# Patient Record
Sex: Female | Born: 1935 | Race: White | Hispanic: No | Smoking: Never smoker
Health system: Southern US, Community
[De-identification: ages and names within clinical notes are randomized; demographics above are authoritative.]

## PROBLEM LIST (undated history)

## (undated) DIAGNOSIS — I10 Essential (primary) hypertension: Secondary | ICD-10-CM

## (undated) DIAGNOSIS — F329 Major depressive disorder, single episode, unspecified: Secondary | ICD-10-CM

## (undated) DIAGNOSIS — K529 Noninfective gastroenteritis and colitis, unspecified: Secondary | ICD-10-CM

## (undated) DIAGNOSIS — E785 Hyperlipidemia, unspecified: Secondary | ICD-10-CM

## (undated) DIAGNOSIS — F0390 Unspecified dementia without behavioral disturbance: Secondary | ICD-10-CM

## (undated) DIAGNOSIS — M1990 Unspecified osteoarthritis, unspecified site: Secondary | ICD-10-CM

## (undated) DIAGNOSIS — Z7901 Long term (current) use of anticoagulants: Secondary | ICD-10-CM

## (undated) DIAGNOSIS — K219 Gastro-esophageal reflux disease without esophagitis: Secondary | ICD-10-CM

## (undated) DIAGNOSIS — K449 Diaphragmatic hernia without obstruction or gangrene: Secondary | ICD-10-CM

## (undated) DIAGNOSIS — I313 Pericardial effusion (noninflammatory): Secondary | ICD-10-CM

## (undated) DIAGNOSIS — I3139 Other pericardial effusion (noninflammatory): Secondary | ICD-10-CM

## (undated) DIAGNOSIS — M549 Dorsalgia, unspecified: Secondary | ICD-10-CM

## (undated) DIAGNOSIS — Z8781 Personal history of (healed) traumatic fracture: Secondary | ICD-10-CM

## (undated) DIAGNOSIS — T07XXXA Unspecified multiple injuries, initial encounter: Secondary | ICD-10-CM

## (undated) DIAGNOSIS — I4891 Unspecified atrial fibrillation: Secondary | ICD-10-CM

## (undated) DIAGNOSIS — E079 Disorder of thyroid, unspecified: Secondary | ICD-10-CM

## (undated) DIAGNOSIS — J189 Pneumonia, unspecified organism: Secondary | ICD-10-CM

## (undated) DIAGNOSIS — R55 Syncope and collapse: Secondary | ICD-10-CM

## (undated) DIAGNOSIS — F32A Depression, unspecified: Secondary | ICD-10-CM

## (undated) HISTORY — DX: Unspecified multiple injuries, initial encounter: T07.XXXA

## (undated) HISTORY — DX: Pneumonia, unspecified organism: J18.9

## (undated) HISTORY — DX: Diaphragmatic hernia without obstruction or gangrene: K44.9

## (undated) HISTORY — DX: Noninfective gastroenteritis and colitis, unspecified: K52.9

## (undated) HISTORY — DX: Hyperlipidemia, unspecified: E78.5

## (undated) HISTORY — PX: ABDOMINAL HYSTERECTOMY: SHX81

## (undated) HISTORY — PX: APPENDECTOMY: SHX54

## (undated) HISTORY — DX: Long term (current) use of anticoagulants: Z79.01

## (undated) HISTORY — DX: Gastro-esophageal reflux disease without esophagitis: K21.9

---

## 2003-02-22 HISTORY — PX: COLONOSCOPY: SHX174

## 2009-01-06 ENCOUNTER — Ambulatory Visit (HOSPITAL_COMMUNITY): Admission: RE | Admit: 2009-01-06 | Discharge: 2009-01-06 | Payer: Self-pay | Admitting: Internal Medicine

## 2011-09-22 DIAGNOSIS — K529 Noninfective gastroenteritis and colitis, unspecified: Secondary | ICD-10-CM

## 2011-09-22 DIAGNOSIS — J189 Pneumonia, unspecified organism: Secondary | ICD-10-CM

## 2011-09-22 HISTORY — DX: Pneumonia, unspecified organism: J18.9

## 2011-09-22 HISTORY — DX: Noninfective gastroenteritis and colitis, unspecified: K52.9

## 2011-09-27 ENCOUNTER — Inpatient Hospital Stay (HOSPITAL_COMMUNITY)
Admission: EM | Admit: 2011-09-27 | Discharge: 2011-10-04 | DRG: 871 | Disposition: A | Discharge status: Home / Self Care | Payer: Medicare Other | Attending: Internal Medicine | Admitting: Internal Medicine

## 2011-09-27 ENCOUNTER — Emergency Department (HOSPITAL_COMMUNITY): Payer: Medicare Other

## 2011-09-27 ENCOUNTER — Encounter (HOSPITAL_COMMUNITY): Payer: Self-pay | Admitting: *Deleted

## 2011-09-27 ENCOUNTER — Inpatient Hospital Stay (HOSPITAL_COMMUNITY)
Admission: EM | Admit: 2011-09-27 | Payer: Medicare Other | Source: Other Acute Inpatient Hospital | Admitting: Internal Medicine

## 2011-09-27 DIAGNOSIS — E872 Acidosis, unspecified: Secondary | ICD-10-CM

## 2011-09-27 DIAGNOSIS — I959 Hypotension, unspecified: Secondary | ICD-10-CM

## 2011-09-27 DIAGNOSIS — D689 Coagulation defect, unspecified: Secondary | ICD-10-CM

## 2011-09-27 DIAGNOSIS — T45515A Adverse effect of anticoagulants, initial encounter: Secondary | ICD-10-CM | POA: Diagnosis present

## 2011-09-27 DIAGNOSIS — I4891 Unspecified atrial fibrillation: Secondary | ICD-10-CM

## 2011-09-27 DIAGNOSIS — R197 Diarrhea, unspecified: Secondary | ICD-10-CM | POA: Diagnosis present

## 2011-09-27 DIAGNOSIS — I10 Essential (primary) hypertension: Secondary | ICD-10-CM | POA: Diagnosis present

## 2011-09-27 DIAGNOSIS — E876 Hypokalemia: Secondary | ICD-10-CM | POA: Diagnosis present

## 2011-09-27 DIAGNOSIS — G9340 Encephalopathy, unspecified: Secondary | ICD-10-CM

## 2011-09-27 DIAGNOSIS — Z7901 Long term (current) use of anticoagulants: Secondary | ICD-10-CM

## 2011-09-27 DIAGNOSIS — Z23 Encounter for immunization: Secondary | ICD-10-CM

## 2011-09-27 DIAGNOSIS — K5900 Constipation, unspecified: Secondary | ICD-10-CM | POA: Diagnosis present

## 2011-09-27 DIAGNOSIS — K5289 Other specified noninfective gastroenteritis and colitis: Secondary | ICD-10-CM | POA: Diagnosis present

## 2011-09-27 DIAGNOSIS — R6510 Systemic inflammatory response syndrome (SIRS) of non-infectious origin without acute organ dysfunction: Secondary | ICD-10-CM

## 2011-09-27 DIAGNOSIS — R578 Other shock: Secondary | ICD-10-CM | POA: Diagnosis present

## 2011-09-27 DIAGNOSIS — K529 Noninfective gastroenteritis and colitis, unspecified: Secondary | ICD-10-CM | POA: Diagnosis present

## 2011-09-27 DIAGNOSIS — E039 Hypothyroidism, unspecified: Secondary | ICD-10-CM | POA: Diagnosis present

## 2011-09-27 DIAGNOSIS — R109 Unspecified abdominal pain: Secondary | ICD-10-CM

## 2011-09-27 DIAGNOSIS — R6521 Severe sepsis with septic shock: Secondary | ICD-10-CM

## 2011-09-27 DIAGNOSIS — E860 Dehydration: Secondary | ICD-10-CM

## 2011-09-27 DIAGNOSIS — A419 Sepsis, unspecified organism: Principal | ICD-10-CM | POA: Diagnosis present

## 2011-09-27 HISTORY — DX: Essential (primary) hypertension: I10

## 2011-09-27 HISTORY — DX: Unspecified osteoarthritis, unspecified site: M19.90

## 2011-09-27 HISTORY — DX: Unspecified atrial fibrillation: I48.91

## 2011-09-27 LAB — CBC WITH DIFFERENTIAL/PLATELET
Basophils Relative: 0 % (ref 0–1)
Eosinophils Relative: 1 % (ref 0–5)
HCT: 37.7 % (ref 36.0–46.0)
Hemoglobin: 12.9 g/dL (ref 12.0–15.0)
Lymphocytes Relative: 13 % (ref 12–46)
MCHC: 34.2 g/dL (ref 30.0–36.0)
Monocytes Relative: 2 % — ABNORMAL LOW (ref 3–12)
Neutro Abs: 12.6 10*3/uL — ABNORMAL HIGH (ref 1.7–7.7)
RBC: 4.1 MIL/uL (ref 3.87–5.11)
WBC: 14.9 10*3/uL — ABNORMAL HIGH (ref 4.0–10.5)

## 2011-09-27 LAB — BLOOD GAS, ARTERIAL
TCO2: 14.7 mmol/L (ref 0–100)
pCO2 arterial: 32.3 mmHg — ABNORMAL LOW (ref 35.0–45.0)
pH, Arterial: 7.319 — ABNORMAL LOW (ref 7.350–7.450)
pO2, Arterial: 231 mmHg — ABNORMAL HIGH (ref 80.0–100.0)

## 2011-09-27 LAB — COMPREHENSIVE METABOLIC PANEL
ALT: 15 U/L (ref 0–35)
AST: 23 U/L (ref 0–37)
Albumin: 3.3 g/dL — ABNORMAL LOW (ref 3.5–5.2)
CO2: 17 mEq/L — ABNORMAL LOW (ref 19–32)
Chloride: 104 mEq/L (ref 96–112)
GFR calc non Af Amer: 56 mL/min — ABNORMAL LOW (ref >90)
Sodium: 138 mEq/L (ref 135–145)
Total Bilirubin: 0.4 mg/dL (ref 0.3–1.2)

## 2011-09-27 LAB — URINALYSIS, ROUTINE W REFLEX MICROSCOPIC
Bilirubin Urine: NEGATIVE
Hgb urine dipstick: NEGATIVE
Nitrite: NEGATIVE
Specific Gravity, Urine: 1.02 (ref 1.005–1.030)
Urobilinogen, UA: 0.2 mg/dL (ref 0.0–1.0)
pH: 7 (ref 5.0–8.0)

## 2011-09-27 LAB — PROCALCITONIN: Procalcitonin: <0.1 ng/mL

## 2011-09-27 LAB — URINE MICROSCOPIC-ADD ON

## 2011-09-27 LAB — PROTIME-INR
INR: 2.11 — ABNORMAL HIGH (ref 0.00–1.49)
Prothrombin Time: 24 seconds — ABNORMAL HIGH (ref 11.6–15.2)

## 2011-09-27 LAB — TROPONIN I: Troponin I: <0.3 ng/mL (ref <0.30)

## 2011-09-27 LAB — TYPE AND SCREEN

## 2011-09-27 MED ORDER — POTASSIUM CHLORIDE 10 MEQ/100ML IV SOLN
10.0000 meq | Freq: Once | INTRAVENOUS | Status: AC
  Administered 2011-09-27: 10 meq via INTRAVENOUS
  Filled 2011-09-27: qty 100

## 2011-09-27 MED ORDER — SODIUM CHLORIDE 0.9 % IV BOLUS (SEPSIS)
1000.0000 mL | Freq: Once | INTRAVENOUS | Status: AC
  Administered 2011-09-27: 1000 mL via INTRAVENOUS

## 2011-09-27 MED ORDER — PIPERACILLIN-TAZOBACTAM 3.375 G IVPB
3.3750 g | Freq: Once | INTRAVENOUS | Status: AC
  Administered 2011-09-27: 3.375 g via INTRAVENOUS
  Filled 2011-09-27: qty 50

## 2011-09-27 MED ORDER — ONDANSETRON HCL 4 MG/2ML IJ SOLN
4.0000 mg | Freq: Once | INTRAMUSCULAR | Status: AC
  Administered 2011-09-27: 4 mg via INTRAVENOUS
  Filled 2011-09-27: qty 2

## 2011-09-27 MED ORDER — POTASSIUM CHLORIDE CRYS ER 20 MEQ PO TBCR
40.0000 meq | EXTENDED_RELEASE_TABLET | Freq: Once | ORAL | Status: DC
  Filled 2011-09-27: qty 2

## 2011-09-27 MED ORDER — FENTANYL CITRATE 0.05 MG/ML IJ SOLN
50.0000 ug | Freq: Once | INTRAMUSCULAR | Status: AC
  Administered 2011-09-27: 50 ug via INTRAVENOUS

## 2011-09-27 MED ORDER — ONDANSETRON HCL 4 MG/2ML IJ SOLN
INTRAMUSCULAR | Status: AC
  Filled 2011-09-27: qty 2

## 2011-09-27 MED ORDER — FENTANYL CITRATE 0.05 MG/ML IJ SOLN
INTRAMUSCULAR | Status: AC
  Administered 2011-09-27: 50 ug via INTRAVENOUS
  Filled 2011-09-27: qty 2

## 2011-09-27 MED ORDER — VANCOMYCIN HCL IN DEXTROSE 1-5 GM/200ML-% IV SOLN
1000.0000 mg | Freq: Once | INTRAVENOUS | Status: AC
  Administered 2011-09-27: 1000 mg via INTRAVENOUS
  Filled 2011-09-27: qty 200

## 2011-09-27 NOTE — ED Notes (Signed)
Patient expresses desire to not be placed on life support in the event of a code. Family members state she has a living will, still no urine output.

## 2011-09-27 NOTE — ED Notes (Signed)
Family members at bedside now, patient is calmer at the moment

## 2011-09-27 NOTE — ED Provider Notes (Signed)
History  This chart was scribed for Glynn Octave, MD by Erskine Emery. This patient was seen in room APA02/APA02 and the patient's care was started at 21:42.    CSN: [id number]  Arrival date & time 09/27/11  2131   None    Level 5 Caveat--Mental Status  Chief Complaint  Patient presents with  . Abdominal Pain    (Consider location/radiation/quality/duration/timing/severity/associated sxs/prior treatment) HPI Level 5 Caveat--Mental Status Tamara [name] is a 78 y.o. female brought in by ambulance, who presents to the Emergency Department complaining of sudden onset emesis, diarrhea, lower abdominal pain and low blood pressure. Pt denies any associated back pain. EMS reports her blood pressure was low upon arrival, about 70/50 but raised to 80/60 after 500 cc bolus fluids. Pt claims she has never had pain like this before. Pt was given no pain medication PTA. Pt has a h/o hysterectomy but no other surgeries or medical issues that we know of. Pt reports she does take Coumadin but does not take Aspirin. Dr. Margo Aye is the pt's PCP.    Past Medical History  Diagnosis Date  . Hypertension   . Atrial fibrillation     Past Surgical History  Procedure Date  . Abdominal hysterectomy     No family history on file.  History  Substance Use Topics  . Smoking status: Unknown If Ever Smoked  . Smokeless tobacco: Not on file  . Alcohol Use: No    OB History    Grav Para Term Preterm Abortions TAB SAB Ect Mult Living                  Review of Systems A complete 10 system review of systems was obtained and all systems are negative except as noted in the HPI and PMH.    Allergies  Review of patient's allergies indicates no known allergies.  Home Medications  No current outpatient prescriptions on file.  BP 110/51  Pulse 50  Temp 97.9 F (36.6 C) (Oral)  Resp 16  SpO2 100%  Physical Exam  Nursing note and vitals reviewed. Constitutional: She is oriented to person,  place, and time. She appears well-developed and well-nourished. She appears distressed.       Pt is moaning and uncomfortable, unable to provide history.  HENT:  Head: Normocephalic and atraumatic.  Eyes: EOM are normal.  Neck: Neck supple. No tracheal deviation present.  Cardiovascular: Normal rate.   Pulmonary/Chest: Effort normal and breath sounds normal. No respiratory distress. She has no wheezes. She has no rales.  Abdominal: Soft. She exhibits no distension. There is tenderness. There is guarding.       Distensed abdomen. Diffusely tender with guarding, worse in the lower quadrant. No obvious abdominal aortic aneurysm on ultrasound.   Musculoskeletal: She exhibits no edema.       Equal femoral pulses bilaterally. +2 dorsalis pedis pulses bilaterally.  Neurological: She is alert and oriented to person, place, and time.  Skin: Skin is warm and dry.  Psychiatric: She has a normal mood and affect.    ED Course  CENTRAL LINE Date/Time: 09/27/2011 11:56 PM Performed by: Glynn Octave Authorized by: Glynn Octave Consent: Verbal consent obtained. The procedure was performed in an emergent situation. Consent given by: patient and power of attorney Patient understanding: patient states understanding of the procedure being performed Patient consent: the patient's understanding of the procedure matches consent given Patient identity confirmed: verbally with patient Time out: Immediately prior to procedure a "time out" was called  to verify the correct patient, procedure, equipment, support staff and site/side marked as required. Indications: vascular access and central pressure monitoring Anesthesia: local infiltration Local anesthetic: lidocaine 1% without epinephrine Anesthetic total: 4 ml Patient sedated: no Preparation: skin prepped with 2% chlorhexidine Skin prep agent dried: skin prep agent completely dried prior to procedure Sterile barriers: all five maximum sterile barriers  used - cap, mask, sterile gown, sterile gloves, and large sterile sheet Hand hygiene: hand hygiene performed prior to central venous catheter insertion Location details: right internal jugular Patient position: Trendelenburg Catheter type: triple lumen Catheter size: 7.5 Fr Pre-procedure: landmarks identified Ultrasound guidance: yes Number of attempts: 1 Successful placement: yes Post-procedure: line sutured and dressing applied Assessment: blood return through all parts, free fluid flow and placement verified by x-ray Patient tolerance: Patient tolerated the procedure well with no immediate complications.   (including critical care time) DIAGNOSTIC STUDIES: Oxygen Saturation is 93% on room air, adequate by my interpretation.    COORDINATION OF CARE: 21:42--I evaluated the patient.  Labs Reviewed  CBC WITH DIFFERENTIAL - Abnormal; Notable for the following:    WBC 14.9 (*)     Neutrophils Relative 84 (*)     Monocytes Relative 2 (*)     Neutro Abs 12.6 (*)     All other components within normal limits  COMPREHENSIVE METABOLIC PANEL - Abnormal; Notable for the following:    Potassium 2.6 (*)     CO2 17 (*)     Glucose, Bld 232 (*)     Total Protein 5.6 (*)     Albumin 3.3 (*)     GFR calc non Af Amer 56 (*)     GFR calc Af Amer 64 (*)     All other components within normal limits  LACTIC ACID, PLASMA - Abnormal; Notable for the following:    Lactic Acid, Venous 5.7 (*)     All other components within normal limits  URINALYSIS, ROUTINE W REFLEX MICROSCOPIC - Abnormal; Notable for the following:    Glucose, UA 100 (*)     Protein, ur 100 (*)     All other components within normal limits  PROTIME-INR - Abnormal; Notable for the following:    Prothrombin Time 24.0 (*)     INR 2.11 (*)     All other components within normal limits  BLOOD GAS, ARTERIAL - Abnormal; Notable for the following:    pH, Arterial 7.319 (*)     pCO2 arterial 32.3 (*)     pO2, Arterial 231.0 (*)      Bicarbonate 16.1 (*)     Acid-base deficit 8.8 (*)     All other components within normal limits  URINE MICROSCOPIC-ADD ON - Abnormal; Notable for the following:    Squamous Epithelial / LPF FEW (*)     Bacteria, UA FEW (*)     Casts HYALINE CASTS (*)     All other components within normal limits  LIPASE, BLOOD  TROPONIN I  PROCALCITONIN  TYPE AND SCREEN  KETONES, QUALITATIVE  CULTURE, BLOOD (ROUTINE X 2)  CULTURE, BLOOD (ROUTINE X 2)  MRSA PCR SCREENING  LACTIC ACID, PLASMA  LACTIC ACID, PLASMA  LACTIC ACID, PLASMA   Ct Abdomen Pelvis Wo Contrast  09/27/2011  *RADIOLOGY REPORT*  Clinical Data: Severe abdominal pain.  Low blood pressure.  CT ABDOMEN AND PELVIS WITHOUT CONTRAST  Technique:  Multidetector CT imaging of the abdomen and pelvis was performed following the standard protocol without intravenous contrast.  Comparison:  None.  Findings: Minimal bilateral pleural effusions with atelectasis or infiltration in the lung bases.  Cardiac enlargement.  Coronary artery calcification.  Small esophageal hiatal hernia.  The unenhanced appearance of the liver, spleen, gallbladder, left adrenal gland, kidneys, and retroperitoneal lymph nodes is unremarkable.  There is a hypodense nodule in the right adrenal gland measuring 1.8 cm diameter consistent with an adenoma. Calcification of the abdominal aorta without aneurysm.  No retroperitoneal hematomas.  Diffusely stool filled colon without significant distension or wall thickening. 6 mm fat density lesion in the second portion of the duodenum consistent with lipoma.  The stomach and small bowel are not abnormally distended.  No free air or free fluid in the abdomen.  Prominent visceral adipose tissues.  Pelvis:  A the uterus appears to be surgically absent.  There is a right-sided pelvic mass measuring 3.2 x 4.2 cm, likely representing the right ovary.  This is a moderately enlarged for age and ultrasound should be considered for followup.  The  bladder is decompressed with Foley catheter.  No free air or free fluid in the abdomen.  No significant pelvic lymphadenopathy.  The appendix is not visualized.  Degenerative changes in the lumbar spine.  IMPRESSION:  Small esophageal hiatal hernia.  Minimal bilateral pleural effusions with basilar atelectasis or infiltration.  Right adrenal gland nodule.  Prominence of the right ovary.  Consider ultrasound for follow-up.  Diffusely stool filled colon without bowel obstruction.  Duodenal lipoma.  Original Report Authenticated By: Marlon Pel, M.D.   Dg Chest Portable 1 View  09/28/2011  *RADIOLOGY REPORT*  Clinical Data: Right central line placement.  PORTABLE CHEST - 1 VIEW  Comparison: None.  Findings: The right central venous catheter placed with tip over the low right atrium.  If positioning as above the SVC / RA junction is desired, the catheter could be withdrawn about 7.7 cm. No pneumothorax.  Shallow inspiration.  Mild cardiac enlargement with normal pulmonary vascularity.  Interstitial changes suggesting fibrosis. Calcified and tortuous aorta.  No blunting of costophrenic angles. Mediastinal contours appear intact.  IMPRESSION: Right central venous catheter placed with tip over the low right atrium, about 7.7 cm below the SVC / RA junction.  No pneumothorax.  Original Report Authenticated By: Marlon Pel, M.D.   Dg Abd Acute W/chest  09/27/2011  *RADIOLOGY REPORT*  Clinical Data: Abdominal pain, shortness of breath  ACUTE ABDOMEN SERIES (ABDOMEN 2 VIEW & CHEST 1 VIEW)  Comparison: None.  Findings: Mild cardiomegaly with vascular congestion and chronic interstitial changes.  Negative for CHF or pneumonia.  No effusion or pneumothorax.  no definite free air.  Nonobstructive bowel gas pattern.  There is stool distally in the sigmoid region.  Mild nonspecific gaseous distention in the right abdomen.  Limited exam because of positioning.  Degenerative changes of the spine and hips.  Pelvic  calcifications consistent with venous phleboliths. Calcification over the right iliac crest noted, nonspecific this could be an injection granuloma versus an appendicolith.  IMPRESSION: Cardiomegaly without CHF or pneumonia  Negative for obstruction or free air.  Original Report Authenticated By: Judie Petit. Ruel Favors, M.D.     1. Septic shock       MDM  Arrives via EMS from home with acute onset of abdominal pain, vomiting. TTP LLQ with diffuse guarding. FOBT neg. Aggressive IVF, labs, plain films to eval for free air.  No bowel perforation or free air seen on plain film. No AAA seen on bedside ultrasound. Distal pulses intact bilaterally.  Abdominal pain, hypotension with lactic acidosis. Concern for mesenteric ischemia, septic shock, microperforation, diverticulitis. Broad spectrum antibiotics begun and cultures obtained.  Family arrives and confirms patient has living will and is DNR.  No intubation or CPR.  Patient agrees with family. Chronic findings on CT scan (no contrast because obtained emergently) D/w critical care who accepts patient in transfer. Request CVL placed before transfer and continue fluid resuscitation up to six liters.   Date: 09/27/2011  Rate: 59  Rhythm: normal sinus rhythm and premature ventricular contractions (PVC)  QRS Axis: normal  Intervals: normal  ST/T Wave abnormalities: nonspecific ST/T changes  Conduction Disutrbances:none  Narrative Interpretation: P waves evident with dropped QRS complexes in pattern of 2nd degree AV block.  Old EKG Reviewed: none available   Date: 09/28/2011  Rate: 54  Rhythm: atrial fibrillation  QRS Axis: normal  Intervals: normal  ST/T Wave abnormalities: nonspecific ST/T changes  Conduction Disutrbances:none  Narrative Interpretation: now atrial fibrillation  Old EKG Reviewed: changes noted  EMERGENCY DEPARTMENT [place] ABD/AORTA EXAM Study: Limited Ultrasound of the Abdominal Aorta.  INDICATIONS:Hypotension, Unstable Vital  Signs, Abdominal pain and Age>55 Indication: Multiple views of the abdominal aorta are obtained from the diaphragmatic hiatus to the aortic bifurcation in transverse and sagittal planes with a multi- Frequency probe.  PERFORMED BY: Myself  IMAGES ARCHIVED?: No  FINDINGS: Free fluid absent  LIMITATIONS:  Bowel gas and Abdominal pain  INTERPRETATION:  No abdominal aortic aneurysm  COMMENT:  Limited by pain CRITICAL CARE Performed by: Glynn Octave   Total critical care time: 60  Critical care time was exclusive of separately billable procedures and treating other patients.  Critical care was necessary to treat or prevent imminent or life-threatening deterioration.  Critical care was time spent personally by me on the following activities: development of treatment plan with patient and/or surrogate as well as nursing, discussions with consultants, evaluation of patient's response to treatment, examination of patient, obtaining history from patient or surrogate, ordering and performing treatments and interventions, ordering and review of laboratory studies, ordering and review of radiographic studies, pulse oximetry and re-evaluation of patient's condition.  I personally performed the services described in this documentation, which was scribed in my presence.  The recorded information has been reviewed and considered.    Glynn Octave, MD 09/28/11 1018

## 2011-09-27 NOTE — ED Notes (Signed)
2nd set of blood cultures drawn 

## 2011-09-27 NOTE — Progress Notes (Signed)
Notified md of pt abg p02 of 231and  Sat of 99.3 on nrb mask

## 2011-09-28 ENCOUNTER — Encounter (HOSPITAL_COMMUNITY): Payer: Self-pay

## 2011-09-28 ENCOUNTER — Emergency Department (HOSPITAL_COMMUNITY): Payer: Medicare Other

## 2011-09-28 ENCOUNTER — Inpatient Hospital Stay (HOSPITAL_COMMUNITY): Payer: Medicare Other

## 2011-09-28 DIAGNOSIS — I959 Hypotension, unspecified: Secondary | ICD-10-CM

## 2011-09-28 DIAGNOSIS — R6510 Systemic inflammatory response syndrome (SIRS) of non-infectious origin without acute organ dysfunction: Secondary | ICD-10-CM

## 2011-09-28 DIAGNOSIS — R109 Unspecified abdominal pain: Secondary | ICD-10-CM

## 2011-09-28 DIAGNOSIS — I4891 Unspecified atrial fibrillation: Secondary | ICD-10-CM

## 2011-09-28 DIAGNOSIS — R6521 Severe sepsis with septic shock: Secondary | ICD-10-CM

## 2011-09-28 DIAGNOSIS — A419 Sepsis, unspecified organism: Principal | ICD-10-CM

## 2011-09-28 DIAGNOSIS — G9340 Encephalopathy, unspecified: Secondary | ICD-10-CM

## 2011-09-28 DIAGNOSIS — E872 Acidosis: Secondary | ICD-10-CM

## 2011-09-28 DIAGNOSIS — K529 Noninfective gastroenteritis and colitis, unspecified: Secondary | ICD-10-CM | POA: Diagnosis present

## 2011-09-28 DIAGNOSIS — E860 Dehydration: Secondary | ICD-10-CM

## 2011-09-28 LAB — CBC WITH DIFFERENTIAL/PLATELET
Basophils Relative: 0 % (ref 0–1)
Eosinophils Relative: 0 % (ref 0–5)
HCT: 38.9 % (ref 36.0–46.0)
Hemoglobin: 13.4 g/dL (ref 12.0–15.0)
Lymphocytes Relative: 2 % — ABNORMAL LOW (ref 12–46)
MCH: 31.5 pg (ref 26.0–34.0)
Neutro Abs: 14.1 10*3/uL — ABNORMAL HIGH (ref 1.7–7.7)
Neutrophils Relative %: 93 % — ABNORMAL HIGH (ref 43–77)
RBC: 4.26 MIL/uL (ref 3.87–5.11)

## 2011-09-28 LAB — BASIC METABOLIC PANEL
BUN: 19 mg/dL (ref 6–23)
CO2: 20 mEq/L (ref 19–32)
Calcium: 9.1 mg/dL (ref 8.4–10.5)
Chloride: 104 mEq/L (ref 96–112)
Creatinine, Ser: 0.83 mg/dL (ref 0.50–1.10)
GFR calc non Af Amer: 63 mL/min — ABNORMAL LOW (ref >90)
GFR calc non Af Amer: 75 mL/min — ABNORMAL LOW (ref >90)
Glucose, Bld: 214 mg/dL — ABNORMAL HIGH (ref 70–99)
Glucose, Bld: 99 mg/dL (ref 70–99)
Potassium: 3.5 mEq/L (ref 3.5–5.1)
Sodium: 141 mEq/L (ref 135–145)
Sodium: 138 mEq/L (ref 135–145)

## 2011-09-28 LAB — OCCULT BLOOD, POC DEVICE: Fecal Occult Bld: NEGATIVE

## 2011-09-28 LAB — GLUCOSE, CAPILLARY

## 2011-09-28 LAB — KETONES, QUALITATIVE: Acetone, Bld: NEGATIVE

## 2011-09-28 MED ORDER — FENTANYL CITRATE 0.05 MG/ML IJ SOLN
25.0000 ug | INTRAMUSCULAR | Status: DC | PRN
  Administered 2011-09-28 (×3): 50 ug via INTRAVENOUS
  Filled 2011-09-28: qty 2

## 2011-09-28 MED ORDER — PANTOPRAZOLE SODIUM 40 MG IV SOLR
40.0000 mg | Freq: Every day | INTRAVENOUS | Status: DC
  Administered 2011-09-28 – 2011-09-29 (×3): 40 mg via INTRAVENOUS
  Filled 2011-09-28 (×4): qty 40

## 2011-09-28 MED ORDER — SODIUM CHLORIDE 0.9 % IJ SOLN
10.0000 mL | Freq: Two times a day (BID) | INTRAMUSCULAR | Status: DC
  Administered 2011-09-29 (×2): 10 mL
  Administered 2011-10-03: 3 mL

## 2011-09-28 MED ORDER — POTASSIUM CHLORIDE 10 MEQ/50ML IV SOLN
10.0000 meq | INTRAVENOUS | Status: AC
  Administered 2011-09-28 (×5): 10 meq via INTRAVENOUS
  Filled 2011-09-28 (×5): qty 50

## 2011-09-28 MED ORDER — SODIUM CHLORIDE 0.9 % IV SOLN
INTRAVENOUS | Status: DC
  Administered 2011-09-28: 1000 mL via INTRAVENOUS
  Administered 2011-09-29: 01:00:00 via INTRAVENOUS
  Administered 2011-09-29: 20 mL/h via INTRAVENOUS
  Administered 2011-09-30: 14:00:00 via INTRAVENOUS

## 2011-09-28 MED ORDER — ONDANSETRON HCL 4 MG/2ML IJ SOLN
4.0000 mg | Freq: Four times a day (QID) | INTRAMUSCULAR | Status: DC | PRN
  Administered 2011-09-28: 4 mg via INTRAVENOUS
  Filled 2011-09-28: qty 2

## 2011-09-28 MED ORDER — POTASSIUM CHLORIDE 10 MEQ/50ML IV SOLN
10.0000 meq | Freq: Once | INTRAVENOUS | Status: AC
  Administered 2011-09-29: 10 meq via INTRAVENOUS
  Filled 2011-09-28: qty 50

## 2011-09-28 MED ORDER — PIPERACILLIN-TAZOBACTAM 3.375 G IVPB
3.3750 g | Freq: Three times a day (TID) | INTRAVENOUS | Status: DC
  Administered 2011-09-28 – 2011-10-02 (×12): 3.375 g via INTRAVENOUS
  Filled 2011-09-28 (×17): qty 50

## 2011-09-28 MED ORDER — SODIUM CHLORIDE 0.9 % IJ SOLN
10.0000 mL | INTRAMUSCULAR | Status: DC | PRN
  Administered 2011-09-28: 10 mL

## 2011-09-28 MED ORDER — POTASSIUM CHLORIDE 10 MEQ/50ML IV SOLN
10.0000 meq | INTRAVENOUS | Status: AC
  Administered 2011-09-28 (×2): 10 meq via INTRAVENOUS
  Filled 2011-09-28 (×4): qty 50

## 2011-09-28 MED ORDER — IOHEXOL 300 MG/ML  SOLN
20.0000 mL | INTRAMUSCULAR | Status: AC
  Administered 2011-09-28 (×2): 20 mL via ORAL

## 2011-09-28 MED ORDER — SODIUM CHLORIDE 0.9 % IV SOLN
400.0000 mg | Freq: Once | INTRAVENOUS | Status: AC
  Administered 2011-09-28: 400 mg via INTRAVENOUS
  Filled 2011-09-28 (×2): qty 4

## 2011-09-28 NOTE — H&P (Signed)
Name: Tamara [name] MRN: [id number] DOB: [birth year]    LOS: 1  Referring Provider:  [facility] Reason for Referral:  Suspected sepsis  PULMONARY / CRITICAL CARE MEDICINE  The patient is encephalopathic and unable to provide history, which was obtained for available medical records.  HPI:  78 yo with sudden onset emesis, diarrhea, lower abdominal pain and hypotension who initially presented to [facility] ED and was transferred to [facility] for further management.  Past Medical History  Diagnosis Date  . Hypertension   . Atrial fibrillation    Past Surgical History  Procedure Date  . Abdominal hysterectomy    Prior to Admission medications   Medication Sig Start Date End Date Taking? Authorizing Provider  acetaminophen (ARTHRITIS PAIN RELIEF) 650 MG CR tablet Take 650 mg by mouth daily as needed. For arthritis   Yes Historical Provider, MD  alendronate (FOSAMAX) 70 MG tablet Take 70 mg by mouth every 7 (seven) days. Take with a full glass of water on an empty stomach for the bones   Yes Historical Provider, MD  Calcium Carbonate-Vitamin D (CALCIUM 600 + D PO) Take 1 tablet by mouth daily. For bone health   Yes Historical Provider, MD  cetirizine (ZYRTEC) 10 MG tablet Take 10 mg by mouth daily. For allergies   Yes Historical Provider, MD  hydrochlorothiazide (HYDRODIURIL) 25 MG tablet Take 25 mg by mouth daily. For blood pressure   Yes Historical Provider, MD  levothyroxine (SYNTHROID, LEVOTHROID) 88 MCG tablet Take 88 mcg by mouth daily. For thyroid   Yes Historical Provider, MD  lisinopril (PRINIVIL,ZESTRIL) 10 MG tablet Take 10 mg by mouth daily. For blood pressure   Yes Historical Provider, MD  memantine (NAMENDA) 10 MG tablet Take 20 mg by mouth daily. For memory   Yes Historical Provider, MD  omeprazole (PRILOSEC) 40 MG capsule Take 40 mg by mouth daily. For heartburn   Yes Historical Provider, MD  polyethylene glycol powder (GLYCOLAX/MIRALAX) powder Take 17 g by mouth daily. For constipation    Yes Historical Provider, MD  potassium chloride SA (K-DUR,KLOR-CON) 20 MEQ tablet Take 40 mEq by mouth daily.   Yes Historical Provider, MD  simvastatin (ZOCOR) 40 MG tablet Take 40 mg by mouth at bedtime. For cholesterol   Yes Historical Provider, MD  verapamil (CALAN-SR) 240 MG CR tablet Take 240 mg by mouth 2 (two) times daily. For blood pressure   Yes Historical Provider, MD  warfarin (COUMADIN) 5 MG tablet Take 5 mg by mouth as directed. For A.FIB   Yes Historical Provider, MD   Allergies No Known Allergies  Family History No family history on file.  Social History  has an unknown smoking status. She does not have any smokeless tobacco history on file. She reports that she does not drink alcohol. Her drug history not on file.  Review Of Systems:  Unable to provide  Brief patient description:  78 yo with sudden onset emesis, diarrhea, lower abdominal pain and hypotension who initially presented to [facility] ED and was transferred to [facility] for further management.  Events Since Admission: 8/6 Presented to [facility] with sudden onset emesis, diarrhea, lower abdominal pain, transferred to [facility]  Current Status:  Vital Signs: Temp:  [97.9 F (36.6 C)] 97.9 F (36.6 C) (08/07 0138) Pulse Rate:  [33-83] 50  (08/07 0006) Resp:  [16-25] 16  (08/07 0006) BP: (91-116)/(37-66) 110/51 mmHg (08/07 0006) SpO2:  [93 %-100 %] 100 % (08/07 0006)  Physical Examination: General:  Somewhat uncomfortable Neuro:  Confused but cooperative, non focal, follows commands HEENT:  PERRL, dry membranes Neck:  Supple, no JVD Cardiovascular:  Irregular, controlled rate Lungs:  CTAB Abdomen:  Voluntary guarding, generalized tenderness, no rebound Musculoskeletal:  No edema, moves all extremities Skin:  Intact  Active Problems:  Hypotension  Dehydration  Abdominal pain  Atrial fibrillation  Coagulopathy  SIRS (systemic inflammatory response syndrome)  Encephalopathy acute  ASSESSMENT AND PLAN  PULMONARY  Lab  09/27/11 2228  PHART 7.319*  PCO2ART 32.3*  PO2ART 231.0*  HCO3 16.1*  O2SAT 99.3   Ventilator Settings:   CXR:  8/7 >>> Right central venous catheter placed with tip over the low right atrium, about 7.7 cm below the SVC / RA junction. No pneumothorax.  ETT:  NA  A:  No active issues. P:   Supplemental oxygen PRN Goal SpO2>92  CARDIOVASCULAR  Lab 09/27/11 2146  TROPONINI <0.30  LATICACIDVEN 5.7*  PROBNP --   ECG:  8/6 >>> Atrial fibrillation, controlled rate, nonspecific ST-T changes Lines: R IJ CVL 8/6 >>>  A: Hypotension on the background of volume depletion (emesis, diarrhea, diuretics, antihypertensives).  SIRS.  No evidence af acute ischemia.  Atrial fibrillation / controlled rate. P:  Hold Lisinopril / Verapamil IVF boluses Goal CVP 8-10 Trend Lactate  RENAL  Lab 09/27/11 2146  NA 138  K 2.6*  CL 104  CO2 17*  BUN 18  CREATININE 0.92  CALCIUM 10.0  MG --  PHOS --   Intake/Output    None    Foley:  8/6 >>>  A:  Hypokalemia.  Metabolic acidosis, mixed - lactic + bicarbonate loss.  Hypovolemia / dehydration. P:   Hold HCTZ Trend BMP IVF NS 100 mL/h K 40 x 3 prior to arrival  GASTROINTESTINAL  Lab 09/27/11 2146  AST 23  ALT 15  ALKPHOS 99  BILITOT 0.4  PROT 5.6*  ALBUMIN 3.3*   Abd CT:  8/6 >>> Small esophageal hiatal hernia. Minimal bilateral pleural effusions with basilar atelectasis or infiltration. Right adrenal gland nodule. Prominence of the right ovary. Consider ultrasound for follow-up. Diffusely stool filled colon without bowel  obstruction. Duodenal lipoma.  A:  Abdominal pain, nausea, vomiting. Gastroenteritis? P:   NPO Serial exams Amilase, Lypase Zofran PRN  HEMATOLOGIC  Lab 09/27/11 2146  HGB 12.9  HCT 37.7  PLT 179  INR 2.11*  APTT --   A:  Coumadin induced coagulopathy (atrial fibrillation). P:  Hold Coumadin for now for possible interventions  INFECTIOUS  Lab 09/27/11 2147 09/27/11 2146  WBC -- 14.9*    PROCALCITON <0.10 --   Cultures: 8/6  Blood >>>  Antibiotics: 8/6  Zosyn >>>  A:  No clear source of infection.  Low PCT reassuring. P:   ABx/Cx as above  ENDOCRINE No results found for this basename: GLUCAP:5 in the last 168 hours A:  Hypothyroidism. P:   Hold Synthroid  NEUROLOGIC  A:  Acute encephalopathy (septic, pain Rx).  Unknown baseline.  Dementia? Abdominal pain. P:   Monitor Hold Nemenda  Fentanyl PRN  BEST PRACTICE / DISPOSITION Level of Care:  ICU Primary Service:  PCCM Consultants:  None Code Status:  Full Diet:  NPO DVT Px:  Not indicated (therapeutic INR) + SCD GI Px:  Protonix Skin Integrity:  Intact Social / Family:  Not available  The patient is critically ill with multiple organ systems failure and requires high complexity decision making for assessment and support, frequent evaluation and titration of therapies, application of advanced monitoring technologies  and extensive interpretation of multiple databases. Critical Care Time devoted to patient care services described in this note is 30 minutes.  Lonia Farber, M.D. Pulmonary and Critical Care Medicine [facility] Pager: [phone]  09/28/2011, 1:42 AM

## 2011-09-28 NOTE — Progress Notes (Signed)
CBGs on 09/28/11  184-214 mg/dl.  CBGs greater than 180 mg/dl.  Check HgbA1C for average home blood glucose. Check CBGs every 4 hours while NPO and AC & HS when eating.   Smith Mince RN BSN CDE

## 2011-09-28 NOTE — Progress Notes (Signed)
Pt only received 4 kcl from 2100 when enquired from [place], [place].  Pt received only one unit on the floor after arrival.  Dr. Delford Field made aware instructed that pt need a total of 8 doses.  [place] in Pharmacy notified and order for rest of kcl placed.  Amanda Pea, Charity fundraiser.

## 2011-09-28 NOTE — ED Notes (Signed)
[facility] here to transport pt.   

## 2011-09-28 NOTE — H&P (Signed)
Name: TAWAN CORKERN MRN: [id number] DOB: 1925/02/20    LOS: 1  Referring Provider:  [facility] Reason for Referral:  Suspected sepsis  PULMONARY / CRITICAL CARE MEDICINE  Brief patient description:  78 yo with sudden onset emesis, diarrhea, lower abdominal pain and hypotension who initially presented to [facility] ED and was transferred to [facility] for further management.  Events Since Admission: 8/6 Presented to [facility] with sudden onset emesis, diarrhea, lower abdominal pain, transferred to [facility]  Current Status: BP better.  Still has abd pain, but localized to LLQ.  Vital Signs: Temp:  [97.7 F (36.5 C)-100 F (37.8 C)] 100 F (37.8 C) (08/07 0840) Pulse Rate:  [33-88] 88  (08/07 0800) Resp:  [16-28] 21  (08/07 0800) BP: (91-129)/(37-89) 101/82 mmHg (08/07 0800) SpO2:  [93 %-100 %] 99 % (08/07 0800) Weight:  [160 lb 4.4 oz (72.7 kg)] 160 lb 4.4 oz (72.7 kg) (08/07 0500)  Physical Examination: General:  No distress Neuro:  A&O x 3, normal strength HEENT:  PERRL, dry membranes Neck:  Supple, no JVD Cardiovascular:  Irregular, controlled rate Lungs:  CTAB Abdomen:  Tender LLQ Musculoskeletal:  No edema, moves all extremities Skin:  Intact  Ct Abdomen Pelvis Wo Contrast  09/27/2011  *RADIOLOGY REPORT*  Clinical Data: Severe abdominal pain.  Low blood pressure.  CT ABDOMEN AND PELVIS WITHOUT CONTRAST  Technique:  Multidetector CT imaging of the abdomen and pelvis was performed following the standard protocol without intravenous contrast.  Comparison: None.  Findings: Minimal bilateral pleural effusions with atelectasis or infiltration in the lung bases.  Cardiac enlargement.  Coronary artery calcification.  Small esophageal hiatal hernia.  The unenhanced appearance of the liver, spleen, gallbladder, left adrenal gland, kidneys, and retroperitoneal lymph nodes is unremarkable.  There is a hypodense nodule in the right adrenal gland measuring 1.8 cm diameter consistent with an adenoma. Calcification of  the abdominal aorta without aneurysm.  No retroperitoneal hematomas.  Diffusely stool filled colon without significant distension or wall thickening. 6 mm fat density lesion in the second portion of the duodenum consistent with lipoma.  The stomach and small bowel are not abnormally distended.  No free air or free fluid in the abdomen.  Prominent visceral adipose tissues.  Pelvis:  A the uterus appears to be surgically absent.  There is a right-sided pelvic mass measuring 3.2 x 4.2 cm, likely representing the right ovary.  This is a moderately enlarged for age and ultrasound should be considered for followup.  The bladder is decompressed with Foley catheter.  No free air or free fluid in the abdomen.  No significant pelvic lymphadenopathy.  The appendix is not visualized.  Degenerative changes in the lumbar spine.  IMPRESSION:  Small esophageal hiatal hernia.  Minimal bilateral pleural effusions with basilar atelectasis or infiltration.  Right adrenal gland nodule.  Prominence of the right ovary.  Consider ultrasound for follow-up.  Diffusely stool filled colon without bowel obstruction.  Duodenal lipoma.  Original Report Authenticated By: Marlon Pel, M.D.   Dg Chest Portable 1 View  09/28/2011  *RADIOLOGY REPORT*  Clinical Data: Right central line placement.  PORTABLE CHEST - 1 VIEW  Comparison: None.  Findings: The right central venous catheter placed with tip over the low right atrium.  If positioning as above the SVC / RA junction is desired, the catheter could be withdrawn about 7.7 cm. No pneumothorax.  Shallow inspiration.  Mild cardiac enlargement with normal pulmonary vascularity.  Interstitial changes suggesting fibrosis. Calcified and tortuous aorta.  No blunting of costophrenic angles. Mediastinal contours appear intact.  IMPRESSION: Right central venous catheter placed with tip over the low right atrium, about 7.7 cm below the SVC / RA junction.  No pneumothorax.  Original Report Authenticated  By: Marlon Pel, M.D.   Dg Abd Acute W/chest  09/27/2011  *RADIOLOGY REPORT*  Clinical Data: Abdominal pain, shortness of breath  ACUTE ABDOMEN SERIES (ABDOMEN 2 VIEW & CHEST 1 VIEW)  Comparison: None.  Findings: Mild cardiomegaly with vascular congestion and chronic interstitial changes.  Negative for CHF or pneumonia.  No effusion or pneumothorax.  no definite free air.  Nonobstructive bowel gas pattern.  There is stool distally in the sigmoid region.  Mild nonspecific gaseous distention in the right abdomen.  Limited exam because of positioning.  Degenerative changes of the spine and hips.  Pelvic calcifications consistent with venous phleboliths. Calcification over the right iliac crest noted, nonspecific this could be an injection granuloma versus an appendicolith.  IMPRESSION: Cardiomegaly without CHF or pneumonia  Negative for obstruction or free air.  Original Report Authenticated By: Judie Petit. Ruel Favors, M.D.    ASSESSMENT AND PLAN  PULMONARY  Lab 09/27/11 2228  PHART 7.319*  PCO2ART 32.3*  PO2ART 231.0*  HCO3 16.1*  O2SAT 99.3    A:  No active issues. P:   Supplemental oxygen PRN Goal SpO2>92  CARDIOVASCULAR  Lab 09/28/11 0540 09/27/11 2146  TROPONINI -- <0.30  LATICACIDVEN 4.8* 5.7*  PROBNP -- --   ECG:  8/6 >>> Atrial fibrillation, controlled rate, nonspecific ST-T changes Lines: R IJ CVL 8/6 >>>  A: Hypotension on the background of volume depletion (emesis, diarrhea, diuretics, antihypertensives)>>resolved P:  Hold Lisinopril / Verapamil Continue IV fluids   RENAL  Lab 09/28/11 0415 09/27/11 2146  NA 141 138  K 2.8* 2.6*  CL 106 104  CO2 20 17*  BUN 19 18  CREATININE 0.83 0.92  CALCIUM 9.1 10.0  MG -- --  PHOS -- --   Intake/Output      08/06 0701 - 08/07 0700 08/07 0701 - 08/08 0700   I.V. (mL/kg) 600 (8.3) 100 (1.4)   IV Piggyback 62 50   Total Intake(mL/kg) 662 (9.1) 150 (2.1)   Urine (mL/kg/hr) 500 (0.3) 150   Total Output 500 150   Net  +162 0         Foley:  8/6 >>>  A:  Hypokalemia.  Metabolic acidosis>>resolved. P:   Hold HCTZ Trend BMP IVF NS 100 mL/h Replace  GASTROINTESTINAL  Lab 09/27/11 2146  AST 23  ALT 15  ALKPHOS 99  BILITOT 0.4  PROT 5.6*  ALBUMIN 3.3*   Abd CT:  8/6 >>> Small esophageal hiatal hernia. Minimal bilateral pleural effusions with basilar atelectasis or infiltration. Right adrenal gland nodule. Prominence of the right ovary. Consider ultrasound for follow-up. Diffusely stool filled colon without bowel obstruction. Duodenal lipoma.  A:  Abdominal pain, nausea, vomiting.  Appreciate help from Surgery. P:   F/u Repeat CT abd/pelvis  HEMATOLOGIC  Lab 09/27/11 2146  HGB 12.9  HCT 37.7  PLT 179  INR 2.11*  APTT --   A:  Coumadin induced coagulopathy (atrial fibrillation). P:  Hold Coumadin for now for possible interventions  INFECTIOUS  Lab 09/27/11 2147 09/27/11 2146  WBC -- 14.9*  PROCALCITON <0.10 --   Cultures: 8/6  Blood >>>  Antibiotics: 8/6  Zosyn >>>  A:  Possible abd source of infection. P:   ABx/Cx as above  ENDOCRINE  Lab 09/28/11  0137  GLUCAP 184*   A:  Hypothyroidism. P:   Hold Synthroid  NEUROLOGIC  A:  Acute encephalopathy (septic, pain Rx)>>resolved. P:   Monitor Hold Nemenda  Fentanyl PRN  BEST PRACTICE / DISPOSITION Level of Care:  Transfer to telemetry Primary Service: Will ask [facility] to assume care from 8/08 and PCCM sign off Consultants:  CCS Code Status:  Full Diet:  NPO DVT Px:  Not indicated (therapeutic INR) + SCD GI Px:  Protonix Skin Integrity:  Intact Social / Family:  Updated family at bedside  Coralyn Helling, MD [facility] Pulmonary/Critical Care 09/28/2011, 10:14 AM Pager:  [phone] After 3pm call: [phone]

## 2011-09-28 NOTE — Progress Notes (Signed)
78yo female transferred from [facility] for suspected sepsis/SIRS with no clear source of infection given sudden-onset emesis, diarrhea, lower abdominal pain, and hypotension, to begin IV ABX.  Will begin Zosyn 3.375g IV Q8H and monitor CBC, Cx, further management plans.  Vernard Gambles, PharmD, BCPS 09/28/2011 2:52 AM

## 2011-09-28 NOTE — Progress Notes (Signed)
Patient reexamined.  Reports less abdominal pain and no nausea. Of concern is continuing abdominal tenderness on exam and some abdominal rigidity that was not present upon initial examination.  Discussed with Dr. Ezzard Standing (Surgery) who will come and examine patient shortly.  Orlean Bradford, M.D., F.C.C.P. Pulmonary and Critical Care Medicine [facility] Cell: [phone] Pager: [phone]

## 2011-09-28 NOTE — Progress Notes (Signed)
Unable to start FFP as pt is currently off the floor for CT scan as planned prior to transfer to [id number].  Jettie Pagan NP made aware that FFP not started yet as a result and to start when pt is back on the floor.  Amanda Pea, Charity fundraiser.

## 2011-09-28 NOTE — Progress Notes (Signed)
[facility] ADULT ICU REPLACEMENT PROTOCOL FOR AM LAB REPLACEMENT ONLY  The patient does apply for the [facility] Adult ICU Electrolyte Replacment Protocol based on the criteria listed below:   1. Is GFR >/= 50 ml/min? yes  Patient's GFR today is 73 2. Is urine output >/= 0.5 ml/kg/hr for the last 8 hours? yes Patient's UOP is 1.18 ml/kg/hr 3. Is BUN < 30 mg/dL? yes  Patient's BUN today is 19 4. Abnormal electrolyte(s): K(2.8) 5. Ordered repletion with:67meq  6. If a panic level lab has been reported, has the CCM MD in charge been notified? yes.   Physician:  Rosaura Carpenter 09/28/2011 6:27 AM

## 2011-09-28 NOTE — Progress Notes (Signed)
eLink Physician-Brief Progress Note Patient Name: Tamara [name] DOB: 1924-10-21 MRN: [id number]  Date of Service  09/28/2011   HPI/Events of Note  RN calling elink   - HR 150 again and bp fine and RN thinks this might be due to FFP  - EKG - 130 and sinus tach   eICU Interventions  Transfer to ICU on account of tachycardia, multiple calls needing closer observation     Intervention Category Major Interventions: Other:  Hong Timm 09/28/2011, 11:47 PM

## 2011-09-28 NOTE — Progress Notes (Signed)
Pt HR 150 prior to start of FFP and 145 after initial tx.  Dr Delford Field be aware and instructed to call ordering MD.  Dr Lindie Spruce informed of pulse and also temp running 100.1 at 1800 and 100.7 at 1845.  Dr. Lindie Spruce instructed it was ok to continue to give FFP.  Dr Delford Field made aware of Dr. Lindie Spruce instruction to continue with FFP.  Nyajah Hyson,RN.

## 2011-09-28 NOTE — Progress Notes (Signed)
Verify with Jettie Pagan that FFP orders to be given on floor as was questioned by [facility] by Aggie Cosier.  Informed Lafonda Mosses in  [facility] that FFP required.  Amanda Pea, Charity fundraiser.

## 2011-09-28 NOTE — ED Notes (Signed)
Fentanyl 50mcg given

## 2011-09-28 NOTE — Consult Note (Signed)
JEANNEMARIE SAWAYA [birth year]  [id number].   PCP: Dr. Dwana Melena Requesting MD: Dr. Lonia Farber Chief Complaint/Reason for Consult: abdominal pain HPI: This is an 78 yo WF who presented to [facility] last night with generalized malaise, chills, diaphoresis, and some abdominal pain.  She states around 19:30 last night that this all started with excessive N/V.  She had 3 BMs during this time as well, however, the patient denies diarrhea.  Of note, the patient does admit to chronic laxative use on a daily basis.  (Colonoscopy about 10 yrs ago.)  She had a workup done at [facility], including a CT scan that revealed a colon full of stool and a right pelvic mass that is around 3x4 cm that appears to be her right ovary.  She was mildly hypotensive at [facility] with an elevated lactic acid.    She was then transferred to [facility] for further management.  Apparently within, the last 4 hrs her pain in her abdominal has localized and become more severe in her LLQ.  She continues to have a lactic acidosis with a lactate level of 4.8.  She remains nauseated.  She has not had a repeat CBC or films this morning.  Due to worsening pain, we were asked to see the patient for further evaluation.  According to daughter in law, the patient fell at home about one week ago, but I do not see how that is related to this acute event.  Review of Systems:   Please see HPI, otherwise all other systems have been reviewed and are negative.  History reviewed. No pertinent family history.  Past Medical History  Diagnosis Date  . Hypertension   . Atrial fibrillation   . Osteoporosis   . Arthritis     Past Surgical History  Procedure Date  . Abdominal hysterectomy   . Appendectomy     Social History:  reports that she has never smoked. She does not have any smokeless tobacco history on file. She reports that she does not drink alcohol. Her drug history not on file.  Allergies: No Known Allergies  Medications Prior to Admission    Medication Sig Dispense Refill  . acetaminophen (ARTHRITIS PAIN RELIEF) 650 MG CR tablet Take 650 mg by mouth daily as needed. For arthritis      . alendronate (FOSAMAX) 70 MG tablet Take 70 mg by mouth every 7 (seven) days. Take with a full glass of water on an empty stomach for the bones      . Calcium Carbonate-Vitamin D (CALCIUM 600 + D PO) Take 1 tablet by mouth daily. For bone health      . cetirizine (ZYRTEC) 10 MG tablet Take 10 mg by mouth daily. For allergies      . hydrochlorothiazide (HYDRODIURIL) 25 MG tablet Take 25 mg by mouth daily. For blood pressure      . levothyroxine (SYNTHROID, LEVOTHROID) 88 MCG tablet Take 88 mcg by mouth daily. For thyroid      . lisinopril (PRINIVIL,ZESTRIL) 10 MG tablet Take 10 mg by mouth daily. For blood pressure      . memantine (NAMENDA) 10 MG tablet Take 20 mg by mouth daily. For memory      . omeprazole (PRILOSEC) 40 MG capsule Take 40 mg by mouth daily. For heartburn      . polyethylene glycol powder (GLYCOLAX/MIRALAX) powder Take 17 g by mouth daily. For constipation      . potassium chloride SA (K-DUR,KLOR-CON) 20 MEQ tablet Take 40 mEq by  mouth daily.      . simvastatin (ZOCOR) 40 MG tablet Take 40 mg by mouth at bedtime. For cholesterol      . verapamil (CALAN-SR) 240 MG CR tablet Take 240 mg by mouth 2 (two) times daily. For blood pressure      . warfarin (COUMADIN) 5 MG tablet Take 5 mg by mouth as directed. For A.FIB        Blood pressure 129/66, pulse 88, temperature 97.7 F (36.5 C), temperature source Oral, resp. rate 25, height 5\' 10"  (1.778 m), weight 160 lb 4.4 oz (72.7 kg), SpO2 100.00%. Physical Exam: General: pleasant, WD, WN white female who is laying in bed in minimal distress HEENT: head is normocephalic, atraumatic.  Sclera are noninjected.  PERRL.  Ears and nose without any masses or lesions.  Mouth is pink and moist Heart: regular, rate, and rhythm.  Normal s1,s2. No obvious murmurs, gallops, or rubs noted.  Palpable  radial and pedal pulses bilaterally, currently in NSR. Lungs: CTAB, no wheezes, rhonchi, or rales noted.  Respiratory effort nonlabored Abd: soft, moderately tender in the LLQ and suprapubic region.  It does feels more full in this region.  Hypoactive BS with mild distention, no masses, hernias, or organomegaly noted. MS: all 4 extremities are symmetrical with no cyanosis, clubbing, or edema. Skin: warm and dry with no masses, lesions, or rashes Psych: A&Ox3 with an appropriate affect.    Results for orders placed during the hospital encounter of 09/27/11 (from the past 48 hour(s))  CBC WITH DIFFERENTIAL     Status: Abnormal   Collection Time   09/27/11  9:46 PM      Component Value Range Comment   WBC 14.9 (*) 4.0 - 10.5 K/uL    RBC 4.10  3.87 - 5.11 MIL/uL    Hemoglobin 12.9  12.0 - 15.0 g/dL    HCT 16.1  09.6 - 04.5 %    MCV 92.0  78.0 - 100.0 fL    MCH 31.5  26.0 - 34.0 pg    MCHC 34.2  30.0 - 36.0 g/dL    RDW 40.9  81.1 - 91.4 %    Platelets 179  150 - 400 K/uL CORRECTED ON 08/06 AT 2225: PREVIOUSLY REPORTED AS HIDE   Neutrophils Relative 84 (*) 43 - 77 %    Lymphocytes Relative 13  12 - 46 %    Monocytes Relative 2 (*) 3 - 12 %    Eosinophils Relative 1  0 - 5 %    Basophils Relative 0  0 - 1 %    Neutro Abs 12.6 (*) 1.7 - 7.7 K/uL    Lymphs Abs 1.9  0.7 - 4.0 K/uL    Monocytes Absolute 0.3  0.1 - 1.0 K/uL    Eosinophils Absolute 0.1  0.0 - 0.7 K/uL    Basophils Absolute 0.0  0.0 - 0.1 K/uL    RBC Morphology SPHEROCYTES      WBC Morphology TOXIC GRANULATION      Smear Review LARGE PLATELETS PRESENT   PLATELET COUNT CONFIRMED BY SMEAR  COMPREHENSIVE METABOLIC PANEL     Status: Abnormal   Collection Time   09/27/11  9:46 PM      Component Value Range Comment   Sodium 138  135 - 145 mEq/L    Potassium 2.6 (*) 3.5 - 5.1 mEq/L    Chloride 104  96 - 112 mEq/L    CO2 17 (*) 19 - 32 mEq/L    Glucose,  Bld 232 (*) 70 - 99 mg/dL    BUN 18  6 - 23 mg/dL    Creatinine, Ser 1.19   0.50 - 1.10 mg/dL    Calcium 14.7  8.4 - 10.5 mg/dL    Total Protein 5.6 (*) 6.0 - 8.3 g/dL    Albumin 3.3 (*) 3.5 - 5.2 g/dL    AST 23  0 - 37 U/L    ALT 15  0 - 35 U/L    Alkaline Phosphatase 99  39 - 117 U/L    Total Bilirubin 0.4  0.3 - 1.2 mg/dL    GFR calc non Af Amer 56 (*) >90 mL/min    GFR calc Af Amer 64 (*) >90 mL/min   LIPASE, BLOOD     Status: Normal   Collection Time   09/27/11  9:46 PM      Component Value Range Comment   Lipase 45  11 - 59 U/L   LACTIC ACID, PLASMA     Status: Abnormal   Collection Time   09/27/11  9:46 PM      Component Value Range Comment   Lactic Acid, Venous 5.7 (*) 0.5 - 2.2 mmol/L   TROPONIN I     Status: Normal   Collection Time   09/27/11  9:46 PM      Component Value Range Comment   Troponin I <0.30  <0.30 ng/mL   PROTIME-INR     Status: Abnormal   Collection Time   09/27/11  9:46 PM      Component Value Range Comment   Prothrombin Time 24.0 (*) 11.6 - 15.2 seconds    INR 2.11 (*) 0.00 - 1.49   PROCALCITONIN     Status: Normal   Collection Time   09/27/11  9:47 PM      Component Value Range Comment   Procalcitonin <0.10     TYPE AND SCREEN     Status: Normal   Collection Time   09/27/11  9:49 PM      Component Value Range Comment   ABO/RH(D) B POS      Antibody Screen NEG      Sample Expiration 09/30/2011     BLOOD GAS, ARTERIAL     Status: Abnormal   Collection Time   09/27/11 10:28 PM      Component Value Range Comment   O2 Content 100.0      Delivery systems OXYGEN MASK      pH, Arterial 7.319 (*) 7.350 - 7.450    pCO2 arterial 32.3 (*) 35.0 - 45.0 mmHg    pO2, Arterial 231.0 (*) 80.0 - 100.0 mmHg    Bicarbonate 16.1 (*) 20.0 - 24.0 mEq/L    TCO2 14.7  0 - 100 mmol/L    Acid-base deficit 8.8 (*) 0.0 - 2.0 mmol/L    O2 Saturation 99.3      Patient temperature 37.0      Collection site RIGHT RADIAL      Drawn by [id number]      Sample type ARTERIAL      Allens test (pass/fail) PASS  PASS   KETONES, QUALITATIVE     Status: Normal    Collection Time   09/27/11 10:36 PM      Component Value Range Comment   Acetone, Bld NEGATIVE  NEGATIVE   URINALYSIS, ROUTINE W REFLEX MICROSCOPIC     Status: Abnormal   Collection Time   09/27/11 10:44 PM      Component Value Range Comment  Color, Urine YELLOW  YELLOW    APPearance CLEAR  CLEAR    Specific Gravity, Urine 1.020  1.005 - 1.030    pH 7.0  5.0 - 8.0    Glucose, UA 100 (*) NEGATIVE mg/dL    Hgb urine dipstick NEGATIVE  NEGATIVE    Bilirubin Urine NEGATIVE  NEGATIVE    Ketones, ur NEGATIVE  NEGATIVE mg/dL    Protein, ur 782 (*) NEGATIVE mg/dL    Urobilinogen, UA 0.2  0.0 - 1.0 mg/dL    Nitrite NEGATIVE  NEGATIVE    Leukocytes, UA NEGATIVE  NEGATIVE   URINE MICROSCOPIC-ADD ON     Status: Abnormal   Collection Time   09/27/11 10:44 PM      Component Value Range Comment   Squamous Epithelial / LPF FEW (*) RARE    WBC, UA 3-6  <3 WBC/hpf    Bacteria, UA FEW (*) RARE    Casts HYALINE CASTS (*) NEGATIVE   MRSA PCR SCREENING     Status: Normal   Collection Time   09/28/11  1:31 AM      Component Value Range Comment   MRSA by PCR NEGATIVE  NEGATIVE   GLUCOSE, CAPILLARY     Status: Abnormal   Collection Time   09/28/11  1:37 AM      Component Value Range Comment   Glucose-Capillary 184 (*) 70 - 99 mg/dL    Comment 1 Documented in Chart      Comment 2 Notify RN     BASIC METABOLIC PANEL     Status: Abnormal   Collection Time   09/28/11  4:15 AM      Component Value Range Comment   Sodium 141  135 - 145 mEq/L    Potassium 2.8 (*) 3.5 - 5.1 mEq/L    Chloride 106  96 - 112 mEq/L    CO2 20  19 - 32 mEq/L    Glucose, Bld 214 (*) 70 - 99 mg/dL    BUN 19  6 - 23 mg/dL    Creatinine, Ser 9.56  0.50 - 1.10 mg/dL    Calcium 9.1  8.4 - 21.3 mg/dL    GFR calc non Af Amer 63 (*) >90 mL/min    GFR calc Af Amer 73 (*) >90 mL/min   AMYLASE     Status: Normal   Collection Time   09/28/11  4:15 AM      Component Value Range Comment   Amylase 73  0 - 105 U/L   LACTIC ACID, PLASMA      Status: Abnormal   Collection Time   09/28/11  5:40 AM      Component Value Range Comment   Lactic Acid, Venous 4.8 (*) 0.5 - 2.2 mmol/L    Ct Abdomen Pelvis Wo Contrast  09/27/2011  *RADIOLOGY REPORT*  Clinical Data: Severe abdominal pain.  Low blood pressure.  CT ABDOMEN AND PELVIS WITHOUT CONTRAST  Technique:  Multidetector CT imaging of the abdomen and pelvis was performed following the standard protocol without intravenous contrast.  Comparison: None.  Findings: Minimal bilateral pleural effusions with atelectasis or infiltration in the lung bases.  Cardiac enlargement.  Coronary artery calcification.  Small esophageal hiatal hernia.  The unenhanced appearance of the liver, spleen, gallbladder, left adrenal gland, kidneys, and retroperitoneal lymph nodes is unremarkable.  There is a hypodense nodule in the right adrenal gland measuring 1.8 cm diameter consistent with an adenoma. Calcification of the abdominal aorta without aneurysm.  No retroperitoneal  hematomas.  Diffusely stool filled colon without significant distension or wall thickening. 6 mm fat density lesion in the second portion of the duodenum consistent with lipoma.  The stomach and small bowel are not abnormally distended.  No free air or free fluid in the abdomen.  Prominent visceral adipose tissues.  Pelvis:  A the uterus appears to be surgically absent.  There is a right-sided pelvic mass measuring 3.2 x 4.2 cm, likely representing the right ovary.  This is a moderately enlarged for age and ultrasound should be considered for followup.  The bladder is decompressed with Foley catheter.  No free air or free fluid in the abdomen.  No significant pelvic lymphadenopathy.  The appendix is not visualized.  Degenerative changes in the lumbar spine.  IMPRESSION:  Small esophageal hiatal hernia.  Minimal bilateral pleural effusions with basilar atelectasis or infiltration.  Right adrenal gland nodule.  Prominence of the right ovary.  Consider  ultrasound for follow-up.  Diffusely stool filled colon without bowel obstruction.  Duodenal lipoma.  Original Report Authenticated By: Marlon Pel, M.D.   Dg Chest Portable 1 View  09/28/2011  *RADIOLOGY REPORT*  Clinical Data: Right central line placement.  PORTABLE CHEST - 1 VIEW  Comparison: None.  Findings: The right central venous catheter placed with tip over the low right atrium.  If positioning as above the SVC / RA junction is desired, the catheter could be withdrawn about 7.7 cm. No pneumothorax.  Shallow inspiration.  Mild cardiac enlargement with normal pulmonary vascularity.  Interstitial changes suggesting fibrosis. Calcified and tortuous aorta.  No blunting of costophrenic angles. Mediastinal contours appear intact.  IMPRESSION: Right central venous catheter placed with tip over the low right atrium, about 7.7 cm below the SVC / RA junction.  No pneumothorax.  Original Report Authenticated By: Marlon Pel, M.D.   Dg Abd Acute W/chest  09/27/2011  *RADIOLOGY REPORT*  Clinical Data: Abdominal pain, shortness of breath  ACUTE ABDOMEN SERIES (ABDOMEN 2 VIEW & CHEST 1 VIEW)  Comparison: None.  Findings: Mild cardiomegaly with vascular congestion and chronic interstitial changes.  Negative for CHF or pneumonia.  No effusion or pneumothorax.  no definite free air.  Nonobstructive bowel gas pattern.  There is stool distally in the sigmoid region.  Mild nonspecific gaseous distention in the right abdomen.  Limited exam because of positioning.  Degenerative changes of the spine and hips.  Pelvic calcifications consistent with venous phleboliths. Calcification over the right iliac crest noted, nonspecific this could be an injection granuloma versus an appendicolith.  IMPRESSION: Cardiomegaly without CHF or pneumonia  Negative for obstruction or free air.  Original Report Authenticated By: Judie Petit. Ruel Favors, M.D.   Assessment/Plan 1. Abdominal pain, question diverticulitis vs ischemic  bowel 2. SIRS  Lactic Acid - 4.8 - 09/28/2011 3. A. Fib 4. Constipation  The patient takes chronic laxatives and CT scan findings a colon full of stool are consistent with this. 5.  Anticoagulated  PT/INR - 24/2.11 - 09/27/2011  Will reverse in anticipation of possible surgery. 6.  Hypokalemia - being corrected  Plan: 1. Dr. Ezzard Standing has evaluated the patient with me.  She has very localized pain in her LLQ concerning for diverticulitis versus an ischemic segment of colon.  We will repeat her CT scan at this point with oral contrast to see if this gives [place] any further information.  We will go ahead and ask that her INR be reversed for possible OR.    Dr. Ezzard Standing has spoken  with the patient's daughter in law, Dacota Devall, regarding this situation.  She and the patient understand what is going on.    We will follow closely with you.  Thank you for this consultation.  OSBORNE,KELLY E 09/28/2011, 7:41 AM  I spoke to daughter in law, Kitana Gage, about Ms. Males. Patient clearly tender in LLQ with guarding.  I thing there would be a benefit to repeating the abdominal CT scan with oral contrast.  (They did not give oral contrast at [facility] last PM)  We will even give oral contrast and delay the CT scan in hopes of contrast reaching the colon. Will follow.  Ovidio Kin, MD, [facility] Surgery Pager: [phone] Office phone:  [phone]

## 2011-09-29 LAB — BASIC METABOLIC PANEL
CO2: 27 mEq/L (ref 19–32)
Chloride: 105 mEq/L (ref 96–112)
Glucose, Bld: 86 mg/dL (ref 70–99)
Potassium: 3.1 mEq/L — ABNORMAL LOW (ref 3.5–5.1)
Sodium: 140 mEq/L (ref 135–145)

## 2011-09-29 LAB — GLUCOSE, CAPILLARY

## 2011-09-29 LAB — CBC
Hemoglobin: 11.9 g/dL — ABNORMAL LOW (ref 12.0–15.0)
MCH: 31.2 pg (ref 26.0–34.0)
MCV: 91.9 fL (ref 78.0–100.0)
RBC: 3.82 MIL/uL — ABNORMAL LOW (ref 3.87–5.11)

## 2011-09-29 MED ORDER — DEXTROSE 5 % IV SOLN
5.0000 mg/h | INTRAVENOUS | Status: DC
  Administered 2011-09-29: 5 mg/h via INTRAVENOUS
  Filled 2011-09-29: qty 100

## 2011-09-29 MED ORDER — METOPROLOL TARTRATE 1 MG/ML IV SOLN
2.5000 mg | Freq: Four times a day (QID) | INTRAVENOUS | Status: DC
  Administered 2011-09-29 – 2011-09-30 (×4): 2.5 mg via INTRAVENOUS
  Filled 2011-09-29 (×8): qty 5

## 2011-09-29 MED ORDER — LEVOTHYROXINE SODIUM 100 MCG IV SOLR
44.0000 ug | Freq: Every day | INTRAVENOUS | Status: DC
  Administered 2011-09-29 – 2011-09-30 (×2): 44 ug via INTRAVENOUS
  Filled 2011-09-29 (×2): qty 2.2

## 2011-09-29 MED ORDER — POTASSIUM CHLORIDE 10 MEQ/50ML IV SOLN
10.0000 meq | INTRAVENOUS | Status: AC
  Administered 2011-09-29 (×4): 10 meq via INTRAVENOUS
  Filled 2011-09-29 (×4): qty 50

## 2011-09-29 MED ORDER — ENOXAPARIN SODIUM 80 MG/0.8ML ~~LOC~~ SOLN
1.0000 mg/kg | Freq: Two times a day (BID) | SUBCUTANEOUS | Status: DC
  Administered 2011-09-29 – 2011-10-02 (×6): 70 mg via SUBCUTANEOUS
  Filled 2011-09-29 (×8): qty 0.8

## 2011-09-29 NOTE — Progress Notes (Signed)
Pt has been off of cardizem gtt since 0800 due to hr within parameters.  Lopressor was started at 1200 (2.5mg  q6h) but doesn't hold her for 6 hours.  Her HR was 120's consistently several hours after the first lopressor dose .  Now, 1 hour after 2nd dose, she is intermittently >115.  Elink Rn was notified. After speaking with MD, he just wanted to monitor patient for now.

## 2011-09-29 NOTE — Progress Notes (Signed)
eLink Physician-Brief Progress Note Patient Name: Tamara [name] DOB: [birth year] MRN: [id number]  Date of Service  09/29/2011   HPI/Events of Note   12 lead ekg and monitor suggests A Fib; upon tx to ICU  eICU Interventions  Start cardizem gtt      Berma Harts 09/29/2011, 3:01 AM

## 2011-09-29 NOTE — Progress Notes (Signed)
General Surgery Note  LOS: 2 days  Room - 2106  Assessment/Plan: 1. LLQ abdominal pain, thickened left colon and sigmoid colon on CT scan c/w colitis.  Medical treatment for now - hydration, antibiotics - and no role for surgery.   Patient is clinically better today.  WBC 12,300 - 09/29/2011. On Zosyn.  Okay to start clear liquids, but I would be slow to advance diet.  2. SIRS   Lactic Acid - 4.8 - 09/28/2011   3. A. Fib   Rapid ventricular rate.  She went briefly to 4700 last PM, but was readmitted to 2100 because of a rapid ventricular rate.  On diltiazem. 4. Constipation   The patient takes chronic laxatives and CT scan findings a colon full of stool are consistent with this.  5. Anticoagulated   PT/INR - 24/2.11 - 09/27/2011   Was given FFP yesterday. 6. Hypokalemia -   3.1 - 09/29/2011 7.  Left lower lobe atelectasis vs pneumonia by CT scan - 09/28/2011 8.  Physical activity - should get out of bed.  Subjective:  Feels better today.  No flatus or BM. Objective:   Filed Vitals:   09/29/11 0700  BP: 105/52  Pulse: 58  Temp:   Resp: 17     Intake/Output from previous day:  08/07 0701 - 08/08 0700 In: 1894.3 [I.V.:903.3; Blood:316; IV Piggyback:675] Out: 1820 [Urine:1820]  Intake/Output this shift:      Physical Exam:   General: WN older WF who is alert and oriented.   She looks better than yesterday   HEENT: Normal. Pupils equal. .   Lungs: Clear with reasonable inspiration.   Abdomen: Tender LLQ, but not as severe as yesterday.   Psychiatric: Has normal mood and affect.    Lab Results:    Basename 09/29/11 0500 09/28/11 0800  WBC 12.3* 15.2*  HGB 11.9* 13.4  HCT 35.1* 38.9  PLT 148* 176    BMET   Basename 09/29/11 0500 09/28/11 2153  NA 140 138  K 3.1* 3.5  CL 105 104  CO2 27 24  GLUCOSE 86 99  BUN 19 19  CREATININE 0.70 0.76  CALCIUM 8.2* 8.5    PT/INR   Basename 09/27/11 2146  LABPROT 24.0*  INR 2.11*    ABG   Basename 09/27/11 2228  PHART  7.319*  HCO3 16.1*     Studies/Results:  Ct Abdomen Pelvis Wo Contrast  09/28/2011  *RADIOLOGY REPORT*  Clinical Data: Increasing abdominal pain in the left lower quadrant.  Nausea and vomiting.  Previous hysterectomy and appendectomy.  CT ABDOMEN AND PELVIS WITHOUT CONTRAST  Technique:  Multidetector CT imaging of the abdomen and pelvis was performed following the standard protocol without intravenous contrast.  Comparison: CT scan and chest and abdomen radiographs dated 09/27/2011  Findings: The patient has a new consolidative pneumonia in the left lower lobe with a tiny effusion.  There is a new tiny right effusion with focal atelectasis in the right lower lobe. Moderate hiatal hernia.  The patient has developed acute colitis of the descending colon extending from the splenic flexure into the proximal sigmoid region.  This is new since the prior study.  There is diffuse pericolonic inflammation without an abscess.  There is a small amount of new ascites in the pelvis and in the left pericolic gutter.  The liver, spleen, pancreas, left adrenal gland, and kidneys are normal.  There is a 14 mm low density lesion in the right adrenal gland consistent with benign adenoma.  Lipoma in the duodenum is again noted.  Cyst is noted on the right ovary, slightly smaller than on the prior study.  Left ovary is normal.  Uterus has been removed. Foley catheter in place.  No visible diverticula in the colon.  IMPRESSION: New acute colitis involving the descending colon with pericolonic inflammation and a small amount of ascites.  New left lower lobe pneumonia.  New tiny bilateral pleural effusions and right base atelectasis.  Original Report Authenticated By: Gwynn Burly, M.D.   Ct Abdomen Pelvis Wo Contrast  09/27/2011  *RADIOLOGY REPORT*  Clinical Data: Severe abdominal pain.  Low blood pressure.  CT ABDOMEN AND PELVIS WITHOUT CONTRAST  Technique:  Multidetector CT imaging of the abdomen and pelvis was performed  following the standard protocol without intravenous contrast.  Comparison: None.  Findings: Minimal bilateral pleural effusions with atelectasis or infiltration in the lung bases.  Cardiac enlargement.  Coronary artery calcification.  Small esophageal hiatal hernia.  The unenhanced appearance of the liver, spleen, gallbladder, left adrenal gland, kidneys, and retroperitoneal lymph nodes is unremarkable.  There is a hypodense nodule in the right adrenal gland measuring 1.8 cm diameter consistent with an adenoma. Calcification of the abdominal aorta without aneurysm.  No retroperitoneal hematomas.  Diffusely stool filled colon without significant distension or wall thickening. 6 mm fat density lesion in the second portion of the duodenum consistent with lipoma.  The stomach and small bowel are not abnormally distended.  No free air or free fluid in the abdomen.  Prominent visceral adipose tissues.  Pelvis:  A the uterus appears to be surgically absent.  There is a right-sided pelvic mass measuring 3.2 x 4.2 cm, likely representing the right ovary.  This is a moderately enlarged for age and ultrasound should be considered for followup.  The bladder is decompressed with Foley catheter.  No free air or free fluid in the abdomen.  No significant pelvic lymphadenopathy.  The appendix is not visualized.  Degenerative changes in the lumbar spine.  IMPRESSION:  Small esophageal hiatal hernia.  Minimal bilateral pleural effusions with basilar atelectasis or infiltration.  Right adrenal gland nodule.  Prominence of the right ovary.  Consider ultrasound for follow-up.  Diffusely stool filled colon without bowel obstruction.  Duodenal lipoma.  Original Report Authenticated By: Marlon Pel, M.D.   Dg Chest Portable 1 View  09/28/2011  *RADIOLOGY REPORT*  Clinical Data: Right central line placement.  PORTABLE CHEST - 1 VIEW  Comparison: None.  Findings: The right central venous catheter placed with tip over the low right  atrium.  If positioning as above the SVC / RA junction is desired, the catheter could be withdrawn about 7.7 cm. No pneumothorax.  Shallow inspiration.  Mild cardiac enlargement with normal pulmonary vascularity.  Interstitial changes suggesting fibrosis. Calcified and tortuous aorta.  No blunting of costophrenic angles. Mediastinal contours appear intact.  IMPRESSION: Right central venous catheter placed with tip over the low right atrium, about 7.7 cm below the SVC / RA junction.  No pneumothorax.  Original Report Authenticated By: Marlon Pel, M.D.   Dg Abd Acute W/chest  09/27/2011  *RADIOLOGY REPORT*  Clinical Data: Abdominal pain, shortness of breath  ACUTE ABDOMEN SERIES (ABDOMEN 2 VIEW & CHEST 1 VIEW)  Comparison: None.  Findings: Mild cardiomegaly with vascular congestion and chronic interstitial changes.  Negative for CHF or pneumonia.  No effusion or pneumothorax.  no definite free air.  Nonobstructive bowel gas pattern.  There is stool distally in the  sigmoid region.  Mild nonspecific gaseous distention in the right abdomen.  Limited exam because of positioning.  Degenerative changes of the spine and hips.  Pelvic calcifications consistent with venous phleboliths. Calcification over the right iliac crest noted, nonspecific this could be an injection granuloma versus an appendicolith.  IMPRESSION: Cardiomegaly without CHF or pneumonia  Negative for obstruction or free air.  Original Report Authenticated By: Judie Petit. Ruel Favors, M.D.     Anti-infectives:   Anti-infectives     Start     Dose/Rate Route Frequency Ordered Stop   09/28/11 0600  piperacillin-tazobactam (ZOSYN) IVPB 3.375 g       3.375 g 12.5 mL/hr over 240 Minutes Intravenous Every 8 hours 09/28/11 0249     09/27/11 2230  piperacillin-tazobactam (ZOSYN) IVPB 3.375 g       3.375 g 12.5 mL/hr over 240 Minutes Intravenous  Once 09/27/11 2228 09/27/11 2315   09/27/11 2230   vancomycin (VANCOCIN) IVPB 1000 mg/200 mL premix          1,000 mg 200 mL/hr over 60 Minutes Intravenous  Once 09/27/11 2228 09/28/11 0045          Ovidio Kin, MD, FACS Pager: [phone],   [facility] Office: [phone] 09/29/2011

## 2011-09-29 NOTE — Progress Notes (Signed)
[facility] ADULT ICU REPLACEMENT PROTOCOL FOR AM LAB REPLACEMENT ONLY  The patient does apply for the [facility] Adult ICU Electrolyte Replacment Protocol based on the criteria listed below:   1. Is GFR >/= 50 ml/min? yes  Patient's GFR today is 19 2. Is urine output >/= 0.5 ml/kg/hr for the last 8 hours? yes Patient's UOP is 1.5 ml/kg/hr 3. Is BUN < 30 mg/dL? yes  Patient's BUN today is 19 4. Abnormal electrolyte(s):k 3.1 5. Ordered repletion with: per protocol 6. If a panic level lab has been reported, has the CCM MD in charge been notified? yes.   Physician:  Emi Holes, Brentyn Seehafer A 09/29/2011 6:25 AM

## 2011-09-29 NOTE — Progress Notes (Addendum)
Pt had temp of 101.3, while giving FFP, RN held FFP, called MD, gave order to give IV Ibuprophen and continue with FFP; BP was 141/73 HR 103, will continue to monitor, Lavonda Jumbo RN

## 2011-09-29 NOTE — Progress Notes (Signed)
Pt HR 140-150's, did ECG showed sinus tach with 2 degree type 1 HR 139, BP 140/75, called MD about HR and change of rhythm, before pt was Afib, temp was improved though at 99.7,  MD ordered to transfer to ICU, will continue to monitor, Thanks, Lavonda Jumbo RN

## 2011-09-29 NOTE — Care Management Note (Signed)
    Page 1 of 2   10/04/2011     2:18:03 PM   CARE MANAGEMENT NOTE 10/04/2011  Patient:  Tamara [name], Tamara [name]   Account Number:  [id number]  Date Initiated:  09/28/2011  Documentation initiated by:  [facility]  Subjective/Objective Assessment:   Abd pain - hypotension.     Action/Plan:   CIR consult- pt too high level for CIR  pt eval-recs hhpt   Anticipated DC Date:  10/04/2011   Anticipated DC Plan:  HOME W HOME HEALTH SERVICES      DC Planning Services  CM consult      [facility] Choice  HOME HEALTH   Choice offered to / List presented to:  C-4 Adult Children   DME arranged  3-N-1  Levan Hurst      DME agency  [facility]     HH arranged  HH-2 PT  HH-1 RN      [facility] agency  [facility]   Status of service:  Completed, signed off Medicare Important Message given?   (If response is "NO", the following Medicare IM given date fields will be blank) Date Medicare IM given:   Date Additional Medicare IM given:    Discharge Disposition:  HOME W HOME HEALTH SERVICES  Per UR Regulation:  Reviewed for med. necessity/level of care/duration of stay  If discussed at Long Length of Stay Meetings, dates discussed:    Comments:  ContactKylei, [place] Son [phone]  10/04/11 10:49 Letha Cape RN, BSN [phone] patient if for dc today or tomorrow per MD,  patient chose [facility] from agency list, referral made to [facility]  for hhpt/ hhrn Hilda Lias notified, and Jill Alexanders for rolling walker and bsc.  Soc will begin 24-48 hrs post discharge.  NCM will continue to follow for dc needs.  09-29-11 11:30am Avie Arenas, RNBSN [phone] Met in room with patient, daughter-in-law, Dewayne Hatch, and son, Tamara [name].  Patient moved back down to ICU last pm as patient went into Afib with RVR.   independent at home - Dewayne Hatch gets groceries if needed, transports if needed and is able to be home with her - or can get somone else if needed on discharge.  At home she has walking cane, bars on  toilet to push self up and walk in tub with seat.  Prepares own meals.  Have RW in basement if needed on discharge.   Will need PT consult - may need HH PT depending on deconditioning.  Has been inbed for two days. Discussed with PCCM physician who states he will place order.

## 2011-09-29 NOTE — Progress Notes (Signed)
Name: Tamara [name] MRN: [id number] DOB: 03-05-1924    LOS: 2  Referring Provider:  [facility] ED[facility] Reason for Referral:  Suspected sepsis  PULMONARY / CRITICAL CARE MEDICINE  Brief patient description:  78 yo with sudden onset emesis, diarrhea, lower abdominal pain and hypotension who initially presented to [facility] ED and was transferred to [facility] for further management.  Events Since Admission: 8/6 Presented to [facility] with sudden onset emesis, diarrhea, lower abdominal pain, transferred to [facility] 8/8 Fever, A fib RVR, multiple calls to elink>>transfer back to ICU  Current Status: Transferred back to ICU overnight due to a fib RVR and transiently on diltiazem gtt.  Abd pain improved, but still present.  Denies chest pain, cough, or sputum.  Vital Signs: Temp:  [98.2 F (36.8 C)-101.4 F (38.6 C)] 98.3 F (36.8 C) (08/08 0758) Pulse Rate:  [58-151] 84  (08/08 1000) Resp:  [13-24] 17  (08/08 1000) BP: (83-152)/(48-94) 106/69 mmHg (08/08 1000) SpO2:  [92 %-100 %] 97 % (08/08 1000) Weight:  [147 lb 12.8 oz (67.042 kg)-158 lb 8.2 oz (71.9 kg)] 158 lb 8.2 oz (71.9 kg) (08/08 0500)  Physical Examination: General:  No distress Neuro:  A&O x 3, normal strength HEENT:  PERRL, dry membranes Neck:  Supple, no JVD Cardiovascular:  Irregular Lungs:  CTAB Abdomen:  Tender LLQ Musculoskeletal:  No edema, moves all extremities Skin:  Intact  Ct Abdomen Pelvis Wo Contrast  09/28/2011  *RADIOLOGY REPORT*  Clinical Data: Increasing abdominal pain in the left lower quadrant.  Nausea and vomiting.  Previous hysterectomy and appendectomy.  CT ABDOMEN AND PELVIS WITHOUT CONTRAST  Technique:  Multidetector CT imaging of the abdomen and pelvis was performed following the standard protocol without intravenous contrast.  Comparison: CT scan and chest and abdomen radiographs dated 09/27/2011  Findings: The patient has a new consolidative pneumonia in the left lower lobe with a tiny effusion.  There is a new tiny right  effusion with focal atelectasis in the right lower lobe. Moderate hiatal hernia.  The patient has developed acute colitis of the descending colon extending from the splenic flexure into the proximal sigmoid region.  This is new since the prior study.  There is diffuse pericolonic inflammation without an abscess.  There is a small amount of new ascites in the pelvis and in the left pericolic gutter.  The liver, spleen, pancreas, left adrenal gland, and kidneys are normal.  There is a 14 mm low density lesion in the right adrenal gland consistent with benign adenoma.  Lipoma in the duodenum is again noted.  Cyst is noted on the right ovary, slightly smaller than on the prior study.  Left ovary is normal.  Uterus has been removed. Foley catheter in place.  No visible diverticula in the colon.  IMPRESSION: New acute colitis involving the descending colon with pericolonic inflammation and a small amount of ascites.  New left lower lobe pneumonia.  New tiny bilateral pleural effusions and right base atelectasis.  Original Report Authenticated By: Gwynn Burly, M.D.   Ct Abdomen Pelvis Wo Contrast  09/27/2011  *RADIOLOGY REPORT*  Clinical Data: Severe abdominal pain.  Low blood pressure.  CT ABDOMEN AND PELVIS WITHOUT CONTRAST  Technique:  Multidetector CT imaging of the abdomen and pelvis was performed following the standard protocol without intravenous contrast.  Comparison: None.  Findings: Minimal bilateral pleural effusions with atelectasis or infiltration in the lung bases.  Cardiac enlargement.  Coronary artery calcification.  Small esophageal hiatal hernia.  The unenhanced appearance  of the liver, spleen, gallbladder, left adrenal gland, kidneys, and retroperitoneal lymph nodes is unremarkable.  There is a hypodense nodule in the right adrenal gland measuring 1.8 cm diameter consistent with an adenoma. Calcification of the abdominal aorta without aneurysm.  No retroperitoneal hematomas.  Diffusely stool  filled colon without significant distension or wall thickening. 6 mm fat density lesion in the second portion of the duodenum consistent with lipoma.  The stomach and small bowel are not abnormally distended.  No free air or free fluid in the abdomen.  Prominent visceral adipose tissues.  Pelvis:  A the uterus appears to be surgically absent.  There is a right-sided pelvic mass measuring 3.2 x 4.2 cm, likely representing the right ovary.  This is a moderately enlarged for age and ultrasound should be considered for followup.  The bladder is decompressed with Foley catheter.  No free air or free fluid in the abdomen.  No significant pelvic lymphadenopathy.  The appendix is not visualized.  Degenerative changes in the lumbar spine.  IMPRESSION:  Small esophageal hiatal hernia.  Minimal bilateral pleural effusions with basilar atelectasis or infiltration.  Right adrenal gland nodule.  Prominence of the right ovary.  Consider ultrasound for follow-up.  Diffusely stool filled colon without bowel obstruction.  Duodenal lipoma.  Original Report Authenticated By: Marlon Pel, M.D.   Dg Chest Portable 1 View  09/28/2011  *RADIOLOGY REPORT*  Clinical Data: Right central line placement.  PORTABLE CHEST - 1 VIEW  Comparison: None.  Findings: The right central venous catheter placed with tip over the low right atrium.  If positioning as above the SVC / RA junction is desired, the catheter could be withdrawn about 7.7 cm. No pneumothorax.  Shallow inspiration.  Mild cardiac enlargement with normal pulmonary vascularity.  Interstitial changes suggesting fibrosis. Calcified and tortuous aorta.  No blunting of costophrenic angles. Mediastinal contours appear intact.  IMPRESSION: Right central venous catheter placed with tip over the low right atrium, about 7.7 cm below the SVC / RA junction.  No pneumothorax.  Original Report Authenticated By: Marlon Pel, M.D.   Dg Abd Acute W/chest  09/27/2011  *RADIOLOGY REPORT*   Clinical Data: Abdominal pain, shortness of breath  ACUTE ABDOMEN SERIES (ABDOMEN 2 VIEW & CHEST 1 VIEW)  Comparison: None.  Findings: Mild cardiomegaly with vascular congestion and chronic interstitial changes.  Negative for CHF or pneumonia.  No effusion or pneumothorax.  no definite free air.  Nonobstructive bowel gas pattern.  There is stool distally in the sigmoid region.  Mild nonspecific gaseous distention in the right abdomen.  Limited exam because of positioning.  Degenerative changes of the spine and hips.  Pelvic calcifications consistent with venous phleboliths. Calcification over the right iliac crest noted, nonspecific this could be an injection granuloma versus an appendicolith.  IMPRESSION: Cardiomegaly without CHF or pneumonia  Negative for obstruction or free air.  Original Report Authenticated By: Judie Petit. Ruel Favors, M.D.    ASSESSMENT AND PLAN  PULMONARY  Lab 09/27/11 2228  PHART 7.319*  PCO2ART 32.3*  PO2ART 231.0*  HCO3 16.1*  O2SAT 99.3    A:  No active issues.  Atelectasis on CT abd 8/07>>doubt PNA. P:   Supplemental oxygen PRN Goal SpO2>92 Bronchial hygiene  CARDIOVASCULAR  Lab 09/28/11 0540 09/27/11 2146  TROPONINI -- <0.30  LATICACIDVEN 4.8* 5.7*  PROBNP -- --   ECG:  8/6 >>> Atrial fibrillation, controlled rate, nonspecific ST-T changes Lines: R IJ CVL 8/6 >>>  A: Hypotension on the  background of volume depletion (emesis, diarrhea, diuretics, antihypertensives)>>resolved.  Hx of a fib with RVR 8/08>>imrpoved. P:  Hold lisinopril Add lopressor   RENAL  Lab 09/29/11 0500 09/28/11 2153 09/28/11 0415 09/27/11 2146  NA 140 138 141 138  K 3.1* 3.5 -- --  CL 105 104 106 104  CO2 27 24 20  17*  BUN 19 19 19 18   CREATININE 0.70 0.76 0.83 0.92  CALCIUM 8.2* 8.5 9.1 10.0  MG -- -- -- --  PHOS -- -- -- --   Intake/Output      08/07 0701 - 08/08 0700 08/08 0701 - 08/09 0700   I.V. (mL/kg) 913.3 (12.7) 20 (0.3)   Blood 316    IV Piggyback 675 137.5    Total Intake(mL/kg) 1904.3 (26.5) 157.5 (2.2)   Urine (mL/kg/hr) 1820 (1.1) 200 (0.7)   Total Output 1820 200   Net +84.3 -42.5         Foley:  8/6 >>>  A:  Hypokalemia.  Metabolic acidosis>>resolved. P:   Hold HCTZ Trend BMP Replace  GASTROINTESTINAL  Lab 09/27/11 2146  AST 23  ALT 15  ALKPHOS 99  BILITOT 0.4  PROT 5.6*  ALBUMIN 3.3*   Abd CT:  8/6 >>> Small esophageal hiatal hernia. Minimal bilateral pleural effusions with basilar atelectasis or infiltration. Right adrenal gland nodule. Prominence of the right ovary. Consider ultrasound for follow-up. Diffusely stool filled colon without bowel obstruction. Duodenal lipoma.  Abd CT: 8/7>>>descending colitis, LLL atx vs infiltrate  A:  Colitis.  Appreciate help from Surgery. P:   Start clears  HEMATOLOGIC  Lab 09/29/11 0500 09/28/11 0800 09/27/11 2146  HGB 11.9* 13.4 12.9  HCT 35.1* 38.9 37.7  PLT 148* 176 179  INR -- -- 2.11*  APTT -- -- --   A:  Coumadin induced coagulopathy (atrial fibrillation). P:  Will need to resume coumadin soon Use lovenox for now  INFECTIOUS  Lab 09/29/11 0500 09/28/11 0800 09/27/11 2147 09/27/11 2146  WBC 12.3* 15.2* -- 14.9*  PROCALCITON -- -- <0.10 --   Cultures: 8/6  Blood >>>  Antibiotics: 8/6  Zosyn >>>  A: Colitis.  Doubt PNA. P:   ABx/Cx as above  ENDOCRINE  Lab 09/29/11 0115 09/28/11 1946 09/28/11 0137  GLUCAP 92 129* 184*   A:  Hypothyroidism. P:   Hold Synthroid  NEUROLOGIC  A:  Acute encephalopathy (septic, pain Rx)>>resolved. P:   Monitor Hold Nemenda  Fentanyl PRN  BEST PRACTICE / DISPOSITION Level of Care:  ICU Primary Service: PCCM Consultants:  CCS Code Status:  Full Diet:  Clear DVT Px:  Full dose lovenox>>will need to resume coumadin GI Px:  Protonix Skin Integrity:  Intact Social / Family:  Updated family at bedside  Coralyn Helling, MD [facility] Pulmonary/Critical Care 09/29/2011, 11:16 AM Pager:  [phone] After 3pm call:  [phone]

## 2011-09-29 NOTE — Progress Notes (Signed)
ANTICOAGULATION CONSULT NOTE - Initial Consult  Pharmacy Consult for Lovenox  Indication: atrial fibrillation  No Known Allergies  Patient Measurements: Height: 5\' 10"  (177.8 cm) Weight: 158 lb 8.2 oz (71.9 kg) IBW/kg (Calculated) : 68.5   Vital Signs: Temp: 98.3 F (36.8 C) (08/08 0758) Temp src: Oral (08/08 0758) BP: 106/69 mmHg (08/08 1000) Pulse Rate: 84  (08/08 1000)  Labs:  Basename 09/29/11 0500 09/28/11 2153 09/28/11 0800 09/28/11 0415 09/27/11 2146  HGB 11.9* -- 13.4 -- --  HCT 35.1* -- 38.9 -- 37.7  PLT 148* -- 176 -- 179  APTT -- -- -- -- --  LABPROT -- -- -- -- 24.0*  INR -- -- -- -- 2.11*  HEPARINUNFRC -- -- -- -- --  CREATININE 0.70 0.76 -- 0.83 --  CKTOTAL -- -- -- -- --  CKMB -- -- -- -- --  TROPONINI -- -- -- -- <0.30    Estimated Creatinine Clearance: 56.6 ml/min (by C-G formula based on Cr of 0.7).   Medical History: Past Medical History  Diagnosis Date  . Hypertension   . Atrial fibrillation   . Osteoporosis   . Arthritis     Medications:  Scheduled:    . ibuprofen (CALDOLOR) IV  400 mg Intravenous Once  . levothyroxine  44 mcg Intravenous Daily  . metoprolol  2.5 mg Intravenous Q6H  . pantoprazole (PROTONIX) IV  40 mg Intravenous QHS  . piperacillin-tazobactam (ZOSYN)  IV  3.375 g Intravenous Q8H  . potassium chloride  10 mEq Intravenous Q1H  . potassium chloride  10 mEq Intravenous Q1 Hr x 3  . potassium chloride  10 mEq Intravenous Once  . potassium chloride  10 mEq Intravenous Q1 Hr x 4  . sodium chloride  10-40 mL Intracatheter Q12H    Assessment: Pt is a 54 YOF with a h/o A-fib on chronic warfarin therapy PTA. INR was 2.11 on admission(no f/u INRs) and warfarin has been held per MD since admission. Pharmacy is now consulted dose lovenox for a-fib. H/H 11.9/35.1, Plts 148, no evidence of bleeding reported.   Goal of Therapy:  Prevention of clot Monitor platelets by anticoagulation protocol: Yes   Plan:  1. Start lovenox  70mg  [place] q12h 2. CBC every 72 hours 3. Monitor for bleeding  Abran Duke, PharmD Clinical Pharmacist Phone: [phone] Pager: [phone] 09/29/2011 11:55 AM

## 2011-09-29 NOTE — Evaluation (Signed)
Physical Therapy Evaluation Patient Details Name: Tamara [name] MRN: [id number] DOB: 1925-02-03 Today's Date: 09/29/2011 Time: 1335-1410 PT Time Calculation (min): 35 min  PT Assessment / Plan / Recommendation Clinical Impression  pt presents with Colitis, Sepsis, and A-fib.  pt very motivated, but limited by weakness and deconditioning.  pt's HR increased to 130's during transfer.  pt would benefit from CIR at D/C pending progress.      PT Assessment  Patient needs continued PT services    Follow Up Recommendations  Inpatient Rehab    Barriers to Discharge None      Equipment Recommendations  Defer to next venue    Recommendations for Other Services Rehab consult;OT consult   Frequency Min 3X/week    Precautions / Restrictions Precautions Precautions: Fall Restrictions Weight Bearing Restrictions: No   Pertinent Vitals/Pain Denies pain.        Mobility  Bed Mobility Bed Mobility: Supine to Sit;Sitting - Scoot to Edge of Bed Supine to Sit: 4: Min assist;HOB elevated;With rails Sitting - Scoot to Edge of Bed: 3: Mod assist Details for Bed Mobility Assistance: pt attempts to try on her own with [facility] ~45degrees, however needs MinA with trunk.   Transfers Transfers: Sit to Stand;Stand to [employer] Transfers Sit to Stand: 3: Mod assist;With upper extremity assist;From bed Stand to Sit: 4: Min assist;With upper extremity assist;To bed Stand Pivot Transfers: 4: Min assist Details for Transfer Assistance: pt notes feeling very weak in standing and uses RW to perform SPT.  pt is able to stand ~3 mins before needing to sit in chair.  Cues for use of RW.   Ambulation/Gait Ambulation/Gait Assistance: Not tested (comment) Stairs: No Wheelchair Mobility Wheelchair Mobility: No    Exercises     PT Diagnosis: Difficulty walking;Generalized weakness  PT Problem List: Decreased strength;Decreased activity tolerance;Decreased balance;Decreased mobility;Decreased knowledge  of use of DME;Cardiopulmonary status limiting activity PT Treatment Interventions: DME instruction;Gait training;Stair training;Functional mobility training;Therapeutic activities;Therapeutic exercise;Balance training;Patient/family education   PT Goals Acute Rehab PT Goals PT Goal Formulation: With patient Time For Goal Achievement: 10/13/11 Potential to Achieve Goals: Good Pt will go Supine/Side to Sit: with modified independence PT Goal: Supine/Side to Sit - Progress: Goal set today Pt will go Sit to Supine/Side: with modified independence PT Goal: Sit to Supine/Side - Progress: Goal set today Pt will go Sit to Stand: with modified independence PT Goal: Sit to Stand - Progress: Goal set today Pt will go Stand to Sit: with modified independence PT Goal: Stand to Sit - Progress: Goal set today Pt will Ambulate: with modified independence;>150 feet;with least restrictive assistive device PT Goal: Ambulate - Progress: Goal set today  Visit Information  Last PT Received On: 09/29/11 Assistance Needed: +1    Subjective Data  Subjective: I want to do whatever I can to get better.   Patient Stated Goal: Independent   Prior Functioning  Home Living Lives With: Alone Available Help at Discharge: Family;Available 24 hours/day (If needed.  ) Type of Home: House Home Access: Level entry Home Layout: One level Bathroom Shower/Tub: Tub only (Walk-in tub with door.  ) Bathroom Toilet: Handicapped height Home Adaptive Equipment: Grab bars around toilet;Walker - rolling;Straight cane Prior Function Level of Independence: Independent Able to Take Stairs?: Yes Driving: Yes Vocation: Retired Musician: No difficulties    Cognition  Overall Cognitive Status: Appears within functional limits for tasks assessed/performed Arousal/Alertness: Awake/alert Orientation Level: Appears intact for tasks assessed Behavior During Session: [facility] for tasks performed  Extremity/Trunk Assessment Right Lower Extremity Assessment RLE ROM/Strength/Tone: Deficits RLE ROM/Strength/Tone Deficits: Grossly 4-/5 RLE Sensation: WFL - Light Touch Left Lower Extremity Assessment LLE ROM/Strength/Tone: Deficits LLE ROM/Strength/Tone Deficits: Grossly 4-/5 LLE Sensation: WFL - Light Touch Trunk Assessment Trunk Assessment: Kyphotic   Balance Balance Balance Assessed: Yes Static Standing Balance Static Standing - Balance Support: Bilateral upper extremity supported Static Standing - Level of Assistance: 5: Stand by assistance Static Standing - Comment/# of Minutes: pt able to stand for 3 mins with Bil UE supported.    End of Session PT - End of Session Equipment Utilized During Treatment: Gait belt Activity Tolerance: Patient limited by fatigue Patient left: in chair;with call bell/phone within reach;with nursing in room;with family/visitor present Nurse Communication: Mobility status  GP     Sunny Schlein, [place] [phone] 09/29/2011, 2:22 PM

## 2011-09-30 ENCOUNTER — Inpatient Hospital Stay (HOSPITAL_COMMUNITY): Payer: Medicare Other

## 2011-09-30 ENCOUNTER — Encounter (HOSPITAL_COMMUNITY): Payer: Self-pay

## 2011-09-30 DIAGNOSIS — K5289 Other specified noninfective gastroenteritis and colitis: Secondary | ICD-10-CM

## 2011-09-30 LAB — PREPARE FRESH FROZEN PLASMA: Unit division: 0

## 2011-09-30 LAB — PROTIME-INR
INR: 1.76 — ABNORMAL HIGH (ref 0.00–1.49)
Prothrombin Time: 20.8 seconds — ABNORMAL HIGH (ref 11.6–15.2)

## 2011-09-30 MED ORDER — WARFARIN - PHARMACIST DOSING INPATIENT: Freq: Every day | Status: DC

## 2011-09-30 MED ORDER — WARFARIN SODIUM 5 MG PO TABS
5.0000 mg | ORAL_TABLET | Freq: Once | ORAL | Status: AC
  Administered 2011-09-30: 5 mg via ORAL
  Filled 2011-09-30: qty 1

## 2011-09-30 MED ORDER — PANTOPRAZOLE SODIUM 40 MG PO TBEC
40.0000 mg | DELAYED_RELEASE_TABLET | Freq: Every day | ORAL | Status: DC
  Administered 2011-09-30 – 2011-10-02 (×3): 40 mg via ORAL
  Filled 2011-09-30 (×3): qty 1

## 2011-09-30 MED ORDER — METOPROLOL TARTRATE 25 MG PO TABS
25.0000 mg | ORAL_TABLET | Freq: Two times a day (BID) | ORAL | Status: DC
  Administered 2011-09-30 – 2011-10-04 (×9): 25 mg via ORAL
  Filled 2011-09-30 (×10): qty 1

## 2011-09-30 MED ORDER — LEVOTHYROXINE SODIUM 88 MCG PO TABS
88.0000 ug | ORAL_TABLET | Freq: Every day | ORAL | Status: DC
  Administered 2011-10-01 – 2011-10-04 (×4): 88 ug via ORAL
  Filled 2011-09-30 (×6): qty 1

## 2011-09-30 MED ORDER — POTASSIUM CHLORIDE CRYS ER 20 MEQ PO TBCR
40.0000 meq | EXTENDED_RELEASE_TABLET | Freq: Once | ORAL | Status: AC
  Administered 2011-09-30: 40 meq via ORAL
  Filled 2011-09-30: qty 2

## 2011-09-30 MED ORDER — PNEUMOCOCCAL VAC POLYVALENT 25 MCG/0.5ML IJ INJ
0.5000 mL | INJECTION | INTRAMUSCULAR | Status: AC
  Administered 2011-10-01: 0.5 mL via INTRAMUSCULAR
  Filled 2011-09-30: qty 0.5

## 2011-09-30 NOTE — Progress Notes (Signed)
ANTICOAGULATION CONSULT NOTE - Follow Up Consult  Pharmacy Consult for Lovenox/Warfarin Indication: atrial fibrillation  No Known Allergies  Patient Measurements: Height: 5\' 10"  (177.8 cm) Weight: 158 lb 8.2 oz (71.9 kg) IBW/kg (Calculated) : 68.5   Vital Signs: Temp: 98.1 F (36.7 C) (08/09 0836) Temp src: Oral (08/09 0836) BP: 128/75 mmHg (08/09 0800) Pulse Rate: 101  (08/09 0800)  Labs:  Basename 09/30/11 0410 09/29/11 0500 09/28/11 2153 09/28/11 0800 09/28/11 0415 09/27/11 2146  HGB -- 11.9* -- 13.4 -- --  HCT -- 35.1* -- 38.9 -- 37.7  PLT -- 148* -- 176 -- 179  APTT -- -- -- -- -- --  LABPROT 20.8* -- -- -- -- 24.0*  INR 1.76* -- -- -- -- 2.11*  HEPARINUNFRC -- -- -- -- -- --  CREATININE -- 0.70 0.76 -- 0.83 --  CKTOTAL -- -- -- -- -- --  CKMB -- -- -- -- -- --  TROPONINI -- -- -- -- -- <0.30    Estimated Creatinine Clearance: 56.6 ml/min (by C-G formula based on Cr of 0.7).   Medications:  Patient takes home warfarin 5mg  every day except 2.5mg  on Tuesdays.    Assessment: Patient is a 78 year old female with a history of atrial fibrillation on chronic warfarin prior to admission. Current INR is 1.76. Has received FFP while in hospital and her last dose of warfarin was on 8/6. Warfarin is to be restarted today. Labs on 8/8 reveal hgb/hct 11.0/35.1.  No bleeding reported. No drug interactions have been noted in the patients med profile.   Patient continues on full dose Lovenox.   Goal of Therapy:  INR 2-3 Monitor platelets by anticoagulation protocol: Yes   Plan:  Continue Lovenox 70mg  subq every 12 hours  Start warfarin 5mg  today Monitor INR daily  Stop Lovenox when INR > 2 Monitor CBC every 3 days until Lovenox stopped   Micheline Chapman 09/30/2011,10:57 AM

## 2011-09-30 NOTE — Progress Notes (Signed)
I have reviewed the above patient with the student and agree with her assessment and plan. Tamara [name] was on warfarin pta and it was being held d/t possible procedures. INR is subtherapeutic today but I really expected it to be much lower after missing doses and FFP transfusion. No bleeding noted. She continues on full dose lovenox as bridge therapy. Will attempt to resume her home dose of warfarin as possible. Plan: Warfarin 5mg  tonight INR daily Continue lovenox CBC in 72h if still on lovenox (cbc ordered for 8/10)  Sheppard Coil PharmD [phone]

## 2011-09-30 NOTE — Progress Notes (Signed)
General Surgery Note  LOS: 3 days  Room - 2106  Assessment/Plan: 1. Colitis, left colon/sigmoid colon  Medical treatment for now - hydration, antibiotics - and no role for surgery.   On Zosyn.  AM labs pending.  On clear liquids.  2. SIRS - better  Lactic Acid - 4.8 - 09/28/2011   3. A. Fib - rate better  Off diltiazem, on lopressor 4. Constipation   The patient takes chronic laxatives and CT scan findings a colon full of stool are consistent with this.  5. Anticoagulated - since there is no immediate plan for surgery, there is no reason to continue reversal of coumadin.  PT/INR - 20.8/1.76 - 09/30/2011  6. Hypokalemia - today's labs pending  3.1 - 09/29/2011 7.  Left lower lobe atelectasis vs pneumonia by CT scan - 09/28/2011 8.  Physical activity - should get out of bed.  Subjective:  Tolerating clear liquids.  No flatus or BM.  Still has left mid abdominal tenderness. Objective:   Filed Vitals:   09/30/11 0600  BP: 128/88  Pulse: 97  Temp:   Resp: 15     Intake/Output from previous day:  08/08 0701 - 08/09 0700 In: 1225 [P.O.:225; I.V.:740; IV Piggyback:260] Out: 1250 [Urine:1250]  Intake/Output this shift:      Physical Exam:   General: WN older WF who is alert and oriented.   She continues to look better   HEENT: Normal. Pupils equal. .   Lungs: Clear with reasonable inspiration.   Abdomen: Localized tenderness left mid abdomen to left flank.  BS present, but decreased.   Psychiatric: Has normal mood and affect.    Lab Results:     Basename 09/29/11 0500 09/28/11 0800  WBC 12.3* 15.2*  HGB 11.9* 13.4  HCT 35.1* 38.9  PLT 148* 176    BMET    Basename 09/29/11 0500 09/28/11 2153  NA 140 138  K 3.1* 3.5  CL 105 104  CO2 27 24  GLUCOSE 86 99  BUN 19 19  CREATININE 0.70 0.76  CALCIUM 8.2* 8.5    PT/INR    Basename 09/30/11 0410 09/27/11 2146  LABPROT 20.8* 24.0*  INR 1.76* 2.11*    ABG    Basename 09/27/11 2228  PHART 7.319*  HCO3 16.1*      Studies/Results:  Ct Abdomen Pelvis Wo Contrast  09/28/2011  *RADIOLOGY REPORT*  Clinical Data: Increasing abdominal pain in the left lower quadrant.  Nausea and vomiting.  Previous hysterectomy and appendectomy.  CT ABDOMEN AND PELVIS WITHOUT CONTRAST  Technique:  Multidetector CT imaging of the abdomen and pelvis was performed following the standard protocol without intravenous contrast.  Comparison: CT scan and chest and abdomen radiographs dated 09/27/2011  Findings: The patient has a new consolidative pneumonia in the left lower lobe with a tiny effusion.  There is a new tiny right effusion with focal atelectasis in the right lower lobe. Moderate hiatal hernia.  The patient has developed acute colitis of the descending colon extending from the splenic flexure into the proximal sigmoid region.  This is new since the prior study.  There is diffuse pericolonic inflammation without an abscess.  There is a small amount of new ascites in the pelvis and in the left pericolic gutter.  The liver, spleen, pancreas, left adrenal gland, and kidneys are normal.  There is a 14 mm low density lesion in the right adrenal gland consistent with benign adenoma.  Lipoma in the duodenum is again noted.  Cyst is  noted on the right ovary, slightly smaller than on the prior study.  Left ovary is normal.  Uterus has been removed. Foley catheter in place.  No visible diverticula in the colon.  IMPRESSION: New acute colitis involving the descending colon with pericolonic inflammation and a small amount of ascites.  New left lower lobe pneumonia.  New tiny bilateral pleural effusions and right base atelectasis.  Original Report Authenticated By: Gwynn Burly, M.D.     Anti-infectives:   Anti-infectives     Start     Dose/Rate Route Frequency Ordered Stop   09/28/11 0600   piperacillin-tazobactam (ZOSYN) IVPB 3.375 g        3.375 g 12.5 mL/hr over 240 Minutes Intravenous Every 8 hours 09/28/11 0249     09/27/11 2230    piperacillin-tazobactam (ZOSYN) IVPB 3.375 g        3.375 g 12.5 mL/hr over 240 Minutes Intravenous  Once 09/27/11 2228 09/27/11 2315   09/27/11 2230   vancomycin (VANCOCIN) IVPB 1000 mg/200 mL premix        1,000 mg 200 mL/hr over 60 Minutes Intravenous  Once 09/27/11 2228 09/28/11 0045          Ovidio Kin, MD, FACS Pager: [phone],   [facility] Office: [phone] 09/30/2011

## 2011-09-30 NOTE — Progress Notes (Signed)
Name: FLORANCE PAOLILLO MRN: [id number] DOB: 1925/02/08    LOS: 3  Referring Provider:  [facility] Reason for Referral:  Suspected sepsis  PULMONARY / CRITICAL CARE MEDICINE  Brief patient description:  78 yo with sudden onset emesis, diarrhea, lower abdominal pain and hypotension who initially presented to [facility] ED and was transferred to [facility] for further management.  Events Since Admission: 8/6 Presented to [facility] with sudden onset emesis, diarrhea, lower abdominal pain, transferred to [facility] 8/8 Fever, A fib RVR, multiple calls to elink>>transfer back to ICU  Current Status: Sitting in chair.  Still has abd pain, but improved.  Tolerating clears>>she doesn't think she can do more than that now.  Vital Signs: Temp:  [97.6 F (36.4 C)-98.9 F (37.2 C)] 98.1 F (36.7 C) (08/09 0836) Pulse Rate:  [84-134] 101  (08/09 0800) Resp:  [15-28] 21  (08/09 0800) BP: (91-140)/(57-105) 128/75 mmHg (08/09 0800) SpO2:  [90 %-97 %] 96 % (08/09 0800)  Physical Examination: General:  No distress Neuro:  A&O x 3, normal strength HEENT:  PERRL, dry membranes Neck:  Supple, no JVD Cardiovascular:  Irregular Lungs:  CTAB Abdomen:  Tender LLQ Musculoskeletal:  No edema, moves all extremities Skin:  Intact  Ct Abdomen Pelvis Wo Contrast  09/28/2011  *RADIOLOGY REPORT*  Clinical Data: Increasing abdominal pain in the left lower quadrant.  Nausea and vomiting.  Previous hysterectomy and appendectomy.  CT ABDOMEN AND PELVIS WITHOUT CONTRAST  Technique:  Multidetector CT imaging of the abdomen and pelvis was performed following the standard protocol without intravenous contrast.  Comparison: CT scan and chest and abdomen radiographs dated 09/27/2011  Findings: The patient has a new consolidative pneumonia in the left lower lobe with a tiny effusion.  There is a new tiny right effusion with focal atelectasis in the right lower lobe. Moderate hiatal hernia.  The patient has developed acute colitis of the descending colon  extending from the splenic flexure into the proximal sigmoid region.  This is new since the prior study.  There is diffuse pericolonic inflammation without an abscess.  There is a small amount of new ascites in the pelvis and in the left pericolic gutter.  The liver, spleen, pancreas, left adrenal gland, and kidneys are normal.  There is a 14 mm low density lesion in the right adrenal gland consistent with benign adenoma.  Lipoma in the duodenum is again noted.  Cyst is noted on the right ovary, slightly smaller than on the prior study.  Left ovary is normal.  Uterus has been removed. Foley catheter in place.  No visible diverticula in the colon.  IMPRESSION: New acute colitis involving the descending colon with pericolonic inflammation and a small amount of ascites.  New left lower lobe pneumonia.  New tiny bilateral pleural effusions and right base atelectasis.  Original Report Authenticated By: Gwynn Burly, M.D.   Dg Chest Port 1 View  09/30/2011  *RADIOLOGY REPORT*  Clinical Data: Follow-up evaluation of left lower lobe atelectasis or infiltrate.  PORTABLE CHEST - 1 VIEW  Comparison: 09/28/2011.  Findings: There is a right-sided internal jugular central venous catheter with tip terminating in the inferior aspect of the right atrium (approximately 6.5 cm below the superior cavoatrial junction). The lung volumes are low.  There are bibasilar opacities (left greater than right), which may represent areas of atelectasis and/or consolidation.  Small bilateral pleural effusions (left greater than right).  Pulmonary vasculature is within normal limits.  Heart size is mildly enlarged (unchanged). The patient  is rotated to the right on today's exam, resulting in distortion of the mediastinal contours and reduced diagnostic sensitivity and specificity for mediastinal pathology.  Atherosclerosis in the thoracic aorta.  IMPRESSION: 1.  Tip of right IJ central venous catheter is in the inferior aspect of the right  atrium and could be retracted approximately 6.5 cm for more optimal placement with tip at the superior cavoatrial junction. 2.  Decreasing lung volumes with increasing bibasilar areas of atelectasis and/or consolidation, with increasing small bilateral pleural effusions. 3.  Mild cardiomegaly. 4.  Atherosclerosis.  Original Report Authenticated By: Florencia Reasons, M.D.    ASSESSMENT AND PLAN  PULMONARY  Lab 09/27/11 2228  PHART 7.319*  PCO2ART 32.3*  PO2ART 231.0*  HCO3 16.1*  O2SAT 99.3    A:  No active issues.  Atelectasis on CT abd 8/07>>doubt PNA. P:   Supplemental oxygen PRN Goal SpO2>92 Bronchial hygiene  CARDIOVASCULAR  Lab 09/28/11 0540 09/27/11 2146  TROPONINI -- <0.30  LATICACIDVEN 4.8* 5.7*  PROBNP -- --   ECG:  8/6 >>> Atrial fibrillation, controlled rate, nonspecific ST-T changes Lines: R IJ CVL 8/6 >>>  A: Hypotension on the background of volume depletion (emesis, diarrhea, diuretics, antihypertensives)>>resolved.  Hx of a fib with RVR 8/08>>improved. P:  Change lopressor to po Will ask pharmacy to resume coumadin and then d/c lovenox when INR therapeutic   RENAL  Lab 09/29/11 0500 09/28/11 2153 09/28/11 0415 09/27/11 2146  NA 140 138 141 138  K 3.1* 3.5 -- --  CL 105 104 106 104  CO2 27 24 20  17*  BUN 19 19 19 18   CREATININE 0.70 0.76 0.83 0.92  CALCIUM 8.2* 8.5 9.1 10.0  MG -- -- -- --  PHOS -- -- -- --   Intake/Output      08/08 0701 - 08/09 0700 08/09 0701 - 08/10 0700   P.O. 225    I.V. (mL/kg) 780 (10.8) 40 (0.6)   Blood     IV Piggyback 260    Total Intake(mL/kg) 1265 (17.6) 40 (0.6)   Urine (mL/kg/hr) 1250 (0.7)    Total Output 1250    Net +15 +40         Foley:  8/6 >>>  A:  Hypokalemia.  Metabolic acidosis>>resolved. P:   Hold HCTZ Trend BMP Replace  GASTROINTESTINAL  Lab 09/27/11 2146  AST 23  ALT 15  ALKPHOS 99  BILITOT 0.4  PROT 5.6*  ALBUMIN 3.3*   Abd CT:  8/6 >>> Small esophageal hiatal hernia. Minimal  bilateral pleural effusions with basilar atelectasis or infiltration. Right adrenal gland nodule. Prominence of the right ovary. Consider ultrasound for follow-up. Diffusely stool filled colon without bowel obstruction. Duodenal lipoma.  Abd CT: 8/7>>>descending colitis, LLL atx vs infiltrate  A:  Colitis.  Appreciate help from Surgery. P:   Continue clears No plans for surgical intervention at this time  HEMATOLOGIC  Lab 09/30/11 0410 09/29/11 0500 09/28/11 0800 09/27/11 2146  HGB -- 11.9* 13.4 12.9  HCT -- 35.1* 38.9 37.7  PLT -- 148* 176 179  INR 1.76* -- -- 2.11*  APTT -- -- -- --   A:  Coumadin induced coagulopathy (atrial fibrillation). P:  Resume coumadin>>dose per pharmacy  INFECTIOUS  Lab 09/29/11 0500 09/28/11 0800 09/27/11 2147 09/27/11 2146  WBC 12.3* 15.2* -- 14.9*  PROCALCITON -- -- <0.10 --   Cultures: 8/6  Blood >>>  Antibiotics: 8/6  Zosyn >>>  A: Colitis.  Doubt PNA. P:   ABx/Cx as  above  ENDOCRINE  Lab 09/29/11 0115 09/28/11 1946 09/28/11 0137  GLUCAP 92 129* 184*   A:  Hypothyroidism. P:   Continue synthroid  NEUROLOGIC  A:  Acute encephalopathy (septic, pain Rx)>>resolved. P:   Monitor Hold Nemenda  Fentanyl PRN  BEST PRACTICE / DISPOSITION Level of Care:  Telemetry Primary Service: Transfer to triad 8/10 and PCCM sign off Consultants:  CCS Code Status:  Full Diet:  Clear DVT Px:  Full dose lovenox>>resume coumadin GI Px:  Protonix Skin Integrity:  Intact Social / Family:  Updated family at bedside  Coralyn Helling, MD [facility] Pulmonary/Critical Care 09/30/2011, 9:52 AM Pager:  [phone] After 3pm call: [phone]

## 2011-10-01 DIAGNOSIS — K5289 Other specified noninfective gastroenteritis and colitis: Secondary | ICD-10-CM

## 2011-10-01 DIAGNOSIS — D689 Coagulation defect, unspecified: Secondary | ICD-10-CM

## 2011-10-01 LAB — CBC
Hemoglobin: 12.7 g/dL (ref 12.0–15.0)
MCH: 31.1 pg (ref 26.0–34.0)
Platelets: 173 10*3/uL (ref 150–400)
RBC: 4.09 MIL/uL (ref 3.87–5.11)
WBC: 7.4 10*3/uL (ref 4.0–10.5)

## 2011-10-01 LAB — BASIC METABOLIC PANEL
Calcium: 9 mg/dL (ref 8.4–10.5)
GFR calc Af Amer: >90 mL/min (ref >90)
GFR calc non Af Amer: 79 mL/min — ABNORMAL LOW (ref >90)
Potassium: 3.3 mEq/L — ABNORMAL LOW (ref 3.5–5.1)
Sodium: 139 mEq/L (ref 135–145)

## 2011-10-01 LAB — PROTIME-INR
INR: 2.04 — ABNORMAL HIGH (ref 0.00–1.49)
Prothrombin Time: 23.4 seconds — ABNORMAL HIGH (ref 11.6–15.2)

## 2011-10-01 MED ORDER — WARFARIN SODIUM 5 MG PO TABS
5.0000 mg | ORAL_TABLET | Freq: Once | ORAL | Status: AC
  Administered 2011-10-01: 5 mg via ORAL
  Filled 2011-10-01: qty 1

## 2011-10-01 MED ORDER — HALOPERIDOL LACTATE 5 MG/ML IJ SOLN
1.0000 mg | Freq: Four times a day (QID) | INTRAMUSCULAR | Status: DC | PRN
  Filled 2011-10-01: qty 0.2

## 2011-10-01 NOTE — Progress Notes (Signed)
<principal problem not specified>  Subjective: She feels much better today. Her pain has resolved. She did have a loose stool of stool today. The C. Difficile has been sent is pending. Did have a little nausea last night but none this morning.  Objective: Vital signs in last 24 hours: Temp:  [97.4 F (36.3 C)-98.6 F (37 C)] 98 F (36.7 C) (08/10 0456) Pulse Rate:  [77-120] 94  (08/10 0456) Resp:  [20-22] 20  (08/10 0456) BP: (113-141)/(72-86) 139/84 mmHg (08/10 0456) SpO2:  [93 %-97 %] 93 % (08/10 0456) Weight:  [164 lb 10.9 oz (74.7 kg)] 164 lb 10.9 oz (74.7 kg) (08/10 0357) Last BM Date: 09/30/11  Intake/Output from previous day: 08/09 0701 - 08/10 0700 In: 1360 [P.O.:1320; I.V.:40] Out: 1650 [Urine:1650] Intake/Output this shift: Total I/O In: 240 [P.O.:240] Out: 300 [Urine:300]  General appearance: alert, cooperative and no distress GI: soft, non-tender; bowel sounds normal; no masses,  no organomegaly  Lab Results:  Results for orders placed during the hospital encounter of 09/27/11 (from the past 24 hour(s))  BASIC METABOLIC PANEL     Status: Abnormal   Collection Time   10/01/11  6:05 AM      Component Value Range   Sodium 139  135 - 145 mEq/L   Potassium 3.3 (*) 3.5 - 5.1 mEq/L   Chloride 103  96 - 112 mEq/L   CO2 28  19 - 32 mEq/L   Glucose, Bld 88  70 - 99 mg/dL   BUN 13  6 - 23 mg/dL   Creatinine, Ser 1.61  0.50 - 1.10 mg/dL   Calcium 9.0  8.4 - 09.6 mg/dL   GFR calc non Af Amer 79 (*) >90 mL/min   GFR calc Af Amer >90  >90 mL/min  CBC     Status: Normal   Collection Time   10/01/11  6:05 AM      Component Value Range   WBC 7.4  4.0 - 10.5 K/uL   RBC 4.09  3.87 - 5.11 MIL/uL   Hemoglobin 12.7  12.0 - 15.0 g/dL   HCT 04.5  40.9 - 81.1 %   MCV 90.7  78.0 - 100.0 fL   MCH 31.1  26.0 - 34.0 pg   MCHC 34.2  30.0 - 36.0 g/dL   RDW 91.4  78.2 - 95.6 %   Platelets 173  150 - 400 K/uL  PROTIME-INR     Status: Abnormal   Collection Time   10/01/11  6:05 AM        Component Value Range   Prothrombin Time 23.4 (*) 11.6 - 15.2 seconds   INR 2.04 (*) 0.00 - 1.49     Studies/Results Radiology     MEDS, Scheduled    . enoxaparin (LOVENOX) injection  1 mg/kg Subcutaneous Q12H  . levothyroxine  88 mcg Oral QAC breakfast  . metoprolol tartrate  25 mg Oral BID  . pantoprazole  40 mg Oral Q1200  . piperacillin-tazobactam (ZOSYN)  IV  3.375 g Intravenous Q8H  . pneumococcal 23 valent vaccine  0.5 mL Intramuscular Tomorrow-1000  . potassium chloride  40 mEq Oral Once  . sodium chloride  10-40 mL Intracatheter Q12H  . warfarin  5 mg Oral ONCE-1800  . warfarin  5 mg Oral ONCE-1800  . Warfarin - Pharmacist Dosing Inpatient   Does not apply q1800     Assessment: <principal problem not specified> Clinically improved  Plan: No role for surgery that we can see. We will  see her again PRN  LOS: 4 days    Currie Paris, MD, [facility], [place] [phone]   10/01/2011 11:26 AM

## 2011-10-01 NOTE — Progress Notes (Addendum)
ANTICOAGULATION and ANTIBIOTIC CONSULT NOTE - Follow Up Consult  Pharmacy Consult for Lovenox/Warfarin Indication: atrial fibrillation AND suspected Sepsis   No Known Allergies  Patient Measurements: Height: 5\' 10"  (177.8 cm) Weight: 164 lb 10.9 oz (74.7 kg) IBW/kg (Calculated) : 68.5   Vital Signs: Temp: 98 F (36.7 C) (08/10 0456) Temp src: Oral (08/10 0456) BP: 139/84 mmHg (08/10 0456) Pulse Rate: 94  (08/10 0456)  Labs:  Basename 10/01/11 0605 09/30/11 0410 09/29/11 0500 09/28/11 2153  HGB 12.7 -- 11.9* --  HCT 37.1 -- 35.1* --  PLT 173 -- 148* --  APTT -- -- -- --  LABPROT 23.4* 20.8* -- --  INR 2.04* 1.76* -- --  HEPARINUNFRC -- -- -- --  CREATININE 0.65 -- 0.70 0.76  CKTOTAL -- -- -- --  CKMB -- -- -- --  TROPONINI -- -- -- --    Estimated Creatinine Clearance: 56.6 ml/min (by C-G formula based on Cr of 0.65).   Medications:  Patient takes home warfarin 5mg  every day except 2.5mg  on Tuesdays.    Assessment: Anticoagulation: Patient is a 78 year old female with a history of atrial fibrillation on chronic warfarin prior to admission. Has received FFP while in hospital and warfarin was held for possible procedure. Warfarin restarted 8/9 and bridged with Lovenox. INR today is 2.04. Will continue Lovenox for 1 more day before d/c since INR is now within normal limits. Hg up 12.7.   Infectious Disease: Pt currently on zosyn for suspected sepsis. BCx NGTD. Pt is afebrile and WBC are now wnl.   Goal of Therapy:  INR 2-3 Monitor platelets by anticoagulation protocol: Yes   Plan:  1) Continue Lovenox 70mg  subq q12h (D/C after AM dose on 8/11 if INR > 2) 2) Warfarin 5mg  x 1 dose  3) Monitor INR daily  4) D/C Lovenox after AM dose on 8/11 5) Continue zosyn 3.375g q8h   Alyda Megna 10/01/2011,8:48 AM

## 2011-10-01 NOTE — Progress Notes (Signed)
[facility] PROGRESS NOTE  Tamara [name] ZOX:[id number] DOB: 1924-06-27 DOA: 09/27/2011   Assessment/Plan: Hypotension (09/28/2011)/SIRS (systemic inflammatory response syndrome) (09/28/2011) -2/2 volume depletion and Bp Meds., now resolved.  Dehydration (09/28/2011) -resolved with IV fluids.  Abdominal pain (09/28/2011) -2/2 to colitis, continue zosyn. No c.dif order. -check c. Dif pcr. -appreciate Surgery assistance.  Atrial fibrillation (09/28/2011)/Coagulopathy (09/28/2011 -rate controlled on metoprolol. -INR therapeutic  Encephalopathy acute (09/28/2011) -resolved. -resume namenda -Haldol PRN  Acidosis (09/28/2011)  -resolved, 2/2 to hypotention  Code Status: full  Family Communication:  Disposition Plan: Son [id number]    LOS: 4 days   Procedures: CT abdomen: New acute colitis involving the descending colon with pericolonic inflammation and a small amount of ascites  Antibiotics:  zosyn  Interim History: 78 yo with sudden onset emesis, diarrhea, lower abdominal pain and hypotension who initially presented to [facility] and was transferred to [facility] for further management by PCCM. CT done showed colitis, lactic acid high required no pressor, was hypotensive due to vol. depletion and SIRS. surgery consulted.  Subjective: Patient relates she feels much improved still having diarrhea but has more energy and able to get up and walk.  Objective: Filed Vitals:   09/30/11 1326 09/30/11 2100 10/01/11 0357 10/01/11 0456  BP: 125/85 141/86  139/84  Pulse: 77 94  94  Temp: 97.4 F (36.3 C) 98.6 F (37 C)  98 F (36.7 C)  TempSrc: Oral Oral  Oral  Resp: 22 20  20   Height:      Weight:   74.7 kg (164 lb 10.9 oz)   SpO2: 96% 97%  93%    Intake/Output Summary (Last 24 hours) at 10/01/11 0957 Last data filed at 10/01/11 0059  Gross per 24 hour  Intake   1200 ml  Output   1650 ml  Net   -450 ml   Weight change:   Exam:  General: Alert, awake, oriented x3, in no acute  distress.  HEENT: No bruits, no goiter.  Heart: Regular rate and rhythm, without murmurs, rubs, gallops.  Lungs: Good air movement, bilateral air movement.  Abdomen: Soft, nontender, nondistended, positive bowel sounds.  Neuro: Grossly intact, nonfocal.   Data Reviewed: Basic Metabolic Panel:  Lab 10/01/11 5621 09/29/11 0500 09/28/11 2153 09/28/11 0415 09/27/11 2146  NA 139 140 138 141 138  K 3.3* 3.1* -- -- --  CL 103 105 104 106 104  CO2 28 27 24 20  17*  GLUCOSE 88 86 99 214* 232*  BUN 13 19 19 19 18   CREATININE 0.65 0.70 0.76 0.83 0.92  CALCIUM 9.0 8.2* 8.5 9.1 10.0  MG -- -- -- -- --  PHOS -- -- -- -- --   Liver Function Tests:  Lab 09/27/11 2146  AST 23  ALT 15  ALKPHOS 99  BILITOT 0.4  PROT 5.6*  ALBUMIN 3.3*    Lab 09/28/11 0415 09/27/11 2146  LIPASE -- 45  AMYLASE 73 --   No results found for this basename: AMMONIA:5 in the last 168 hours CBC:  Lab 10/01/11 0605 09/29/11 0500 09/28/11 0800 09/27/11 2146  WBC 7.4 12.3* 15.2* 14.9*  NEUTROABS -- -- 14.1* 12.6*  HGB 12.7 11.9* 13.4 12.9  HCT 37.1 35.1* 38.9 37.7  MCV 90.7 91.9 91.3 92.0  PLT 173 148* 176 179   Cardiac Enzymes:  Lab 09/27/11 2146  CKTOTAL --  CKMB --  CKMBINDEX --  TROPONINI <0.30   BNP: No components found with this basename: POCBNP:5 CBG:  Lab 09/29/11 0115  09/28/11 1946 09/28/11 0137  GLUCAP 92 129* 184*    Recent Results (from the past 240 hour(s))  CULTURE, BLOOD (ROUTINE X 2)     Status: Normal (Preliminary result)   Collection Time   09/27/11 10:30 PM      Component Value Range Status Comment   Specimen Description BLOOD RIGHT ARM DRAWN BY RN   Final    Special Requests BOTTLES DRAWN AEROBIC AND ANAEROBIC 6CC   Final    Culture NO GROWTH 3 DAYS   Final    Report Status PENDING   Incomplete   CULTURE, BLOOD (ROUTINE X 2)     Status: Normal (Preliminary result)   Collection Time   09/27/11 10:30 PM      Component Value Range Status Comment   Specimen Description BLOOD  LEFT HAND   Final    Special Requests BOTTLES DRAWN AEROBIC AND ANAEROBIC 6CC   Final    Culture NO GROWTH 3 DAYS   Final    Report Status PENDING   Incomplete   MRSA PCR SCREENING     Status: Normal   Collection Time   09/28/11  1:31 AM      Component Value Range Status Comment   MRSA by PCR NEGATIVE  NEGATIVE Final      Studies: Ct Abdomen Pelvis Wo Contrast  09/28/2011  *RADIOLOGY REPORT*  Clinical Data: Increasing abdominal pain in the left lower quadrant.  Nausea and vomiting.  Previous hysterectomy and appendectomy.  CT ABDOMEN AND PELVIS WITHOUT CONTRAST  Technique:  Multidetector CT imaging of the abdomen and pelvis was performed following the standard protocol without intravenous contrast.  Comparison: CT scan and chest and abdomen radiographs dated 09/27/2011  Findings: The patient has a new consolidative pneumonia in the left lower lobe with a tiny effusion.  There is a new tiny right effusion with focal atelectasis in the right lower lobe. Moderate hiatal hernia.  The patient has developed acute colitis of the descending colon extending from the splenic flexure into the proximal sigmoid region.  This is new since the prior study.  There is diffuse pericolonic inflammation without an abscess.  There is a small amount of new ascites in the pelvis and in the left pericolic gutter.  The liver, spleen, pancreas, left adrenal gland, and kidneys are normal.  There is a 14 mm low density lesion in the right adrenal gland consistent with benign adenoma.  Lipoma in the duodenum is again noted.  Cyst is noted on the right ovary, slightly smaller than on the prior study.  Left ovary is normal.  Uterus has been removed. Foley catheter in place.  No visible diverticula in the colon.  IMPRESSION: New acute colitis involving the descending colon with pericolonic inflammation and a small amount of ascites.  New left lower lobe pneumonia.  New tiny bilateral pleural effusions and right base atelectasis.   Original Report Authenticated By: Gwynn Burly, M.D.   Ct Abdomen Pelvis Wo Contrast  09/27/2011  *RADIOLOGY REPORT*  Clinical Data: Severe abdominal pain.  Low blood pressure.  CT ABDOMEN AND PELVIS WITHOUT CONTRAST  Technique:  Multidetector CT imaging of the abdomen and pelvis was performed following the standard protocol without intravenous contrast.  Comparison: None.  Findings: Minimal bilateral pleural effusions with atelectasis or infiltration in the lung bases.  Cardiac enlargement.  Coronary artery calcification.  Small esophageal hiatal hernia.  The unenhanced appearance of the liver, spleen, gallbladder, left adrenal gland, kidneys, and retroperitoneal lymph nodes is unremarkable.  There is a hypodense nodule in the right adrenal gland measuring 1.8 cm diameter consistent with an adenoma. Calcification of the abdominal aorta without aneurysm.  No retroperitoneal hematomas.  Diffusely stool filled colon without significant distension or wall thickening. 6 mm fat density lesion in the second portion of the duodenum consistent with lipoma.  The stomach and small bowel are not abnormally distended.  No free air or free fluid in the abdomen.  Prominent visceral adipose tissues.  Pelvis:  A the uterus appears to be surgically absent.  There is a right-sided pelvic mass measuring 3.2 x 4.2 cm, likely representing the right ovary.  This is a moderately enlarged for age and ultrasound should be considered for followup.  The bladder is decompressed with Foley catheter.  No free air or free fluid in the abdomen.  No significant pelvic lymphadenopathy.  The appendix is not visualized.  Degenerative changes in the lumbar spine.  IMPRESSION:  Small esophageal hiatal hernia.  Minimal bilateral pleural effusions with basilar atelectasis or infiltration.  Right adrenal gland nodule.  Prominence of the right ovary.  Consider ultrasound for follow-up.  Diffusely stool filled colon without bowel obstruction.  Duodenal  lipoma.  Original Report Authenticated By: Marlon Pel, M.D.   Dg Chest Port 1 View  09/30/2011  *RADIOLOGY REPORT*  Clinical Data: Follow-up evaluation of left lower lobe atelectasis or infiltrate.  PORTABLE CHEST - 1 VIEW  Comparison: 09/28/2011.  Findings: There is a right-sided internal jugular central venous catheter with tip terminating in the inferior aspect of the right atrium (approximately 6.5 cm below the superior cavoatrial junction). The lung volumes are low.  There are bibasilar opacities (left greater than right), which may represent areas of atelectasis and/or consolidation.  Small bilateral pleural effusions (left greater than right).  Pulmonary vasculature is within normal limits.  Heart size is mildly enlarged (unchanged). The patient is rotated to the right on today's exam, resulting in distortion of the mediastinal contours and reduced diagnostic sensitivity and specificity for mediastinal pathology.  Atherosclerosis in the thoracic aorta.  IMPRESSION: 1.  Tip of right IJ central venous catheter is in the inferior aspect of the right atrium and could be retracted approximately 6.5 cm for more optimal placement with tip at the superior cavoatrial junction. 2.  Decreasing lung volumes with increasing bibasilar areas of atelectasis and/or consolidation, with increasing small bilateral pleural effusions. 3.  Mild cardiomegaly. 4.  Atherosclerosis.  Original Report Authenticated By: Florencia Reasons, M.D.   Dg Chest Portable 1 View  09/28/2011  *RADIOLOGY REPORT*  Clinical Data: Right central line placement.  PORTABLE CHEST - 1 VIEW  Comparison: None.  Findings: The right central venous catheter placed with tip over the low right atrium.  If positioning as above the SVC / RA junction is desired, the catheter could be withdrawn about 7.7 cm. No pneumothorax.  Shallow inspiration.  Mild cardiac enlargement with normal pulmonary vascularity.  Interstitial changes suggesting fibrosis.  Calcified and tortuous aorta.  No blunting of costophrenic angles. Mediastinal contours appear intact.  IMPRESSION: Right central venous catheter placed with tip over the low right atrium, about 7.7 cm below the SVC / RA junction.  No pneumothorax.  Original Report Authenticated By: Marlon Pel, M.D.   Dg Abd Acute W/chest  09/27/2011  *RADIOLOGY REPORT*  Clinical Data: Abdominal pain, shortness of breath  ACUTE ABDOMEN SERIES (ABDOMEN 2 VIEW & CHEST 1 VIEW)  Comparison: None.  Findings: Mild cardiomegaly with vascular congestion and chronic interstitial  changes.  Negative for CHF or pneumonia.  No effusion or pneumothorax.  no definite free air.  Nonobstructive bowel gas pattern.  There is stool distally in the sigmoid region.  Mild nonspecific gaseous distention in the right abdomen.  Limited exam because of positioning.  Degenerative changes of the spine and hips.  Pelvic calcifications consistent with venous phleboliths. Calcification over the right iliac crest noted, nonspecific this could be an injection granuloma versus an appendicolith.  IMPRESSION: Cardiomegaly without CHF or pneumonia  Negative for obstruction or free air.  Original Report Authenticated By: Judie Petit. Ruel Favors, M.D.    Scheduled Meds:   . enoxaparin (LOVENOX) injection  1 mg/kg Subcutaneous Q12H  . levothyroxine  88 mcg Oral QAC breakfast  . metoprolol tartrate  25 mg Oral BID  . pantoprazole  40 mg Oral Q1200  . piperacillin-tazobactam (ZOSYN)  IV  3.375 g Intravenous Q8H  . pneumococcal 23 valent vaccine  0.5 mL Intramuscular Tomorrow-1000  . potassium chloride  40 mEq Oral Once  . sodium chloride  10-40 mL Intracatheter Q12H  . warfarin  5 mg Oral ONCE-1800  . warfarin  5 mg Oral ONCE-1800  . Warfarin - Pharmacist Dosing Inpatient   Does not apply q1800  . DISCONTD: levothyroxine  44 mcg Intravenous Daily  . DISCONTD: metoprolol  2.5 mg Intravenous Q6H  . DISCONTD: pantoprazole (PROTONIX) IV  40 mg Intravenous QHS    Continuous Infusions:   . sodium chloride 10 mL/hr at 09/30/11 1358  . DISCONTD: diltiazem (CARDIZEM) infusion Stopped (09/29/11 0800)    Lambert Keto, MD  [facility] Pager [phone]  If 7PM-7AM, please contact night-coverage [email or web address] Password [facility] 10/01/2011, 9:57 AM

## 2011-10-02 LAB — CULTURE, BLOOD (ROUTINE X 2)

## 2011-10-02 LAB — PROTIME-INR
INR: 3.1 — ABNORMAL HIGH (ref 0.00–1.49)
Prothrombin Time: 32.4 seconds — ABNORMAL HIGH (ref 11.6–15.2)

## 2011-10-02 MED ORDER — POTASSIUM CHLORIDE CRYS ER 20 MEQ PO TBCR
40.0000 meq | EXTENDED_RELEASE_TABLET | Freq: Two times a day (BID) | ORAL | Status: AC
  Administered 2011-10-02 (×2): 40 meq via ORAL
  Filled 2011-10-02 (×2): qty 2

## 2011-10-02 MED ORDER — CIPROFLOXACIN HCL 500 MG PO TABS
500.0000 mg | ORAL_TABLET | Freq: Two times a day (BID) | ORAL | Status: DC
  Administered 2011-10-02 – 2011-10-04 (×5): 500 mg via ORAL
  Filled 2011-10-02 (×8): qty 1

## 2011-10-02 MED ORDER — METRONIDAZOLE 500 MG PO TABS
500.0000 mg | ORAL_TABLET | Freq: Three times a day (TID) | ORAL | Status: DC
  Administered 2011-10-02 – 2011-10-04 (×8): 500 mg via ORAL
  Filled 2011-10-02 (×10): qty 1

## 2011-10-02 NOTE — Progress Notes (Signed)
[facility] PROGRESS NOTE  MORENIKE CUFF ZOX:[id number] DOB: [birth year] DOA: 09/27/2011   Assessment/Plan: Hypotension (09/28/2011)/SIRS (systemic inflammatory response syndrome) (09/28/2011) -2/2 volume depletion and Bp Meds., now resolved. -replete hypokalemia.  Dehydration (09/28/2011) -resolved with IV fluids.  Abdominal pain (09/28/2011) -2/2 to colitis, discontinue zosyn. c.dif negative -appreciate Surgery assistance. -change antibiotics to cipro and flagyl.  Atrial fibrillation (09/28/2011)/Coagulopathy (09/28/2011 -rate controlled on metoprolol. -INR suprapeutic  Encephalopathy acute (09/28/2011) -resolved. -resume namenda -Haldol PRN  Acidosis (09/28/2011)  -resolved, 2/2 to hypotention  Code Status: full  Family Communication:  Disposition Plan: Son [id number]    LOS: 5 days   Procedures: CT abdomen: New acute colitis involving the descending colon with pericolonic inflammation and a small amount of ascites  Antibiotics:  Zosyn  cipro and flagl 10/02/2011  Interim History: 78 yo with sudden onset emesis, diarrhea, lower abdominal pain and hypotension who initially presented to [facility] and was transferred to [facility] for further management by PCCM. CT done showed colitis, lactic acid high required no pressor, was hypotensive due to vol. depletion and SIRS. surgery consulted.  Subjective: Patient relates she feels much improved still having diarrhea but has more energy and able to get up and walk.  Objective: Filed Vitals:   10/01/11 0357 10/01/11 0456 10/01/11 2133 10/02/11 0421  BP:  139/84 131/83 146/82  Pulse:  94 90 88  Temp:  98 F (36.7 C) 98.8 F (37.1 C) 98.1 F (36.7 C)  TempSrc:  Oral Oral Oral  Resp:  20 20 18   Height:      Weight: 74.7 kg (164 lb 10.9 oz)   71.9 kg (158 lb 8.2 oz)  SpO2:  93% 95% 95%    Intake/Output Summary (Last 24 hours) at 10/02/11 0858 Last data filed at 10/02/11 0501  Gross per 24 hour  Intake    480 ml  Output    300  ml  Net    180 ml   Weight change: -2.8 kg (-6 lb 2.8 oz)  Exam:  General: Alert, awake, oriented x3, in no acute distress.  HEENT: No bruits, no goiter.  Heart: Regular rate and rhythm, without murmurs, rubs, gallops.  Lungs: Good air movement, bilateral air movement.  Abdomen: Soft, nontender, nondistended, positive bowel sounds.  Neuro: Grossly intact, nonfocal.   Data Reviewed: Basic Metabolic Panel:  Lab 10/01/11 5621 09/29/11 0500 09/28/11 2153 09/28/11 0415 09/27/11 2146  NA 139 140 138 141 138  K 3.3* 3.1* -- -- --  CL 103 105 104 106 104  CO2 28 27 24 20  17*  GLUCOSE 88 86 99 214* 232*  BUN 13 19 19 19 18   CREATININE 0.65 0.70 0.76 0.83 0.92  CALCIUM 9.0 8.2* 8.5 9.1 10.0  MG -- -- -- -- --  PHOS -- -- -- -- --   Liver Function Tests:  Lab 09/27/11 2146  AST 23  ALT 15  ALKPHOS 99  BILITOT 0.4  PROT 5.6*  ALBUMIN 3.3*    Lab 09/28/11 0415 09/27/11 2146  LIPASE -- 45  AMYLASE 73 --   No results found for this basename: AMMONIA:5 in the last 168 hours CBC:  Lab 10/01/11 0605 09/29/11 0500 09/28/11 0800 09/27/11 2146  WBC 7.4 12.3* 15.2* 14.9*  NEUTROABS -- -- 14.1* 12.6*  HGB 12.7 11.9* 13.4 12.9  HCT 37.1 35.1* 38.9 37.7  MCV 90.7 91.9 91.3 92.0  PLT 173 148* 176 179   Cardiac Enzymes:  Lab 09/27/11 2146  CKTOTAL --  CKMB --  CKMBINDEX --  TROPONINI <0.30   BNP: No components found with this basename: POCBNP:5 CBG:  Lab 09/29/11 0115 09/28/11 1946 09/28/11 0137  GLUCAP 92 129* 184*    Recent Results (from the past 240 hour(s))  CULTURE, BLOOD (ROUTINE X 2)     Status: Normal (Preliminary result)   Collection Time   09/27/11 10:30 PM      Component Value Range Status Comment   Specimen Description BLOOD RIGHT ARM DRAWN BY RN   Final    Special Requests BOTTLES DRAWN AEROBIC AND ANAEROBIC 6CC   Final    Culture NO GROWTH 4 DAYS   Final    Report Status PENDING   Incomplete   CULTURE, BLOOD (ROUTINE X 2)     Status: Normal  (Preliminary result)   Collection Time   09/27/11 10:30 PM      Component Value Range Status Comment   Specimen Description BLOOD LEFT HAND   Final    Special Requests BOTTLES DRAWN AEROBIC AND ANAEROBIC 6CC   Final    Culture NO GROWTH 4 DAYS   Final    Report Status PENDING   Incomplete   MRSA PCR SCREENING     Status: Normal   Collection Time   09/28/11  1:31 AM      Component Value Range Status Comment   MRSA by PCR NEGATIVE  NEGATIVE Final   CLOSTRIDIUM DIFFICILE BY PCR     Status: Normal   Collection Time   10/01/11 10:23 AM      Component Value Range Status Comment   C difficile by pcr NEGATIVE  NEGATIVE Final      Studies: Ct Abdomen Pelvis Wo Contrast  09/28/2011  *RADIOLOGY REPORT*  Clinical Data: Increasing abdominal pain in the left lower quadrant.  Nausea and vomiting.  Previous hysterectomy and appendectomy.  CT ABDOMEN AND PELVIS WITHOUT CONTRAST  Technique:  Multidetector CT imaging of the abdomen and pelvis was performed following the standard protocol without intravenous contrast.  Comparison: CT scan and chest and abdomen radiographs dated 09/27/2011  Findings: The patient has a new consolidative pneumonia in the left lower lobe with a tiny effusion.  There is a new tiny right effusion with focal atelectasis in the right lower lobe. Moderate hiatal hernia.  The patient has developed acute colitis of the descending colon extending from the splenic flexure into the proximal sigmoid region.  This is new since the prior study.  There is diffuse pericolonic inflammation without an abscess.  There is a small amount of new ascites in the pelvis and in the left pericolic gutter.  The liver, spleen, pancreas, left adrenal gland, and kidneys are normal.  There is a 14 mm low density lesion in the right adrenal gland consistent with benign adenoma.  Lipoma in the duodenum is again noted.  Cyst is noted on the right ovary, slightly smaller than on the prior study.  Left ovary is normal.   Uterus has been removed. Foley catheter in place.  No visible diverticula in the colon.  IMPRESSION: New acute colitis involving the descending colon with pericolonic inflammation and a small amount of ascites.  New left lower lobe pneumonia.  New tiny bilateral pleural effusions and right base atelectasis.  Original Report Authenticated By: Gwynn Burly, M.D.   Ct Abdomen Pelvis Wo Contrast  09/27/2011  *RADIOLOGY REPORT*  Clinical Data: Severe abdominal pain.  Low blood pressure.  CT ABDOMEN AND PELVIS WITHOUT CONTRAST  Technique:  Multidetector CT imaging of  the abdomen and pelvis was performed following the standard protocol without intravenous contrast.  Comparison: None.  Findings: Minimal bilateral pleural effusions with atelectasis or infiltration in the lung bases.  Cardiac enlargement.  Coronary artery calcification.  Small esophageal hiatal hernia.  The unenhanced appearance of the liver, spleen, gallbladder, left adrenal gland, kidneys, and retroperitoneal lymph nodes is unremarkable.  There is a hypodense nodule in the right adrenal gland measuring 1.8 cm diameter consistent with an adenoma. Calcification of the abdominal aorta without aneurysm.  No retroperitoneal hematomas.  Diffusely stool filled colon without significant distension or wall thickening. 6 mm fat density lesion in the second portion of the duodenum consistent with lipoma.  The stomach and small bowel are not abnormally distended.  No free air or free fluid in the abdomen.  Prominent visceral adipose tissues.  Pelvis:  A the uterus appears to be surgically absent.  There is a right-sided pelvic mass measuring 3.2 x 4.2 cm, likely representing the right ovary.  This is a moderately enlarged for age and ultrasound should be considered for followup.  The bladder is decompressed with Foley catheter.  No free air or free fluid in the abdomen.  No significant pelvic lymphadenopathy.  The appendix is not visualized.  Degenerative changes  in the lumbar spine.  IMPRESSION:  Small esophageal hiatal hernia.  Minimal bilateral pleural effusions with basilar atelectasis or infiltration.  Right adrenal gland nodule.  Prominence of the right ovary.  Consider ultrasound for follow-up.  Diffusely stool filled colon without bowel obstruction.  Duodenal lipoma.  Original Report Authenticated By: Marlon Pel, M.D.   Dg Chest Port 1 View  09/30/2011  *RADIOLOGY REPORT*  Clinical Data: Follow-up evaluation of left lower lobe atelectasis or infiltrate.  PORTABLE CHEST - 1 VIEW  Comparison: 09/28/2011.  Findings: There is a right-sided internal jugular central venous catheter with tip terminating in the inferior aspect of the right atrium (approximately 6.5 cm below the superior cavoatrial junction). The lung volumes are low.  There are bibasilar opacities (left greater than right), which may represent areas of atelectasis and/or consolidation.  Small bilateral pleural effusions (left greater than right).  Pulmonary vasculature is within normal limits.  Heart size is mildly enlarged (unchanged). The patient is rotated to the right on today's exam, resulting in distortion of the mediastinal contours and reduced diagnostic sensitivity and specificity for mediastinal pathology.  Atherosclerosis in the thoracic aorta.  IMPRESSION: 1.  Tip of right IJ central venous catheter is in the inferior aspect of the right atrium and could be retracted approximately 6.5 cm for more optimal placement with tip at the superior cavoatrial junction. 2.  Decreasing lung volumes with increasing bibasilar areas of atelectasis and/or consolidation, with increasing small bilateral pleural effusions. 3.  Mild cardiomegaly. 4.  Atherosclerosis.  Original Report Authenticated By: Florencia Reasons, M.D.   Dg Chest Portable 1 View  09/28/2011  *RADIOLOGY REPORT*  Clinical Data: Right central line placement.  PORTABLE CHEST - 1 VIEW  Comparison: None.  Findings: The right central  venous catheter placed with tip over the low right atrium.  If positioning as above the SVC / RA junction is desired, the catheter could be withdrawn about 7.7 cm. No pneumothorax.  Shallow inspiration.  Mild cardiac enlargement with normal pulmonary vascularity.  Interstitial changes suggesting fibrosis. Calcified and tortuous aorta.  No blunting of costophrenic angles. Mediastinal contours appear intact.  IMPRESSION: Right central venous catheter placed with tip over the low right atrium, about 7.7  cm below the SVC / RA junction.  No pneumothorax.  Original Report Authenticated By: Marlon Pel, M.D.   Dg Abd Acute W/chest  09/27/2011  *RADIOLOGY REPORT*  Clinical Data: Abdominal pain, shortness of breath  ACUTE ABDOMEN SERIES (ABDOMEN 2 VIEW & CHEST 1 VIEW)  Comparison: None.  Findings: Mild cardiomegaly with vascular congestion and chronic interstitial changes.  Negative for CHF or pneumonia.  No effusion or pneumothorax.  no definite free air.  Nonobstructive bowel gas pattern.  There is stool distally in the sigmoid region.  Mild nonspecific gaseous distention in the right abdomen.  Limited exam because of positioning.  Degenerative changes of the spine and hips.  Pelvic calcifications consistent with venous phleboliths. Calcification over the right iliac crest noted, nonspecific this could be an injection granuloma versus an appendicolith.  IMPRESSION: Cardiomegaly without CHF or pneumonia  Negative for obstruction or free air.  Original Report Authenticated By: Judie Petit. Ruel Favors, M.D.    Scheduled Meds:    . enoxaparin (LOVENOX) injection  1 mg/kg Subcutaneous Q12H  . levothyroxine  88 mcg Oral QAC breakfast  . metoprolol tartrate  25 mg Oral BID  . pantoprazole  40 mg Oral Q1200  . piperacillin-tazobactam (ZOSYN)  IV  3.375 g Intravenous Q8H  . pneumococcal 23 valent vaccine  0.5 mL Intramuscular Tomorrow-1000  . sodium chloride  10-40 mL Intracatheter Q12H  . warfarin  5 mg Oral  ONCE-1800  . Warfarin - Pharmacist Dosing Inpatient   Does not apply q1800   Continuous Infusions:    . sodium chloride 10 mL/hr at 09/30/11 1358    Lambert Keto, MD  [facility] Pager [phone]  If 7PM-7AM, please contact night-coverage [email or web address] Password [facility] 10/02/2011, 8:58 AM

## 2011-10-02 NOTE — Progress Notes (Signed)
ANTICOAGULATION CONSULT NOTE - Follow Up Consult  Pharmacy Consult for Lovenox/Warfarin Indication: atrial fibrillation  No Known Allergies  Patient Measurements: Height: 5\' 10"  (177.8 cm) Weight: 158 lb 8.2 oz (71.9 kg) IBW/kg (Calculated) : 68.5   Vital Signs: Temp: 98.1 F (36.7 C) (08/11 0421) Temp src: Oral (08/11 0421) BP: 146/82 mmHg (08/11 0421) Pulse Rate: 88  (08/11 0421)  Labs:  Basename 10/02/11 0435 10/01/11 0605 09/30/11 0410  HGB -- 12.7 --  HCT -- 37.1 --  PLT -- 173 --  APTT -- -- --  LABPROT 32.4* 23.4* 20.8*  INR 3.10* 2.04* 1.76*  HEPARINUNFRC -- -- --  CREATININE -- 0.65 --  CKTOTAL -- -- --  CKMB -- -- --  TROPONINI -- -- --    Estimated Creatinine Clearance: 56.6 ml/min (by C-G formula based on Cr of 0.65).   Medications:  Patient takes home warfarin 5mg  every day except 2.5mg  on Tuesdays.    Assessment: Anticoagulation: Patient is a 78 year old female with a history of atrial fibrillation on chronic warfarin prior to admission.  Warfarin restarted 8/9 and bridged with Lovenox. INR today is 3.10. Hg 12.7 on 8/10  Goal of Therapy:  INR 2-3 Monitor platelets by anticoagulation protocol: Yes   Plan:  1) Hold Coumadin tonight  2) D/C'd Lovenox 3) Monitor INR daily    Mennie Spiller, Arty Baumgartner 10/02/2011,9:47 AM

## 2011-10-03 DIAGNOSIS — R5381 Other malaise: Secondary | ICD-10-CM

## 2011-10-03 DIAGNOSIS — E860 Dehydration: Secondary | ICD-10-CM

## 2011-10-03 MED ORDER — POTASSIUM CHLORIDE CRYS ER 20 MEQ PO TBCR: 40.0000 meq | EXTENDED_RELEASE_TABLET | Freq: Every day | ORAL | Status: DC

## 2011-10-03 MED ORDER — FAMOTIDINE 40 MG PO TABS
40.0000 mg | ORAL_TABLET | Freq: Every day | ORAL | Status: DC
  Administered 2011-10-03 – 2011-10-04 (×2): 40 mg via ORAL
  Filled 2011-10-03 (×2): qty 1

## 2011-10-03 MED ORDER — POTASSIUM CHLORIDE CRYS ER 20 MEQ PO TBCR
40.0000 meq | EXTENDED_RELEASE_TABLET | Freq: Every day | ORAL | Status: DC
  Administered 2011-10-03 – 2011-10-04 (×2): 40 meq via ORAL
  Filled 2011-10-03 (×2): qty 2

## 2011-10-03 MED ORDER — POTASSIUM CHLORIDE CRYS ER 20 MEQ PO TBCR: 40.0000 meq | EXTENDED_RELEASE_TABLET | Freq: Two times a day (BID) | ORAL | Status: DC

## 2011-10-03 MED ORDER — SACCHAROMYCES BOULARDII 250 MG PO CAPS
250.0000 mg | ORAL_CAPSULE | Freq: Two times a day (BID) | ORAL | Status: DC
  Administered 2011-10-03 – 2011-10-04 (×3): 250 mg via ORAL
  Filled 2011-10-03 (×4): qty 1

## 2011-10-03 MED ORDER — LOPERAMIDE HCL 2 MG PO CAPS
2.0000 mg | ORAL_CAPSULE | Freq: Three times a day (TID) | ORAL | Status: DC | PRN
  Administered 2011-10-03: 2 mg via ORAL
  Filled 2011-10-03: qty 1

## 2011-10-03 NOTE — Progress Notes (Signed)
[facility] PROGRESS NOTE  Tamara [name] WGN:[id number] DOB: 23-Aug-1924 DOA: 09/27/2011   Assessment/Plan: Hypotension (09/28/2011)/SIRS (systemic inflammatory response syndrome) (09/28/2011) -2/2 volume depletion and Bp Meds., now resolved. -replete hypokalemia.  Diarrhea: -Continue to have diarrhea C. difficile was negative and start her on Imodium. -Avoid lactose products.  Dehydration (09/28/2011) -resolved with IV fluids.  Abdominal pain (09/28/2011) -2/2 to colitis, discontinue zosyn. c.dif negative -appreciate Surgery assistance. -Oral cipro and flagyl.  Atrial fibrillation (09/28/2011)/Coagulopathy (09/28/2011 -rate controlled on metoprolol. -INR suprapeutic  Encephalopathy acute (09/28/2011) -resolved. -resume namenda -Haldol PRN  Acidosis (09/28/2011)  -resolved, 2/2 to hypotention  Code Status: full  Family Communication:  Disposition Plan: Son [id number]    LOS: 6 days   Procedures: CT abdomen: New acute colitis involving the descending colon with pericolonic inflammation and a small amount of ascites  Antibiotics:  Zosyn  cipro and flagl 10/02/2011  Interim History: 78 yo with sudden onset emesis, diarrhea, lower abdominal pain and hypotension who initially presented to [facility] and was transferred to [facility] for further management by PCCM. CT done showed colitis, lactic acid high required no pressor, was hypotensive due to vol. depletion and SIRS. surgery consulted.  Subjective: Patient relates she feels much improved still having diarrhea but has more energy and able to get up and walk.  Objective: Filed Vitals:   10/02/11 1500 10/02/11 2057 10/03/11 0512 10/03/11 1031  BP: 114/76 115/74 147/88 140/83  Pulse: 75 98 66 95  Temp: 98.6 F (37 C) 98 F (36.7 C) 98.3 F (36.8 C)   TempSrc:  Oral Oral   Resp: 18 18 18    Height:      Weight:   71.7 kg (158 lb 1.1 oz)   SpO2: 97% 96% 97%     Intake/Output Summary (Last 24 hours) at 10/03/11 1043 Last  data filed at 10/03/11 0942  Gross per 24 hour  Intake    240 ml  Output    600 ml  Net   -360 ml   Weight change: -0.2 kg (-7.1 oz)  Exam:  General: Alert, awake, oriented x3, in no acute distress.  HEENT: No bruits, no goiter.  Heart: Regular rate and rhythm, without murmurs, rubs, gallops.  Lungs: Good air movement, bilateral air movement.  Abdomen: Soft, nontender, nondistended, positive bowel sounds.  Neuro: Grossly intact, nonfocal.   Data Reviewed: Basic Metabolic Panel:  Lab 10/01/11 8413 09/29/11 0500 09/28/11 2153 09/28/11 0415 09/27/11 2146  NA 139 140 138 141 138  K 3.3* 3.1* -- -- --  CL 103 105 104 106 104  CO2 28 27 24 20  17*  GLUCOSE 88 86 99 214* 232*  BUN 13 19 19 19 18   CREATININE 0.65 0.70 0.76 0.83 0.92  CALCIUM 9.0 8.2* 8.5 9.1 10.0  MG -- -- -- -- --  PHOS -- -- -- -- --   Liver Function Tests:  Lab 09/27/11 2146  AST 23  ALT 15  ALKPHOS 99  BILITOT 0.4  PROT 5.6*  ALBUMIN 3.3*    Lab 09/28/11 0415 09/27/11 2146  LIPASE -- 45  AMYLASE 73 --   No results found for this basename: AMMONIA:5 in the last 168 hours CBC:  Lab 10/01/11 0605 09/29/11 0500 09/28/11 0800 09/27/11 2146  WBC 7.4 12.3* 15.2* 14.9*  NEUTROABS -- -- 14.1* 12.6*  HGB 12.7 11.9* 13.4 12.9  HCT 37.1 35.1* 38.9 37.7  MCV 90.7 91.9 91.3 92.0  PLT 173 148* 176 179   Cardiac Enzymes:  Lab  09/27/11 2146  CKTOTAL --  CKMB --  CKMBINDEX --  TROPONINI <0.30   BNP: No components found with this basename: POCBNP:5 CBG:  Lab 09/29/11 0115 09/28/11 1946 09/28/11 0137  GLUCAP 92 129* 184*    Recent Results (from the past 240 hour(s))  CULTURE, BLOOD (ROUTINE X 2)     Status: Normal   Collection Time   09/27/11 10:30 PM      Component Value Range Status Comment   Specimen Description BLOOD RIGHT ARM DRAWN BY RN   Final    Special Requests BOTTLES DRAWN AEROBIC AND ANAEROBIC 6CC   Final    Culture NO GROWTH 5 DAYS   Final    Report Status 10/02/2011 FINAL   Final    CULTURE, BLOOD (ROUTINE X 2)     Status: Normal   Collection Time   09/27/11 10:30 PM      Component Value Range Status Comment   Specimen Description BLOOD LEFT HAND   Final    Special Requests BOTTLES DRAWN AEROBIC AND ANAEROBIC 6CC   Final    Culture NO GROWTH 5 DAYS   Final    Report Status 10/02/2011 FINAL   Final   MRSA PCR SCREENING     Status: Normal   Collection Time   09/28/11  1:31 AM      Component Value Range Status Comment   MRSA by PCR NEGATIVE  NEGATIVE Final   CLOSTRIDIUM DIFFICILE BY PCR     Status: Normal   Collection Time   10/01/11 10:23 AM      Component Value Range Status Comment   C difficile by pcr NEGATIVE  NEGATIVE Final      Studies: Ct Abdomen Pelvis Wo Contrast  09/28/2011  *RADIOLOGY REPORT*  Clinical Data: Increasing abdominal pain in the left lower quadrant.  Nausea and vomiting.  Previous hysterectomy and appendectomy.  CT ABDOMEN AND PELVIS WITHOUT CONTRAST  Technique:  Multidetector CT imaging of the abdomen and pelvis was performed following the standard protocol without intravenous contrast.  Comparison: CT scan and chest and abdomen radiographs dated 09/27/2011  Findings: The patient has a new consolidative pneumonia in the left lower lobe with a tiny effusion.  There is a new tiny right effusion with focal atelectasis in the right lower lobe. Moderate hiatal hernia.  The patient has developed acute colitis of the descending colon extending from the splenic flexure into the proximal sigmoid region.  This is new since the prior study.  There is diffuse pericolonic inflammation without an abscess.  There is a small amount of new ascites in the pelvis and in the left pericolic gutter.  The liver, spleen, pancreas, left adrenal gland, and kidneys are normal.  There is a 14 mm low density lesion in the right adrenal gland consistent with benign adenoma.  Lipoma in the duodenum is again noted.  Cyst is noted on the right ovary, slightly smaller than on the prior  study.  Left ovary is normal.  Uterus has been removed. Foley catheter in place.  No visible diverticula in the colon.  IMPRESSION: New acute colitis involving the descending colon with pericolonic inflammation and a small amount of ascites.  New left lower lobe pneumonia.  New tiny bilateral pleural effusions and right base atelectasis.  Original Report Authenticated By: Gwynn Burly, M.D.   Ct Abdomen Pelvis Wo Contrast  09/27/2011  *RADIOLOGY REPORT*  Clinical Data: Severe abdominal pain.  Low blood pressure.  CT ABDOMEN AND PELVIS WITHOUT CONTRAST  Technique:  Multidetector CT imaging of the abdomen and pelvis was performed following the standard protocol without intravenous contrast.  Comparison: None.  Findings: Minimal bilateral pleural effusions with atelectasis or infiltration in the lung bases.  Cardiac enlargement.  Coronary artery calcification.  Small esophageal hiatal hernia.  The unenhanced appearance of the liver, spleen, gallbladder, left adrenal gland, kidneys, and retroperitoneal lymph nodes is unremarkable.  There is a hypodense nodule in the right adrenal gland measuring 1.8 cm diameter consistent with an adenoma. Calcification of the abdominal aorta without aneurysm.  No retroperitoneal hematomas.  Diffusely stool filled colon without significant distension or wall thickening. 6 mm fat density lesion in the second portion of the duodenum consistent with lipoma.  The stomach and small bowel are not abnormally distended.  No free air or free fluid in the abdomen.  Prominent visceral adipose tissues.  Pelvis:  A the uterus appears to be surgically absent.  There is a right-sided pelvic mass measuring 3.2 x 4.2 cm, likely representing the right ovary.  This is a moderately enlarged for age and ultrasound should be considered for followup.  The bladder is decompressed with Foley catheter.  No free air or free fluid in the abdomen.  No significant pelvic lymphadenopathy.  The appendix is not  visualized.  Degenerative changes in the lumbar spine.  IMPRESSION:  Small esophageal hiatal hernia.  Minimal bilateral pleural effusions with basilar atelectasis or infiltration.  Right adrenal gland nodule.  Prominence of the right ovary.  Consider ultrasound for follow-up.  Diffusely stool filled colon without bowel obstruction.  Duodenal lipoma.  Original Report Authenticated By: Marlon Pel, M.D.   Dg Chest Port 1 View  09/30/2011  *RADIOLOGY REPORT*  Clinical Data: Follow-up evaluation of left lower lobe atelectasis or infiltrate.  PORTABLE CHEST - 1 VIEW  Comparison: 09/28/2011.  Findings: There is a right-sided internal jugular central venous catheter with tip terminating in the inferior aspect of the right atrium (approximately 6.5 cm below the superior cavoatrial junction). The lung volumes are low.  There are bibasilar opacities (left greater than right), which may represent areas of atelectasis and/or consolidation.  Small bilateral pleural effusions (left greater than right).  Pulmonary vasculature is within normal limits.  Heart size is mildly enlarged (unchanged). The patient is rotated to the right on today's exam, resulting in distortion of the mediastinal contours and reduced diagnostic sensitivity and specificity for mediastinal pathology.  Atherosclerosis in the thoracic aorta.  IMPRESSION: 1.  Tip of right IJ central venous catheter is in the inferior aspect of the right atrium and could be retracted approximately 6.5 cm for more optimal placement with tip at the superior cavoatrial junction. 2.  Decreasing lung volumes with increasing bibasilar areas of atelectasis and/or consolidation, with increasing small bilateral pleural effusions. 3.  Mild cardiomegaly. 4.  Atherosclerosis.  Original Report Authenticated By: Florencia Reasons, M.D.   Dg Chest Portable 1 View  09/28/2011  *RADIOLOGY REPORT*  Clinical Data: Right central line placement.  PORTABLE CHEST - 1 VIEW  Comparison:  None.  Findings: The right central venous catheter placed with tip over the low right atrium.  If positioning as above the SVC / RA junction is desired, the catheter could be withdrawn about 7.7 cm. No pneumothorax.  Shallow inspiration.  Mild cardiac enlargement with normal pulmonary vascularity.  Interstitial changes suggesting fibrosis. Calcified and tortuous aorta.  No blunting of costophrenic angles. Mediastinal contours appear intact.  IMPRESSION: Right central venous catheter placed with tip over  the low right atrium, about 7.7 cm below the SVC / RA junction.  No pneumothorax.  Original Report Authenticated By: Marlon Pel, M.D.   Dg Abd Acute W/chest  09/27/2011  *RADIOLOGY REPORT*  Clinical Data: Abdominal pain, shortness of breath  ACUTE ABDOMEN SERIES (ABDOMEN 2 VIEW & CHEST 1 VIEW)  Comparison: None.  Findings: Mild cardiomegaly with vascular congestion and chronic interstitial changes.  Negative for CHF or pneumonia.  No effusion or pneumothorax.  no definite free air.  Nonobstructive bowel gas pattern.  There is stool distally in the sigmoid region.  Mild nonspecific gaseous distention in the right abdomen.  Limited exam because of positioning.  Degenerative changes of the spine and hips.  Pelvic calcifications consistent with venous phleboliths. Calcification over the right iliac crest noted, nonspecific this could be an injection granuloma versus an appendicolith.  IMPRESSION: Cardiomegaly without CHF or pneumonia  Negative for obstruction or free air.  Original Report Authenticated By: Judie Petit. Ruel Favors, M.D.    Scheduled Meds:    . ciprofloxacin  500 mg Oral BID  . levothyroxine  88 mcg Oral QAC breakfast  . metoprolol tartrate  25 mg Oral BID  . metroNIDAZOLE  500 mg Oral Q8H  . pantoprazole  40 mg Oral Q1200  . potassium chloride  40 mEq Oral BID  . potassium chloride  40 mEq Oral BID  . sodium chloride  10-40 mL Intracatheter Q12H  . Warfarin - Pharmacist Dosing Inpatient    Does not apply q1800   Continuous Infusions:    . sodium chloride 10 mL/hr at 09/30/11 1358    Lambert Keto, MD  [facility] Pager [phone]  If 7PM-7AM, please contact night-coverage [email or web address] Password [facility] 10/03/2011, 10:43 AM

## 2011-10-03 NOTE — Consult Note (Signed)
Physical Medicine and Rehabilitation Consult Reason for Consult: Deconditioning/hypotension Referring Physician: [facility]   HPI: Tamara [name] is a 78 y.o. right-handed female with history of age of fibrillation on chronic Coumadin therapy . Patient lived alone prior to admission and was driving . Admitted 09/28/2011 with sudden onset of emesis, diarrhea and lower abdominal pain with hypotension. Patient initially presented to [facility] and transferred to [facility] for further management. Noted hypokalemia 2.6 with supplement added. CT of abdomen and pelvis showed acute colitis involving the descending colon with pericolonic inflammation and a small amount of ascites. There is also noted a left lower lobe pneumonia. C. difficile specimens were negative and currently maintained on Cipro as well as Flagyl empirically. Bouts of confusion patient also on Namenda prior to admission with question of acute encephalopathy. Hypokalemia continues to improve with latest potassium level 3.3. She remains on Coumadin therapy with latest INR 3.44. Her blood pressures had normalized she does continue on low-dose beta blocker. Physical therapy ongoing with deconditioning recommendations of physical medicine rehabilitation consult to consider inpatient rehabilitation services.   Review of Systems  Cardiovascular: Positive for palpitations.  Gastrointestinal: Positive for nausea, vomiting and diarrhea.  All other systems reviewed and are negative.   Past Medical History  Diagnosis Date  . Hypertension   . Atrial fibrillation   . Osteoporosis   . Arthritis    Past Surgical History  Procedure Date  . Abdominal hysterectomy   . Appendectomy    History reviewed. No pertinent family history. Social History:  reports that she has never smoked. She does not have any smokeless tobacco history on file. She reports that she does not drink alcohol. Her drug history not on file. Allergies: No Known Allergies Medications  Prior to Admission  Medication Sig Dispense Refill  . acetaminophen (ARTHRITIS PAIN RELIEF) 650 MG CR tablet Take 650 mg by mouth daily as needed. For arthritis      . alendronate (FOSAMAX) 70 MG tablet Take 70 mg by mouth every 7 (seven) days. Take with a full glass of water on an empty stomach for the bones      . cetirizine (ZYRTEC) 10 MG tablet Take 10 mg by mouth daily. For allergies      . Cholecalciferol (VITAMIN D PO) Take 1 tablet by mouth daily.      Marland Kitchen levothyroxine (SYNTHROID, LEVOTHROID) 88 MCG tablet Take 88 mcg by mouth daily. For thyroid      . lisinopril-hydrochlorothiazide (PRINZIDE,ZESTORETIC) 20-25 MG per tablet Take 1 tablet by mouth daily.      . memantine (NAMENDA) 10 MG tablet Take 10 mg by mouth 2 (two) times daily.      Marland Kitchen omeprazole (PRILOSEC) 40 MG capsule Take 40 mg by mouth daily. For heartburn      . polyethylene glycol powder (GLYCOLAX/MIRALAX) powder Take 17 g by mouth daily. For constipation      . potassium chloride SA (K-DUR,KLOR-CON) 20 MEQ tablet Take 20 mEq by mouth daily.      . simvastatin (ZOCOR) 40 MG tablet Take 40 mg by mouth at bedtime. For cholesterol      . warfarin (COUMADIN) 5 MG tablet Take 2.5-5 mg by mouth daily. Take 5 MG every day except Tuesdays; Take 2.5 MG on Tuesdays.        Home: Home Living Lives With: Alone Available Help at Discharge: Family;Available 24 hours/day (If needed.  ) Type of Home: House Home Access: Level entry Home Layout: One level Bathroom Shower/Tub: Tub only (  Walk-in tub with door.  ) Bathroom Toilet: Handicapped height Home Adaptive Equipment: Grab bars around toilet;Walker - rolling;Straight cane  Functional History: Prior Function Able to Take Stairs?: Yes Driving: Yes Vocation: Retired Functional Status:  Mobility: Bed Mobility Bed Mobility: Supine to Sit;Sitting - Scoot to Edge of Bed Supine to Sit: 4: Min assist;HOB elevated;With rails Sitting - Scoot to Edge of Bed: 3: Mod  assist Transfers Transfers: Sit to Stand;Stand to Sit;Stand Pivot Transfers Sit to Stand: 3: Mod assist;With upper extremity assist;From bed Stand to Sit: 4: Min assist;With upper extremity assist;To bed Stand Pivot Transfers: 4: Min assist Ambulation/Gait Ambulation/Gait Assistance: Not tested (comment) Stairs: No Wheelchair Mobility Wheelchair Mobility: No  ADL:    Cognition: Cognition Arousal/Alertness: Awake/alert Orientation Level: Oriented X4 Cognition Overall Cognitive Status: Appears within functional limits for tasks assessed/performed Arousal/Alertness: Awake/alert Orientation Level: Appears intact for tasks assessed Behavior During Session: [facility] for tasks performed  Blood pressure 140/83, pulse 95, temperature 98.3 F (36.8 C), temperature source Oral, resp. rate 18, height 5\' 10"  (1.778 m), weight 71.7 kg (158 lb 1.1 oz), SpO2 97.00%. Physical Exam  Constitutional: She appears well-developed.  HENT:  Head: Normocephalic.  Eyes:       Pupils round and reactive to light.  Neck: Normal range of motion. Neck supple. No thyromegaly present.  Cardiovascular:       Cardiac rate controlled.  Pulmonary/Chest: Breath sounds normal. She has no wheezes.  Abdominal: Bowel sounds are normal. She exhibits no distension. There is no tenderness. There is no rebound and no guarding.  Musculoskeletal: She exhibits no edema.  Neurological: She is alert.       Patient named person place situation and date of birth. She followed simple commands and named all family members in the room.  Skin: Skin is warm and dry.  Psychiatric: She has a normal mood and affect.  motor strength is 4/5 in bilateral deltoid, biceps, triceps, grip 4/5 in bilateral hip flexors 5/5 in knee extensors as well as ankle dorsiflexor and plantar flexor bilateral Sensation is intact to light-touch in upper and lower extremities Mood and affect are appropriate  Results for orders placed during the hospital  encounter of 09/27/11 (from the past 24 hour(s))  PROTIME-INR     Status: Abnormal   Collection Time   10/03/11  5:00 AM      Component Value Range   Prothrombin Time 35.2 (*) 11.6 - 15.2 seconds   INR 3.44 (*) 0.00 - 1.49   No results found.  Assessment/Plan: Diagnosis: deconditioning after colitis and left lower lobe pneumonia 1. Does the need for close, 24 hr/day medical supervision in concert with the patient's rehab needs make it unreasonable for this patient to be served in a less intensive setting? Yes 2. Co-Morbidities requiring supervision/potential complications: history of recent hypotension dehydration as well as atrial fibrillation and encephalopathy 3. Due to bladder management, bowel management, safety, skin/wound care, disease management and medication administration, does the patient require 24 hr/day rehab nursing? Yes 4. Does the patient require coordinated care of a physician, rehab nurse, PT (1-2 hrs/day, 5 days/week), OT (1-2 hrs/day, 5 days/week) and SLP (0.5-1 hrs/day, 5 days/week) to address physical and functional deficits in the context of the above medical diagnosis(es)? Yes Addressing deficits in the following areas: balance, endurance, locomotion, strength, transferring, bathing, dressing, toileting and cognition 5. Can the patient actively participate in an intensive therapy program of at least 3 hrs of therapy per day at least 5 days per week?  Yes 6. The potential for patient to make measurable gains while on inpatient rehab is excellent 7. Anticipated functional outcomes upon discharge from inpatient rehab are modified independent with PT,  mobilitymodified independent ADLs with OT, 100% orientation and cognitive abilities for independent living with SLP. 8. Estimated rehab length of stay to reach the above functional goals is: 10 days 9. Does the patient have adequate social supports to accommodate these discharge functional goals? Yes 10. Anticipated D/C  setting: Home 11. Anticipated post D/C treatments: HH therapy 12. Overall Rehab/Functional Prognosis: good  RECOMMENDATIONS: This patient's condition is appropriate for continued rehabilitative care in the following setting: CIR Patient has agreed to participate in recommended program. Yes Note that insurance prior authorization may be required for reimbursement for recommended care.  Comment:    10/03/2011

## 2011-10-03 NOTE — Progress Notes (Signed)
Physical Therapy Treatment Patient Details Name: Tamara [name] MRN: [id number] DOB: 06-11-1924 Today's Date: 10/03/2011 Time: 7829-5621 PT Time Calculation (min): 31 min  PT Assessment / Plan / Recommendation Comments on Treatment Session  Pt with colitis progressing well with mobility today able to ambulate in hall and perform partial pericare with assist for completeness. Pt encouraged to continue increasing mobility with nursing assist and to continue HEP throughout the day. Will continue to follow.    Follow Up Recommendations  Home health PT;Supervision - Intermittent    Barriers to Discharge        Equipment Recommendations  3 in 1 bedside comode;Rolling walker with 5" wheels    Recommendations for Other Services    Frequency     Plan Discharge plan needs to be updated;Frequency remains appropriate    Precautions / Restrictions Precautions Precautions: Fall   Pertinent Vitals/Pain No pain    Mobility  Bed Mobility Bed Mobility: Not assessed Transfers Transfers: Sit to Stand;Stand to Sit Sit to Stand: 5: Supervision;From chair/3-in-1 Stand to Sit: 5: Supervision;To chair/3-in-1 Details for Transfer Assistance: x 2 trials from chair and BSC with cueing for hand placement Ambulation/Gait Ambulation/Gait Assistance: 4: Min guard Ambulation Distance (Feet): 250 Feet Assistive device: Rolling walker Ambulation/Gait Assistance Details: cueing for posture and position in RW Gait Pattern: Step-through pattern;Trunk flexed;Decreased stride length Gait velocity: decreased Stairs: No    Exercises General Exercises - Lower Extremity Long Arc Quad: AROM;Both;15 reps;Seated Hip Flexion/Marching: AROM;Both;15 reps;Seated   PT Diagnosis:    PT Problem List:   PT Treatment Interventions:     PT Goals Acute Rehab PT Goals PT Goal: Sit to Stand - Progress: Progressing toward goal PT Goal: Stand to Sit - Progress: Progressing toward goal PT Goal: Ambulate - Progress:  Progressing toward goal  Visit Information  Last PT Received On: 10/03/11 Assistance Needed: +1    Subjective Data  Subjective: [place]   Cognition  Overall Cognitive Status: Appears within functional limits for tasks assessed/performed Arousal/Alertness: Awake/alert Orientation Level: Appears intact for tasks assessed Behavior During Session: [facility] [place] for tasks performed    Balance     End of Session PT - End of Session Equipment Utilized During Treatment: Gait belt Activity Tolerance: Patient tolerated treatment well Patient left: in chair;with call bell/phone within reach;with family/visitor present Nurse Communication: Mobility status   GP     [name] 10/03/2011, 2:02 PM Delaney Meigs, PT [phone]

## 2011-10-03 NOTE — Progress Notes (Signed)
ANTICOAGULATION CONSULT NOTE - Follow Up Consult  Pharmacy Consult for Warfarin Indication: atrial fibrillation  No Known Allergies  Patient Measurements: Height: 5\' 10"  (177.8 cm) Weight: 158 lb 1.1 oz (71.7 kg) IBW/kg (Calculated) : 68.5   Vital Signs: Temp: 98.3 F (36.8 C) (08/12 0512) Temp src: Oral (08/12 0512) BP: 140/83 mmHg (08/12 1031) Pulse Rate: 95  (08/12 1031)  Labs:  Basename 10/03/11 0500 10/02/11 0435 10/01/11 0605  HGB -- -- 12.7  HCT -- -- 37.1  PLT -- -- 173  APTT -- -- --  LABPROT 35.2* 32.4* 23.4*  INR 3.44* 3.10* 2.04*  HEPARINUNFRC -- -- --  CREATININE -- -- 0.65  CKTOTAL -- -- --  CKMB -- -- --  TROPONINI -- -- --    Estimated Creatinine Clearance: 56.6 ml/min (by C-G formula based on Cr of 0.65).   Medications:  Patient takes home warfarin 5mg  every day except 2.5mg  on Tuesdays.    Assessment: Patient is a 78 year old female with a history of atrial fibrillation on chronic warfarin prior to admission.  Warfarin restarted 8/9 and bridged with Lovenox. INR today is 3.44. INR trend up d/t drug interaction with flagyl. No bleeding reported.  Goal of Therapy:  INR 2-3 Monitor platelets by anticoagulation protocol: Yes   Plan:  1) Hold Coumadin tonight  2) Monitor INR daily   Verlene Mayer, PharmD, BCPS Pager [phone] 10/03/2011,1:10 PM

## 2011-10-04 DIAGNOSIS — A419 Sepsis, unspecified organism: Secondary | ICD-10-CM

## 2011-10-04 DIAGNOSIS — R6520 Severe sepsis without septic shock: Secondary | ICD-10-CM

## 2011-10-04 LAB — PROTIME-INR
INR: 2.23 — ABNORMAL HIGH (ref 0.00–1.49)
Prothrombin Time: 25.1 seconds — ABNORMAL HIGH (ref 11.6–15.2)

## 2011-10-04 MED ORDER — CIPROFLOXACIN HCL 500 MG PO TABS: 500.0000 mg | ORAL_TABLET | Freq: Two times a day (BID) | ORAL | Status: AC

## 2011-10-04 MED ORDER — LOPERAMIDE HCL 2 MG PO CAPS: 2.0000 mg | ORAL_CAPSULE | Freq: Three times a day (TID) | ORAL | Status: AC | PRN

## 2011-10-04 MED ORDER — SACCHAROMYCES BOULARDII 250 MG PO CAPS: 250.0000 mg | ORAL_CAPSULE | Freq: Two times a day (BID) | ORAL | Status: AC

## 2011-10-04 MED ORDER — METOPROLOL TARTRATE 25 MG PO TABS: 25.0000 mg | ORAL_TABLET | Freq: Two times a day (BID) | ORAL | Status: DC

## 2011-10-04 MED ORDER — METRONIDAZOLE 500 MG PO TABS: 500.0000 mg | ORAL_TABLET | Freq: Three times a day (TID) | ORAL | Status: AC

## 2011-10-04 NOTE — Progress Notes (Signed)
Patient discharge teaching given, including activity, diet, follow-up appoints, and medications. Patient verbalized understanding of all discharge instructions. IV access was d/c'd. Vitals are stable. Skin is intact except as charted in most recent assessments. Pt to be escorted out by NT, to be driven home by family. 

## 2011-10-04 NOTE — Progress Notes (Signed)
Physical Therapy Treatment Patient Details Name: Tamara [name] MRN: [id number] DOB: 1924-07-26 Today's Date: 10/04/2011 Time: 4098-1191 PT Time Calculation (min): 23 min  PT Assessment / Plan / Recommendation Comments on Treatment Session  Pt with colitis continues to make excellent progress with mobility and ambulation. Pt prefers CIR to be able to return home independently. Pt encouraged to continue ambulation with nursing throughout the day.     Follow Up Recommendations  Home health PT;Inpatient Rehab;Supervision - Intermittent    Barriers to Discharge        Equipment Recommendations       Recommendations for Other Services    Frequency     Plan Discharge plan remains appropriate;Frequency remains appropriate    Precautions / Restrictions Precautions Precautions: Fall   Pertinent Vitals/Pain No pain    Mobility  Bed Mobility Bed Mobility: Supine to Sit Supine to Sit: With rails;HOB elevated;6: Modified independent (Device/Increase time) (HOb 20degrees) Sitting - Scoot to Edge of Bed: 6: Modified independent (Device/Increase time) Details for Bed Mobility Assistance: increased time and definite need to use rail for OOB Transfers Transfers: Sit to Stand;Stand to Sit Sit to Stand: 5: Supervision;From bed Stand to Sit: 5: Supervision;To chair/3-in-1;With armrests Details for Transfer Assistance: cueing for hand placement Ambulation/Gait Ambulation/Gait Assistance: 5: Supervision Ambulation Distance (Feet): 500 Feet Assistive device: Rolling walker Ambulation/Gait Assistance Details: cueing for posture and position in RW Gait Pattern: Trunk flexed;Step-through pattern;Decreased stride length Gait velocity: decreased Stairs: Yes Stairs Assistance: 5: Supervision Stairs Assistance Details (indicate cue type and reason): supervision for lines and safety pt cued to use one rail to simulate dgtr's house but used 2 for safety and confidence Stair Management Technique:  Forwards;Two rails Number of Stairs: 4     Exercises General Exercises - Lower Extremity Long Arc Quad: AROM;Both;20 reps;Seated Hip Flexion/Marching: AROM;Both;20 reps;Seated   PT Diagnosis:    PT Problem List:   PT Treatment Interventions:     PT Goals Acute Rehab PT Goals Pt will go Supine/Side to Sit: with modified independence;Other (comment);with HOB 0 degrees (no rails) PT Goal: Supine/Side to Sit - Progress: Updated due to goal met PT Goal: Sit to Stand - Progress: Progressing toward goal PT Goal: Stand to Sit - Progress: Progressing toward goal PT Goal: Ambulate - Progress: Progressing toward goal  Visit Information  Last PT Received On: 10/04/11 Assistance Needed: +1    Subjective Data  Subjective: I'm feeling better this morning   Cognition  Overall Cognitive Status: Appears within functional limits for tasks assessed/performed Arousal/Alertness: Awake/alert Orientation Level: Appears intact for tasks assessed Behavior During Session: [facility] for tasks performed    Balance     End of Session PT - End of Session Activity Tolerance: Patient tolerated treatment well Patient left: in chair;with call bell/phone within reach Nurse Communication: Mobility status   GP     [name] 10/04/2011, 8:25 AM Delaney Meigs, PT [phone]

## 2011-10-04 NOTE — Discharge Summary (Signed)
Physician Discharge Summary  KIMESHA CLAXTON ZOX:[id number] DOB: 1925-02-10 DOA: 09/27/2011  PCP: No primary provider on file.  Admit date: 09/27/2011 Discharge date: 10/04/2011  Discharge Diagnoses:  Principal Problem:  *Colitis Active Problems:  Encephalopathy acute  Hypotension  Dehydration  Atrial fibrillation  Coagulopathy  SIRS (systemic inflammatory response syndrome)  Acidosis  Discharge Condition: stable for d/c  Disposition:  Follow-up Information    Follow up with Willa Rough, MD in 2 weeks. (afib follow up)    Contact information:   [address] [place] [address] [phone]          Diet:heart healthy diet. Wt Readings from Last 3 Encounters:  10/04/11 71.5 kg (157 lb 10.1 oz)    History of present illness:  78 yo with sudden onset emesis, diarrhea, lower abdominal pain and hypotension who initially presented to [facility] and was transferred to [facility] for further management.   Hospital Course:   Colitis: -She was admitted by the CCM secondary to her hypotension was started on aggressive IV fluids and start empiric antibiotic treatment CT scan of the abdomen and pelvis showed results below. Blood cultures were negative x2.  As her diarrhea improved antibiotics were changed to Cipro Flagyl she will continue for total 14 days.   Encephalopathy acute: -This is most likely secondary to decreased intravascular volume and hypotensive episode. Once her vital stabilize her mentation clear.  Hypotension/SIRS (systemic inflammatory response syndrome) -Slightly secondary to hypovolemic shock and or SIRS, secondary to the ongoing use of blood pressure medication and severe diarrhea. This resolve with IV fluids and pressors were not required.  Atrial fibrillation -Blood pressure medication and rate control medications were stopped on admission was her blood pressure started to trend up and her heart returns to increase she was started on  metoprolol. She was also continue her Coumadin.  -She'll followup with Dr. Myrtis Ser as an outpatient for atrial fibrillation. And lumbar clinic for her Coumadin clinic.   Acidosis -Metabolic acidosis most likely secondary to hypovolemia does resolve with IV fluids.  Discharge Exam: Filed Vitals:   10/04/11 0944  BP: 151/95  Pulse: 81  Temp:   Resp:    Filed Vitals:   10/03/11 1407 10/03/11 2040 10/04/11 0558 10/04/11 0944  BP: 127/78 124/65 143/79 151/95  Pulse: 82 96 90 81  Temp: 98.1 F (36.7 C) 97.8 F (36.6 C) 98.4 F (36.9 C)   TempSrc: Oral Oral Oral   Resp: 18 18 20    Height:      Weight:   71.5 kg (157 lb 10.1 oz)   SpO2: 100% 96% 97%    General: Awake alert and oriented times Cardiovascular: regular rate and rhythm Respiratory: Good air movement and clear to auscultation  Discharge Instructions  Discharge Orders    Future Orders Please Complete By Expires   Diet - low sodium heart healthy      Increase activity slowly        Medication List  As of 10/04/2011 11:15 AM   STOP taking these medications         polyethylene glycol powder powder         TAKE these medications         alendronate 70 MG tablet   Commonly known as: FOSAMAX   Take 70 mg by mouth every 7 (seven) days. Take with a full glass of water on an empty stomach for the bones      ARTHRITIS PAIN RELIEF 650 MG  CR tablet   Generic drug: acetaminophen   Take 650 mg by mouth daily as needed. For arthritis      cetirizine 10 MG tablet   Commonly known as: ZYRTEC   Take 10 mg by mouth daily. For allergies      ciprofloxacin 500 MG tablet   Commonly known as: CIPRO   Take 1 tablet (500 mg total) by mouth 2 (two) times daily.      levothyroxine 88 MCG tablet   Commonly known as: SYNTHROID, LEVOTHROID   Take 88 mcg by mouth daily. For thyroid      lisinopril-hydrochlorothiazide 20-25 MG per tablet   Commonly known as: PRINZIDE,ZESTORETIC   Take 1 tablet by mouth daily.      loperamide 2  MG capsule   Commonly known as: IMODIUM   Take 1 capsule (2 mg total) by mouth every 8 (eight) hours as needed for diarrhea or loose stools.      memantine 10 MG tablet   Commonly known as: NAMENDA   Take 10 mg by mouth 2 (two) times daily.      metoprolol tartrate 25 MG tablet   Commonly known as: LOPRESSOR   Take 1 tablet (25 mg total) by mouth 2 (two) times daily.      metroNIDAZOLE 500 MG tablet   Commonly known as: FLAGYL   Take 1 tablet (500 mg total) by mouth every 8 (eight) hours.      omeprazole 40 MG capsule   Commonly known as: PRILOSEC   Take 40 mg by mouth daily. For heartburn      potassium chloride SA 20 MEQ tablet   Commonly known as: K-DUR,KLOR-CON   Take 20 mEq by mouth daily.      saccharomyces boulardii 250 MG capsule   Commonly known as: FLORASTOR   Take 1 capsule (250 mg total) by mouth 2 (two) times daily.      simvastatin 40 MG tablet   Commonly known as: ZOCOR   Take 40 mg by mouth at bedtime. For cholesterol      VITAMIN D PO   Take 1 tablet by mouth daily.      warfarin 5 MG tablet   Commonly known as: COUMADIN   Take 2.5-5 mg by mouth daily. Take 5 MG every day except Tuesdays; Take 2.5 MG on Tuesdays.              The results of significant diagnostics from this hospitalization (including imaging, microbiology, ancillary and laboratory) are listed below for reference.    Significant Diagnostic Studies: Ct Abdomen Pelvis Wo Contrast  09/28/2011  *RADIOLOGY REPORT*  Clinical Data: Increasing abdominal pain in the left lower quadrant.  Nausea and vomiting.  Previous hysterectomy and appendectomy.  CT ABDOMEN AND PELVIS WITHOUT CONTRAST  Technique:  Multidetector CT imaging of the abdomen and pelvis was performed following the standard protocol without intravenous contrast.  Comparison: CT scan and chest and abdomen radiographs dated 09/27/2011  Findings: The patient has a new consolidative pneumonia in the left lower lobe with a tiny  effusion.  There is a new tiny right effusion with focal atelectasis in the right lower lobe. Moderate hiatal hernia.  The patient has developed acute colitis of the descending colon extending from the splenic flexure into the proximal sigmoid region.  This is new since the prior study.  There is diffuse pericolonic inflammation without an abscess.  There is a small amount of new ascites in the pelvis and in the left  pericolic gutter.  The liver, spleen, pancreas, left adrenal gland, and kidneys are normal.  There is a 14 mm low density lesion in the right adrenal gland consistent with benign adenoma.  Lipoma in the duodenum is again noted.  Cyst is noted on the right ovary, slightly smaller than on the prior study.  Left ovary is normal.  Uterus has been removed. Foley catheter in place.  No visible diverticula in the colon.  IMPRESSION: New acute colitis involving the descending colon with pericolonic inflammation and a small amount of ascites.  New left lower lobe pneumonia.  New tiny bilateral pleural effusions and right base atelectasis.  Original Report Authenticated By: Gwynn Burly, M.D.   Ct Abdomen Pelvis Wo Contrast  09/27/2011  *RADIOLOGY REPORT*  Clinical Data: Severe abdominal pain.  Low blood pressure.  CT ABDOMEN AND PELVIS WITHOUT CONTRAST  Technique:  Multidetector CT imaging of the abdomen and pelvis was performed following the standard protocol without intravenous contrast.  Comparison: None.  Findings: Minimal bilateral pleural effusions with atelectasis or infiltration in the lung bases.  Cardiac enlargement.  Coronary artery calcification.  Small esophageal hiatal hernia.  The unenhanced appearance of the liver, spleen, gallbladder, left adrenal gland, kidneys, and retroperitoneal lymph nodes is unremarkable.  There is a hypodense nodule in the right adrenal gland measuring 1.8 cm diameter consistent with an adenoma. Calcification of the abdominal aorta without aneurysm.  No  retroperitoneal hematomas.  Diffusely stool filled colon without significant distension or wall thickening. 6 mm fat density lesion in the second portion of the duodenum consistent with lipoma.  The stomach and small bowel are not abnormally distended.  No free air or free fluid in the abdomen.  Prominent visceral adipose tissues.  Pelvis:  A the uterus appears to be surgically absent.  There is a right-sided pelvic mass measuring 3.2 x 4.2 cm, likely representing the right ovary.  This is a moderately enlarged for age and ultrasound should be considered for followup.  The bladder is decompressed with Foley catheter.  No free air or free fluid in the abdomen.  No significant pelvic lymphadenopathy.  The appendix is not visualized.  Degenerative changes in the lumbar spine.  IMPRESSION:  Small esophageal hiatal hernia.  Minimal bilateral pleural effusions with basilar atelectasis or infiltration.  Right adrenal gland nodule.  Prominence of the right ovary.  Consider ultrasound for follow-up.  Diffusely stool filled colon without bowel obstruction.  Duodenal lipoma.  Original Report Authenticated By: Marlon Pel, M.D.   Dg Chest Port 1 View  09/30/2011  *RADIOLOGY REPORT*  Clinical Data: Follow-up evaluation of left lower lobe atelectasis or infiltrate.  PORTABLE CHEST - 1 VIEW  Comparison: 09/28/2011.  Findings: There is a right-sided internal jugular central venous catheter with tip terminating in the inferior aspect of the right atrium (approximately 6.5 cm below the superior cavoatrial junction). The lung volumes are low.  There are bibasilar opacities (left greater than right), which may represent areas of atelectasis and/or consolidation.  Small bilateral pleural effusions (left greater than right).  Pulmonary vasculature is within normal limits.  Heart size is mildly enlarged (unchanged). The patient is rotated to the right on today's exam, resulting in distortion of the mediastinal contours and  reduced diagnostic sensitivity and specificity for mediastinal pathology.  Atherosclerosis in the thoracic aorta.  IMPRESSION: 1.  Tip of right IJ central venous catheter is in the inferior aspect of the right atrium and could be retracted approximately 6.5 cm for more  optimal placement with tip at the superior cavoatrial junction. 2.  Decreasing lung volumes with increasing bibasilar areas of atelectasis and/or consolidation, with increasing small bilateral pleural effusions. 3.  Mild cardiomegaly. 4.  Atherosclerosis.  Original Report Authenticated By: Florencia Reasons, M.D.   Dg Chest Portable 1 View  09/28/2011  *RADIOLOGY REPORT*  Clinical Data: Right central line placement.  PORTABLE CHEST - 1 VIEW  Comparison: None.  Findings: The right central venous catheter placed with tip over the low right atrium.  If positioning as above the SVC / RA junction is desired, the catheter could be withdrawn about 7.7 cm. No pneumothorax.  Shallow inspiration.  Mild cardiac enlargement with normal pulmonary vascularity.  Interstitial changes suggesting fibrosis. Calcified and tortuous aorta.  No blunting of costophrenic angles. Mediastinal contours appear intact.  IMPRESSION: Right central venous catheter placed with tip over the low right atrium, about 7.7 cm below the SVC / RA junction.  No pneumothorax.  Original Report Authenticated By: Marlon Pel, M.D.   Dg Abd Acute W/chest  09/27/2011  *RADIOLOGY REPORT*  Clinical Data: Abdominal pain, shortness of breath  ACUTE ABDOMEN SERIES (ABDOMEN 2 VIEW & CHEST 1 VIEW)  Comparison: None.  Findings: Mild cardiomegaly with vascular congestion and chronic interstitial changes.  Negative for CHF or pneumonia.  No effusion or pneumothorax.  no definite free air.  Nonobstructive bowel gas pattern.  There is stool distally in the sigmoid region.  Mild nonspecific gaseous distention in the right abdomen.  Limited exam because of positioning.  Degenerative changes of the  spine and hips.  Pelvic calcifications consistent with venous phleboliths. Calcification over the right iliac crest noted, nonspecific this could be an injection granuloma versus an appendicolith.  IMPRESSION: Cardiomegaly without CHF or pneumonia  Negative for obstruction or free air.  Original Report Authenticated By: Judie Petit. Ruel Favors, M.D.    Microbiology: Recent Results (from the past 240 hour(s))  CULTURE, BLOOD (ROUTINE X 2)     Status: Normal   Collection Time   09/27/11 10:30 PM      Component Value Range Status Comment   Specimen Description BLOOD RIGHT ARM DRAWN BY RN   Final    Special Requests BOTTLES DRAWN AEROBIC AND ANAEROBIC 6CC   Final    Culture NO GROWTH 5 DAYS   Final    Report Status 10/02/2011 FINAL   Final   CULTURE, BLOOD (ROUTINE X 2)     Status: Normal   Collection Time   09/27/11 10:30 PM      Component Value Range Status Comment   Specimen Description BLOOD LEFT HAND   Final    Special Requests BOTTLES DRAWN AEROBIC AND ANAEROBIC 6CC   Final    Culture NO GROWTH 5 DAYS   Final    Report Status 10/02/2011 FINAL   Final   MRSA PCR SCREENING     Status: Normal   Collection Time   09/28/11  1:31 AM      Component Value Range Status Comment   MRSA by PCR NEGATIVE  NEGATIVE Final   CLOSTRIDIUM DIFFICILE BY PCR     Status: Normal   Collection Time   10/01/11 10:23 AM      Component Value Range Status Comment   C difficile by pcr NEGATIVE  NEGATIVE Final      Labs: Basic Metabolic Panel:  Lab 10/01/11 1191 09/29/11 0500 09/28/11 2153 09/28/11 0415 09/27/11 2146  NA 139 140 138 141 138  K 3.3* 3.1* -- -- --  CL 103 105 104 106 104  CO2 28 27 24 20  17*  GLUCOSE 88 86 99 214* 232*  BUN 13 19 19 19 18   CREATININE 0.65 0.70 0.76 0.83 0.92  CALCIUM 9.0 8.2* 8.5 9.1 10.0  MG -- -- -- -- --  PHOS -- -- -- -- --   Liver Function Tests:  Lab 09/27/11 2146  AST 23  ALT 15  ALKPHOS 99  BILITOT 0.4  PROT 5.6*  ALBUMIN 3.3*    Lab 09/28/11 0415 09/27/11 2146    LIPASE -- 45  AMYLASE 73 --   No results found for this basename: AMMONIA:5 in the last 168 hours CBC:  Lab 10/01/11 0605 09/29/11 0500 09/28/11 0800 09/27/11 2146  WBC 7.4 12.3* 15.2* 14.9*  NEUTROABS -- -- 14.1* 12.6*  HGB 12.7 11.9* 13.4 12.9  HCT 37.1 35.1* 38.9 37.7  MCV 90.7 91.9 91.3 92.0  PLT 173 148* 176 179   Cardiac Enzymes:  Lab 09/27/11 2146  CKTOTAL --  CKMB --  CKMBINDEX --  TROPONINI <0.30   BNP: No components found with this basename: POCBNP:5 CBG:  Lab 09/29/11 0115 09/28/11 1946 09/28/11 0137  GLUCAP 92 129* 184*    Time coordinating discharge: Greater than 45 minutes  Signed:  Marinda Elk  [facility] 10/04/2011, 11:15 AM

## 2011-10-04 NOTE — Progress Notes (Signed)
CIR Admissions:Noted that pt ambulated 500' with supervision this AM with PT. Met with pt and she reports that she is feeling much better and does not need to come to CIR. She feels confident that she can manage independently at home with [facility] and intermittent help from family.   Reported above to pt's nurse and CM. Call with questions: [phone].

## 2011-10-28 ENCOUNTER — Ambulatory Visit (INDEPENDENT_AMBULATORY_CARE_PROVIDER_SITE_OTHER): Payer: Medicare Other | Admitting: Cardiology

## 2011-10-28 ENCOUNTER — Encounter: Payer: Self-pay | Admitting: Cardiology

## 2011-10-28 VITALS — BP 116/79 | HR 78 | Ht 70.0 in | Wt 151.0 lb

## 2011-10-28 DIAGNOSIS — Z7901 Long term (current) use of anticoagulants: Secondary | ICD-10-CM

## 2011-10-28 DIAGNOSIS — I4891 Unspecified atrial fibrillation: Secondary | ICD-10-CM

## 2011-10-28 DIAGNOSIS — I10 Essential (primary) hypertension: Secondary | ICD-10-CM

## 2011-10-28 DIAGNOSIS — E785 Hyperlipidemia, unspecified: Secondary | ICD-10-CM | POA: Insufficient documentation

## 2011-10-28 NOTE — Progress Notes (Signed)
Patient ID: Tamara [name], female   DOB: [birth year], 78 y.o.   MRN: [id number]  HPI: Initial Cardiology evaluation for assessment and treatment of long-standing atrial fibrillation.  [name] was previously under the care of [facility], but moved to [facility] 4 years ago and has not been seen by a cardiologist since.  Her primary care physician, Dr. Margo Aye, has ably managed chronic anticoagulation, which has been maintained for many years without abnormal bleeding or other adverse effects.  Other than her arrhythmia and hypertension, patient has no known cardiac problems.  Current Outpatient Prescriptions on File Prior to Visit  Medication Sig Dispense Refill  . acetaminophen (ARTHRITIS PAIN RELIEF) 650 MG CR tablet Take 650 mg by mouth daily as needed. For arthritis      . alendronate (FOSAMAX) 70 MG tablet Take 70 mg by mouth every 7 (seven) days. Take with a full glass of water on an empty stomach for the bones      . cetirizine (ZYRTEC) 10 MG tablet Take 10 mg by mouth daily. For allergies      . Cholecalciferol (VITAMIN D PO) Take 1 tablet by mouth daily.      Marland Kitchen levothyroxine (SYNTHROID, LEVOTHROID) 88 MCG tablet Take 88 mcg by mouth daily. For thyroid      . lisinopril-hydrochlorothiazide (PRINZIDE,ZESTORETIC) 20-25 MG per tablet Take 1 tablet by mouth daily.      . memantine (NAMENDA) 10 MG tablet Take 10 mg by mouth 2 (two) times daily.      . metoprolol tartrate (LOPRESSOR) 25 MG tablet Take 1 tablet (25 mg total) by mouth 2 (two) times daily.  60 tablet  0  . omeprazole (PRILOSEC) 40 MG capsule Take 40 mg by mouth daily. For heartburn      . potassium chloride SA (K-DUR,KLOR-CON) 20 MEQ tablet Take 20 mEq by mouth daily.      . simvastatin (ZOCOR) 40 MG tablet Take 40 mg by mouth at bedtime. For cholesterol      . warfarin (COUMADIN) 5 MG tablet Take 2.5-5 mg by mouth daily. Take 5 MG every day except Tuesdays; Take 2.5 MG on Tuesdays.       No Known Allergies    Past Medical History  Diagnosis Date  . Hypertension   . Atrial fibrillation   . Osteoporosis   . Arthritis   . Chronic anticoagulation     Past Surgical History  Procedure Date  . Abdominal hysterectomy   . Appendectomy     Family History  Problem Relation Age of Onset  . Heart attack Father   . Cancer Mother   . Hypertension Father     History   Social History  . Marital Status: Widowed    Spouse Name: N/A    Number of Children: N/A  . Years of Education: N/A   Occupational History  . Not on file.   Social History Main Topics  . Smoking status: Never Smoker   . Smokeless tobacco: Never Used  . Alcohol Use: No  . Drug Use: No  . Sexually Active: No   Other Topics Concern  . Not on file   Social History Narrative  . No narrative on file    ROS: Patient reports intermittent constipation and occasional stress incontinence.  Colonoscopy performed 2005 and was apparently negative.  All other systems reviewed and are negative.  PHYSICAL EXAM: BP 116/79  Pulse 78  Ht 5\' 10"  (1.778 m)  Wt 68.493 kg (151 lb)  BMI  21.67 kg/m2  General-Well-developed; no acute distress Body Habitus-Thin HEENT-Klemme/AT; PERRL; EOM intact; conjunctiva and lids nl Neck-No JVD; no carotid bruits Endocrine-No thyromegaly Lungs-Clear lung fields; resonant percussion; normal I-to-E ratio Cardiovascular- normal PMI; normal S1 and S2; irregular rhythm Abdomen-BS normal; soft and non-tender without masses or organomegaly Musculoskeletal-No deformities, cyanosis or clubbing Neurologic-Nl cranial nerves; symmetric strength and tone Skin- Warm, no significant lesions Extremities-Nl distal pulses; Trace edema; prominent surface varicose veins are  EKG:  Tracing performed 09/29/11 obtained and reviewed.  Atrial flutter with variable AV block; low voltage; cannot exclude previous inferior myocardial infarction; delayed R-wave progression; nonspecific ST-T wave abnormalities.  ASSESSMENT AND  PLAN:  Tamara Bing, MD 10/28/2011 2:42 PM

## 2011-10-28 NOTE — Assessment & Plan Note (Signed)
Patient has done well with long-term warfarin treatment.  We discussed alternative oral agents, but she wishes to continue current therapy.  We will monitor for occult GI blood loss.

## 2011-10-28 NOTE — Patient Instructions (Addendum)
Your physician recommends that you schedule a follow-up appointment in: 1 year Your physician recommends that you return for lab work in: today  

## 2011-10-28 NOTE — Assessment & Plan Note (Signed)
No blood pressures in excess of 150 systolic or 90 diastolic had been recorded.  Current therapy will be continued.  Hypokalemia present during recent hospital admission, likely as a result of GI problems, but diuretic may have been contributing.  A repeat chemistry profile will be obtained.

## 2011-10-28 NOTE — Assessment & Plan Note (Signed)
No recent lipid profile available.  Due to advanced age and absence of vascular disease, careful monitoring of hyperlipidemia does not appear absolutely necessary.

## 2011-10-28 NOTE — Progress Notes (Deleted)
Name: Tamara [name]    DOB: 07/12/1924  Age: 78 y.o.  MR#: [id number]       PCP:  No primary provider on file.      Insurance: @PAYORNAME @   CC:   No chief complaint on file.   VS BP 116/79  Pulse 78  Ht 5\' 10"  (1.778 m)  Wt 151 lb (68.493 kg)  BMI 21.67 kg/m2  Weights Current Weight  10/28/11 151 lb (68.493 kg)  10/04/11 157 lb 10.1 oz (71.5 kg)    Blood Pressure  BP Readings from Last 3 Encounters:  10/28/11 116/79  10/04/11 127/89     Admit date:  (Not on file) Last encounter with RMR:  Visit date not found   Allergy No Known Allergies  Current Outpatient Prescriptions  Medication Sig Dispense Refill  . acetaminophen (ARTHRITIS PAIN RELIEF) 650 MG CR tablet Take 650 mg by mouth daily as needed. For arthritis      . alendronate (FOSAMAX) 70 MG tablet Take 70 mg by mouth every 7 (seven) days. Take with a full glass of water on an empty stomach for the bones      . cetirizine (ZYRTEC) 10 MG tablet Take 10 mg by mouth daily. For allergies      . Cholecalciferol (VITAMIN D PO) Take 1 tablet by mouth daily.      Marland Kitchen levothyroxine (SYNTHROID, LEVOTHROID) 88 MCG tablet Take 88 mcg by mouth daily. For thyroid      . lisinopril-hydrochlorothiazide (PRINZIDE,ZESTORETIC) 20-25 MG per tablet Take 1 tablet by mouth daily.      . memantine (NAMENDA) 10 MG tablet Take 10 mg by mouth 2 (two) times daily.      . metoprolol tartrate (LOPRESSOR) 25 MG tablet Take 1 tablet (25 mg total) by mouth 2 (two) times daily.  60 tablet  0  . omeprazole (PRILOSEC) 40 MG capsule Take 40 mg by mouth daily. For heartburn      . potassium chloride SA (K-DUR,KLOR-CON) 20 MEQ tablet Take 20 mEq by mouth daily.      Marland Kitchen saccharomyces boulardii (FLORASTOR) 250 MG capsule Take 250 mg by mouth 2 (two) times daily.      . simvastatin (ZOCOR) 40 MG tablet Take 40 mg by mouth at bedtime. For cholesterol      . warfarin (COUMADIN) 5 MG tablet Take 2.5-5 mg by mouth daily. Take 5 MG every day except Tuesdays; Take  2.5 MG on Tuesdays.        Discontinued Meds:   There are no discontinued medications.  Patient Active Problem List  Diagnosis  . Hypotension  . Dehydration  . Colitis  . Atrial fibrillation  . Coagulopathy  . SIRS (systemic inflammatory response syndrome)  . Encephalopathy acute  . Acidosis    LABS Admission on 09/27/2011, Discharged on 10/04/2011  No results displayed because visit has over 200 results.       Results for this Opt Visit:     Results for orders placed during the hospital encounter of 09/27/11  CBC WITH DIFFERENTIAL      Component Value Range   WBC 14.9 (*) 4.0 - 10.5 K/uL   RBC 4.10  3.87 - 5.11 MIL/uL   Hemoglobin 12.9  12.0 - 15.0 g/dL   HCT 46.9  62.9 - 52.8 %   MCV 92.0  78.0 - 100.0 fL   MCH 31.5  26.0 - 34.0 pg   MCHC 34.2  30.0 - 36.0 g/dL   RDW 41.3  24.4 -  15.5 %   Platelets 179  150 - 400 K/uL   Neutrophils Relative 84 (*) 43 - 77 %   Lymphocytes Relative 13  12 - 46 %   Monocytes Relative 2 (*) 3 - 12 %   Eosinophils Relative 1  0 - 5 %   Basophils Relative 0  0 - 1 %   Neutro Abs 12.6 (*) 1.7 - 7.7 K/uL   Lymphs Abs 1.9  0.7 - 4.0 K/uL   Monocytes Absolute 0.3  0.1 - 1.0 K/uL   Eosinophils Absolute 0.1  0.0 - 0.7 K/uL   Basophils Absolute 0.0  0.0 - 0.1 K/uL   RBC Morphology SPHEROCYTES     WBC Morphology TOXIC GRANULATION     Smear Review LARGE PLATELETS PRESENT    COMPREHENSIVE METABOLIC PANEL      Component Value Range   Sodium 138  135 - 145 mEq/L   Potassium 2.6 (*) 3.5 - 5.1 mEq/L   Chloride 104  96 - 112 mEq/L   CO2 17 (*) 19 - 32 mEq/L   Glucose, Bld 232 (*) 70 - 99 mg/dL   BUN 18  6 - 23 mg/dL   Creatinine, Ser 1.61  0.50 - 1.10 mg/dL   Calcium 09.6  8.4 - 04.5 mg/dL   Total Protein 5.6 (*) 6.0 - 8.3 g/dL   Albumin 3.3 (*) 3.5 - 5.2 g/dL   AST 23  0 - 37 U/L   ALT 15  0 - 35 U/L   Alkaline Phosphatase 99  39 - 117 U/L   Total Bilirubin 0.4  0.3 - 1.2 mg/dL   GFR calc non Af Amer 56 (*) >90 mL/min   GFR calc Af  Amer 64 (*) >90 mL/min  LIPASE, BLOOD      Component Value Range   Lipase 45  11 - 59 U/L  LACTIC ACID, PLASMA      Component Value Range   Lactic Acid, Venous 5.7 (*) 0.5 - 2.2 mmol/L  TROPONIN I      Component Value Range   Troponin I <0.30  <0.30 ng/mL  URINALYSIS, ROUTINE W REFLEX MICROSCOPIC      Component Value Range   Color, Urine YELLOW  YELLOW   APPearance CLEAR  CLEAR   Specific Gravity, Urine 1.020  1.005 - 1.030   pH 7.0  5.0 - 8.0   Glucose, UA 100 (*) NEGATIVE mg/dL   Hgb urine dipstick NEGATIVE  NEGATIVE   Bilirubin Urine NEGATIVE  NEGATIVE   Ketones, ur NEGATIVE  NEGATIVE mg/dL   Protein, ur 409 (*) NEGATIVE mg/dL   Urobilinogen, UA 0.2  0.0 - 1.0 mg/dL   Nitrite NEGATIVE  NEGATIVE   Leukocytes, UA NEGATIVE  NEGATIVE  PROTIME-INR      Component Value Range   Prothrombin Time 24.0 (*) 11.6 - 15.2 seconds   INR 2.11 (*) 0.00 - 1.49  PROCALCITONIN      Component Value Range   Procalcitonin <0.10    TYPE AND SCREEN      Component Value Range   ABO/RH(D) B POS     Antibody Screen NEG     Sample Expiration 09/30/2011    BLOOD GAS, ARTERIAL      Component Value Range   O2 Content 100.0     Delivery systems OXYGEN MASK     pH, Arterial 7.319 (*) 7.350 - 7.450   pCO2 arterial 32.3 (*) 35.0 - 45.0 mmHg   pO2, Arterial 231.0 (*) 80.0 -  100.0 mmHg   Bicarbonate 16.1 (*) 20.0 - 24.0 mEq/L   TCO2 14.7  0 - 100 mmol/L   Acid-base deficit 8.8 (*) 0.0 - 2.0 mmol/L   O2 Saturation 99.3     Patient temperature 37.0     Collection site RIGHT RADIAL     Drawn by [id number]     Sample type ARTERIAL     Allens test (pass/fail) PASS  PASS  CULTURE, BLOOD (ROUTINE X 2)      Component Value Range   Specimen Description BLOOD RIGHT ARM DRAWN BY RN     Special Requests BOTTLES DRAWN AEROBIC AND ANAEROBIC 6CC     Culture NO GROWTH 5 DAYS     Report Status 10/02/2011 FINAL    CULTURE, BLOOD (ROUTINE X 2)      Component Value Range   Specimen Description BLOOD LEFT HAND      Special Requests BOTTLES DRAWN AEROBIC AND ANAEROBIC 6CC     Culture NO GROWTH 5 DAYS     Report Status 10/02/2011 FINAL    KETONES, QUALITATIVE      Component Value Range   Acetone, Bld NEGATIVE  NEGATIVE  URINE MICROSCOPIC-ADD ON      Component Value Range   Squamous Epithelial / LPF FEW (*) RARE   WBC, UA 3-6  <3 WBC/hpf   Bacteria, UA FEW (*) RARE   Casts HYALINE CASTS (*) NEGATIVE  MRSA PCR SCREENING      Component Value Range   MRSA by PCR NEGATIVE  NEGATIVE  LACTIC ACID, PLASMA      Component Value Range   Lactic Acid, Venous 4.8 (*) 0.5 - 2.2 mmol/L  BASIC METABOLIC PANEL      Component Value Range   Sodium 141  135 - 145 mEq/L   Potassium 2.8 (*) 3.5 - 5.1 mEq/L   Chloride 106  96 - 112 mEq/L   CO2 20  19 - 32 mEq/L   Glucose, Bld 214 (*) 70 - 99 mg/dL   BUN 19  6 - 23 mg/dL   Creatinine, Ser 1.61  0.50 - 1.10 mg/dL   Calcium 9.1  8.4 - 09.6 mg/dL   GFR calc non Af Amer 63 (*) >90 mL/min   GFR calc Af Amer 73 (*) >90 mL/min  GLUCOSE, CAPILLARY      Component Value Range   Glucose-Capillary 184 (*) 70 - 99 mg/dL   Comment 1 Documented in Chart     Comment 2 Notify RN    AMYLASE      Component Value Range   Amylase 73  0 - 105 U/L  BASIC METABOLIC PANEL      Component Value Range   Sodium 138  135 - 145 mEq/L   Potassium 3.5  3.5 - 5.1 mEq/L   Chloride 104  96 - 112 mEq/L   CO2 24  19 - 32 mEq/L   Glucose, Bld 99  70 - 99 mg/dL   BUN 19  6 - 23 mg/dL   Creatinine, Ser 0.45  0.50 - 1.10 mg/dL   Calcium 8.5  8.4 - 40.9 mg/dL   GFR calc non Af Amer 75 (*) >90 mL/min   GFR calc Af Amer 87 (*) >90 mL/min  CBC WITH DIFFERENTIAL      Component Value Range   WBC 15.2 (*) 4.0 - 10.5 K/uL   RBC 4.26  3.87 - 5.11 MIL/uL   Hemoglobin 13.4  12.0 - 15.0 g/dL   HCT 38.9  36.0 - 46.0 %   MCV 91.3  78.0 - 100.0 fL   MCH 31.5  26.0 - 34.0 pg   MCHC 34.4  30.0 - 36.0 g/dL   RDW 86.5  78.4 - 69.6 %   Platelets 176  150 - 400 K/uL   Neutrophils Relative 93 (*) 43 - 77 %    Lymphocytes Relative 2 (*) 12 - 46 %   Monocytes Relative 5  3 - 12 %   Eosinophils Relative 0  0 - 5 %   Basophils Relative 0  0 - 1 %   Neutro Abs 14.1 (*) 1.7 - 7.7 K/uL   Lymphs Abs 0.3 (*) 0.7 - 4.0 K/uL   Monocytes Absolute 0.8  0.1 - 1.0 K/uL   Eosinophils Absolute 0.0  0.0 - 0.7 K/uL   Basophils Absolute 0.0  0.0 - 0.1 K/uL   WBC Morphology INCREASED BANDS (>20% BANDS)    OCCULT BLOOD, POC DEVICE      Component Value Range   Fecal Occult Bld NEGATIVE    TYPE AND SCREEN      Component Value Range   ABO/RH(D) B POS     Antibody Screen NEG     Sample Expiration 10/01/2011    PREPARE FRESH FROZEN PLASMA      Component Value Range   Unit Number [id number]     Blood Component Type THAWED PLASMA     Unit division 00     Status of Unit ISSUED,FINAL     Transfusion Status OK TO TRANSFUSE     Unit Number [id number]     Blood Component Type THAWED PLASMA     Unit division 00     Status of Unit ISSUED,FINAL     Transfusion Status OK TO TRANSFUSE    ABO/RH      Component Value Range   ABO/RH(D) B POS    BASIC METABOLIC PANEL      Component Value Range   Sodium 140  135 - 145 mEq/L   Potassium 3.1 (*) 3.5 - 5.1 mEq/L   Chloride 105  96 - 112 mEq/L   CO2 27  19 - 32 mEq/L   Glucose, Bld 86  70 - 99 mg/dL   BUN 19  6 - 23 mg/dL   Creatinine, Ser 3.66  0.50 - 1.10 mg/dL   Calcium 8.2 (*) 8.4 - 10.5 mg/dL   GFR calc non Af Amer 77 (*) >90 mL/min   GFR calc Af Amer 90 (*) >90 mL/min  CBC      Component Value Range   WBC 12.3 (*) 4.0 - 10.5 K/uL   RBC 3.82 (*) 3.87 - 5.11 MIL/uL   Hemoglobin 11.9 (*) 12.0 - 15.0 g/dL   HCT 44.0 (*) 34.7 - 42.5 %   MCV 91.9  78.0 - 100.0 fL   MCH 31.2  26.0 - 34.0 pg   MCHC 33.9  30.0 - 36.0 g/dL   RDW 95.6  38.7 - 56.4 %   Platelets 148 (*) 150 - 400 K/uL  GLUCOSE, CAPILLARY      Component Value Range   Glucose-Capillary 129 (*) 70 - 99 mg/dL  GLUCOSE, CAPILLARY      Component Value Range   Glucose-Capillary 92  70 - 99 mg/dL    Comment 1 Documented in Chart     Comment 2 Notify RN    PROTIME-INR      Component Value Range   Prothrombin Time 20.8 (*) 11.6 - 15.2 seconds  INR 1.76 (*) 0.00 - 1.49  BASIC METABOLIC PANEL      Component Value Range   Sodium 139  135 - 145 mEq/L   Potassium 3.3 (*) 3.5 - 5.1 mEq/L   Chloride 103  96 - 112 mEq/L   CO2 28  19 - 32 mEq/L   Glucose, Bld 88  70 - 99 mg/dL   BUN 13  6 - 23 mg/dL   Creatinine, Ser 4.09  0.50 - 1.10 mg/dL   Calcium 9.0  8.4 - 81.1 mg/dL   GFR calc non Af Amer 79 (*) >90 mL/min   GFR calc Af Amer >90  >90 mL/min  CBC      Component Value Range   WBC 7.4  4.0 - 10.5 K/uL   RBC 4.09  3.87 - 5.11 MIL/uL   Hemoglobin 12.7  12.0 - 15.0 g/dL   HCT 91.4  78.2 - 95.6 %   MCV 90.7  78.0 - 100.0 fL   MCH 31.1  26.0 - 34.0 pg   MCHC 34.2  30.0 - 36.0 g/dL   RDW 21.3  08.6 - 57.8 %   Platelets 173  150 - 400 K/uL  PROTIME-INR      Component Value Range   Prothrombin Time 23.4 (*) 11.6 - 15.2 seconds   INR 2.04 (*) 0.00 - 1.49  CLOSTRIDIUM DIFFICILE BY PCR      Component Value Range   C difficile by pcr NEGATIVE  NEGATIVE  PROTIME-INR      Component Value Range   Prothrombin Time 32.4 (*) 11.6 - 15.2 seconds   INR 3.10 (*) 0.00 - 1.49  PROTIME-INR      Component Value Range   Prothrombin Time 35.2 (*) 11.6 - 15.2 seconds   INR 3.44 (*) 0.00 - 1.49  PROTIME-INR      Component Value Range   Prothrombin Time 25.1 (*) 11.6 - 15.2 seconds   INR 2.23 (*) 0.00 - 1.49    EKG Orders placed during the hospital encounter of 09/27/11  . EKG 12-LEAD  . EKG 12-LEAD  . EKG 12-LEAD  . EKG 12-LEAD  . EKG 12-LEAD  . EKG 12-LEAD  . EKG  . EKG 12-LEAD  . EKG 12-LEAD  . EKG 12-LEAD  . EKG 12-LEAD  . EKG 12-LEAD  . EKG 12-LEAD  . EKG 12-LEAD  . EKG 12-LEAD     Prior Assessment and Plan Problem List as of 10/28/2011            Cardiology Problems   Hypotension   Atrial fibrillation     Other   Dehydration   Colitis   Coagulopathy   SIRS  (systemic inflammatory response syndrome)   Encephalopathy acute   Acidosis       Imaging: Ct Abdomen Pelvis Wo Contrast  09/28/2011  *RADIOLOGY REPORT*  Clinical Data: Increasing abdominal pain in the left lower quadrant.  Nausea and vomiting.  Previous hysterectomy and appendectomy.  CT ABDOMEN AND PELVIS WITHOUT CONTRAST  Technique:  Multidetector CT imaging of the abdomen and pelvis was performed following the standard protocol without intravenous contrast.  Comparison: CT scan and chest and abdomen radiographs dated 09/27/2011  Findings: The patient has a new consolidative pneumonia in the left lower lobe with a tiny effusion.  There is a new tiny right effusion with focal atelectasis in the right lower lobe. Moderate hiatal hernia.  The patient has developed acute colitis of the descending colon extending from the splenic flexure into  the proximal sigmoid region.  This is new since the prior study.  There is diffuse pericolonic inflammation without an abscess.  There is a small amount of new ascites in the pelvis and in the left pericolic gutter.  The liver, spleen, pancreas, left adrenal gland, and kidneys are normal.  There is a 14 mm low density lesion in the right adrenal gland consistent with benign adenoma.  Lipoma in the duodenum is again noted.  Cyst is noted on the right ovary, slightly smaller than on the prior study.  Left ovary is normal.  Uterus has been removed. Foley catheter in place.  No visible diverticula in the colon.  IMPRESSION: New acute colitis involving the descending colon with pericolonic inflammation and a small amount of ascites.  New left lower lobe pneumonia.  New tiny bilateral pleural effusions and right base atelectasis.  Original Report Authenticated By: Gwynn Burly, M.D.   Dg Chest Port 1 View  09/30/2011  *RADIOLOGY REPORT*  Clinical Data: Follow-up evaluation of left lower lobe atelectasis or infiltrate.  PORTABLE CHEST - 1 VIEW  Comparison: 09/28/2011.   Findings: There is a right-sided internal jugular central venous catheter with tip terminating in the inferior aspect of the right atrium (approximately 6.5 cm below the superior cavoatrial junction). The lung volumes are low.  There are bibasilar opacities (left greater than right), which may represent areas of atelectasis and/or consolidation.  Small bilateral pleural effusions (left greater than right).  Pulmonary vasculature is within normal limits.  Heart size is mildly enlarged (unchanged). The patient is rotated to the right on today's exam, resulting in distortion of the mediastinal contours and reduced diagnostic sensitivity and specificity for mediastinal pathology.  Atherosclerosis in the thoracic aorta.  IMPRESSION: 1.  Tip of right IJ central venous catheter is in the inferior aspect of the right atrium and could be retracted approximately 6.5 cm for more optimal placement with tip at the superior cavoatrial junction. 2.  Decreasing lung volumes with increasing bibasilar areas of atelectasis and/or consolidation, with increasing small bilateral pleural effusions. 3.  Mild cardiomegaly. 4.  Atherosclerosis.  Original Report Authenticated By: Florencia Reasons, M.D.     [facility] Calculation: Score not calculated

## 2011-10-28 NOTE — Assessment & Plan Note (Signed)
Patient appears to be asymptomatic with respect to atrial fibrillation.  Heart rate is well controlled at rest.  She has no symptoms related to exertion.  The strategy of rate control and anticoagulation with warfarin has been effective for many years and will be continued.

## 2011-10-29 LAB — CBC
MCHC: 33.3 g/dL (ref 30.0–36.0)
MCV: 91.3 fL (ref 78.0–100.0)
Platelets: 278 10*3/uL (ref 150–400)
RDW: 14.5 % (ref 11.5–15.5)
WBC: 6.8 10*3/uL (ref 4.0–10.5)

## 2011-10-29 LAB — COMPREHENSIVE METABOLIC PANEL
ALT: 15 U/L (ref 0–35)
AST: 19 U/L (ref 0–37)
CO2: 32 mEq/L (ref 19–32)
Chloride: 101 mEq/L (ref 96–112)
Sodium: 139 mEq/L (ref 135–145)
Total Bilirubin: 0.6 mg/dL (ref 0.3–1.2)
Total Protein: 6 g/dL (ref 6.0–8.3)

## 2011-11-04 ENCOUNTER — Encounter: Payer: Self-pay | Admitting: Cardiology

## 2012-02-04 ENCOUNTER — Emergency Department (HOSPITAL_COMMUNITY): Payer: Medicare Other

## 2012-02-04 ENCOUNTER — Encounter (HOSPITAL_COMMUNITY): Payer: Self-pay

## 2012-02-04 ENCOUNTER — Emergency Department (HOSPITAL_COMMUNITY)
Admission: EM | Admit: 2012-02-04 | Discharge: 2012-02-04 | Disposition: A | Discharge status: Home / Self Care | Payer: Medicare Other | Attending: Emergency Medicine | Admitting: Emergency Medicine

## 2012-02-04 DIAGNOSIS — M810 Age-related osteoporosis without current pathological fracture: Secondary | ICD-10-CM | POA: Insufficient documentation

## 2012-02-04 DIAGNOSIS — D689 Coagulation defect, unspecified: Secondary | ICD-10-CM | POA: Insufficient documentation

## 2012-02-04 DIAGNOSIS — Z8719 Personal history of other diseases of the digestive system: Secondary | ICD-10-CM | POA: Insufficient documentation

## 2012-02-04 DIAGNOSIS — F0390 Unspecified dementia without behavioral disturbance: Secondary | ICD-10-CM | POA: Insufficient documentation

## 2012-02-04 DIAGNOSIS — R0789 Other chest pain: Secondary | ICD-10-CM

## 2012-02-04 DIAGNOSIS — Z8679 Personal history of other diseases of the circulatory system: Secondary | ICD-10-CM | POA: Insufficient documentation

## 2012-02-04 DIAGNOSIS — Z79899 Other long term (current) drug therapy: Secondary | ICD-10-CM | POA: Insufficient documentation

## 2012-02-04 DIAGNOSIS — Z7901 Long term (current) use of anticoagulants: Secondary | ICD-10-CM | POA: Insufficient documentation

## 2012-02-04 DIAGNOSIS — N390 Urinary tract infection, site not specified: Secondary | ICD-10-CM

## 2012-02-04 DIAGNOSIS — I10 Essential (primary) hypertension: Secondary | ICD-10-CM | POA: Insufficient documentation

## 2012-02-04 DIAGNOSIS — J159 Unspecified bacterial pneumonia: Secondary | ICD-10-CM | POA: Insufficient documentation

## 2012-02-04 DIAGNOSIS — R071 Chest pain on breathing: Secondary | ICD-10-CM | POA: Insufficient documentation

## 2012-02-04 DIAGNOSIS — J189 Pneumonia, unspecified organism: Secondary | ICD-10-CM

## 2012-02-04 DIAGNOSIS — E785 Hyperlipidemia, unspecified: Secondary | ICD-10-CM | POA: Insufficient documentation

## 2012-02-04 DIAGNOSIS — R0602 Shortness of breath: Secondary | ICD-10-CM | POA: Insufficient documentation

## 2012-02-04 LAB — URINALYSIS, ROUTINE W REFLEX MICROSCOPIC
Glucose, UA: NEGATIVE mg/dL
Ketones, ur: NEGATIVE mg/dL
Leukocytes, UA: NEGATIVE
Nitrite: NEGATIVE
Specific Gravity, Urine: 1.025 (ref 1.005–1.030)
pH: 6 (ref 5.0–8.0)

## 2012-02-04 LAB — URINE MICROSCOPIC-ADD ON

## 2012-02-04 LAB — CBC WITH DIFFERENTIAL/PLATELET
Basophils Absolute: 0 10*3/uL (ref 0.0–0.1)
Basophils Relative: 0 % (ref 0–1)
Eosinophils Absolute: 0 10*3/uL (ref 0.0–0.7)
Hemoglobin: 15.7 g/dL — ABNORMAL HIGH (ref 12.0–15.0)
MCH: 31 pg (ref 26.0–34.0)
MCHC: 34.2 g/dL (ref 30.0–36.0)
Monocytes Relative: 11 % (ref 3–12)
Neutro Abs: 11.1 10*3/uL — ABNORMAL HIGH (ref 1.7–7.7)
Neutrophils Relative %: 85 % — ABNORMAL HIGH (ref 43–77)
Platelets: 241 10*3/uL (ref 150–400)
RDW: 13.4 % (ref 11.5–15.5)

## 2012-02-04 LAB — HEPATIC FUNCTION PANEL
AST: 18 U/L (ref 0–37)
Albumin: 4 g/dL (ref 3.5–5.2)
Total Bilirubin: 0.8 mg/dL (ref 0.3–1.2)
Total Protein: 7.5 g/dL (ref 6.0–8.3)

## 2012-02-04 LAB — PROTIME-INR
INR: 2.5 — ABNORMAL HIGH (ref 0.00–1.49)
Prothrombin Time: 25.8 seconds — ABNORMAL HIGH (ref 11.6–15.2)

## 2012-02-04 LAB — BASIC METABOLIC PANEL
Calcium: 10.6 mg/dL — ABNORMAL HIGH (ref 8.4–10.5)
GFR calc non Af Amer: 78 mL/min — ABNORMAL LOW (ref >90)
Potassium: 3.4 mEq/L — ABNORMAL LOW (ref 3.5–5.1)
Sodium: 136 mEq/L (ref 135–145)

## 2012-02-04 LAB — APTT: aPTT: 34 seconds (ref 24–37)

## 2012-02-04 LAB — TROPONIN I: Troponin I: <0.3 ng/mL (ref <0.30)

## 2012-02-04 LAB — LIPASE, BLOOD: Lipase: 33 U/L (ref 11–59)

## 2012-02-04 MED ORDER — SODIUM CHLORIDE 0.9 % IV SOLN
Freq: Once | INTRAVENOUS | Status: AC
  Administered 2012-02-04: 1000 mL via INTRAVENOUS

## 2012-02-04 MED ORDER — LEVOFLOXACIN 500 MG PO TABS
500.0000 mg | ORAL_TABLET | Freq: Every day | ORAL | Status: DC
  Administered 2012-02-04: 500 mg via ORAL
  Filled 2012-02-04: qty 1

## 2012-02-04 MED ORDER — ONDANSETRON HCL 4 MG/2ML IJ SOLN
4.0000 mg | Freq: Once | INTRAMUSCULAR | Status: AC
  Administered 2012-02-04: 4 mg via INTRAVENOUS
  Filled 2012-02-04: qty 2

## 2012-02-04 MED ORDER — LEVOFLOXACIN 500 MG PO TABS: 500.0000 mg | ORAL_TABLET | Freq: Every day | ORAL | Status: DC

## 2012-02-04 MED ORDER — MORPHINE SULFATE 4 MG/ML IJ SOLN
4.0000 mg | Freq: Once | INTRAMUSCULAR | Status: AC
  Administered 2012-02-04: 4 mg via INTRAVENOUS
  Filled 2012-02-04: qty 1

## 2012-02-04 MED ORDER — HYDROCODONE-ACETAMINOPHEN 5-325 MG PO TABS: ORAL_TABLET | ORAL | Status: DC

## 2012-02-04 NOTE — ED Notes (Signed)
Pt reports waking from sleep around 3am with cp, midsternal that radiates to shoulders and back, sob at time, unable to get comfortable, denies any cough, fever. No nausea or vomiting.

## 2012-02-04 NOTE — ED Notes (Signed)
Patient with no complaints at this time. Respirations even and unlabored. Skin warm/dry. Discharge instructions reviewed with patient at this time. Patient given opportunity to voice concerns/ask questions. IV removed per policy and band-aid applied to site. Patient discharged at this time and left Emergency Department via wheelchair.  

## 2012-02-04 NOTE — ED Provider Notes (Signed)
History   This chart was scribed for Ward Givens, MD by Melba Coon, ED Scribe. The patient was seen in room APA01/APA01 and the patient's care was started at 11:02AM.    CSN: [id number]  Arrival date & time 02/04/12  1034   First MD Initiated Contact with Patient 02/04/12 1100      Chief Complaint  Patient presents with  . Chest Pain   Level 5 caveat for dementia  (Consider location/radiation/quality/duration/timing/severity/associated sxs/prior treatment) The history is provided by the patient. No language interpreter was used.   Tamara [name] is a 78 y.o. female who presents to the Emergency Department complaining of constant, moderate  sharp, central chest pain that radiates to her right shoulder blade and into her throat with associated SOB with an onset this morning around 3:30 AM. She reports the pain woke her from sleep this morning. Deep inhalation aggravates the pain. She reports she has never experienced this type of pain in the past. She denies anything that could have precipitated this pain. She did do housework yesterday but states she took frequent breaks.  She has had this pain before "When I was admitted in August for constipation and was really sick". Looking at her chart she was actually admitted for colitis.  Denies HA, cough, fever, rash, mild line back pain, wheezing, abdominal pain, nausea, emesis, diarrhea, constipation, abnormal BMs, dysuria, or extremity edema, weakness, numbness, or tingling. She c/o urinary frequency since she had a foley in August and now wears depends.  She has a Hx of a hysterectomy in the 1960's but no other abdominal surgeries noted.   She has a Hx of atrial fibrillation and is on coumadin. No known allergies. No other pertinent medical symptoms.  PCP: Dr Hughie Closs Cardiologist: Corinda Gubler  Past Medical History  Diagnosis Date  . Hypertension   . Atrial fibrillation     Onset well before 2004; Negative stress nuclear study in  01/2003  . Osteoporosis   . Acute colitis 09/2011    C. difficile negative  . Chronic anticoagulation   . Hyperlipidemia   . Hiatal hernia     by CT in 2013; also noted was an adrenal adenoma, duodenal lipoma right ovarian cyst, post hysterectomy  . Pneumonia 09/2011    09/2011    Past Surgical History  Procedure Date  . Abdominal hysterectomy  1963     No oophorectomy  . Appendectomy   . Colonoscopy 2005    Reportedly normal screening study    Family History  Problem Relation Age of Onset  . Heart attack Father   . Cancer Mother   . Hypertension Father     History  Substance Use Topics  . Smoking status: Never Smoker   . Smokeless tobacco: Never Used  . Alcohol Use: No  Lives at home Lives alone  OB History    Grav Para Term Preterm Abortions TAB SAB Ect Mult Living                  Review of Systems  Cardiovascular: Positive for chest pain.  10 Systems reviewed and all are negative for acute change except as noted in the HPI.    Allergies  Review of patient's allergies indicates no known allergies.  Home Medications   Current Outpatient Rx  Name  Route  Sig  Dispense  Refill  . ACETAMINOPHEN ER 650 MG PO TBCR   Oral   Take 650 mg by mouth daily as needed. For arthritis         .  ALENDRONATE SODIUM 70 MG PO TABS   Oral   Take 70 mg by mouth every 7 (seven) days. Take with a full glass of water on an empty stomach for the bones         . CETIRIZINE HCL 10 MG PO TABS   Oral   Take 10 mg by mouth daily. For allergies         . VITAMIN D PO   Oral   Take 1 tablet by mouth daily.         Marland Kitchen LEVOTHYROXINE SODIUM 88 MCG PO TABS   Oral   Take 88 mcg by mouth daily. For thyroid         . LISINOPRIL-HYDROCHLOROTHIAZIDE 20-25 MG PO TABS   Oral   Take 1 tablet by mouth daily.         Marland Kitchen MEMANTINE HCL 10 MG PO TABS   Oral   Take 10 mg by mouth 2 (two) times daily.         Marland Kitchen METOPROLOL TARTRATE 25 MG PO TABS   Oral   Take 1 tablet (25  mg total) by mouth 2 (two) times daily.   60 tablet   0   . OMEPRAZOLE 40 MG PO CPDR   Oral   Take 40 mg by mouth daily. For heartburn         . POTASSIUM CHLORIDE CRYS ER 20 MEQ PO TBCR   Oral   Take 20 mEq by mouth daily.         Marland Kitchen SACCHAROMYCES BOULARDII 250 MG PO CAPS   Oral   Take 250 mg by mouth 2 (two) times daily.         Marland Kitchen SIMVASTATIN 40 MG PO TABS   Oral   Take 40 mg by mouth at bedtime. For cholesterol         . WARFARIN SODIUM 5 MG PO TABS   Oral   Take 2.5-5 mg by mouth daily. Take 5 MG every day except Tuesdays; Take 2.5 MG on Tuesdays.           BP 152/65  Pulse 82  Temp 98.6 F (37 C) (Oral)  Resp 22  SpO2 98%  Vital signs normal mild hypertension   Physical Exam  Nursing note and vitals reviewed. Constitutional: She is oriented to person, place, and time.  Non-toxic appearance. She does not appear ill. She appears distressed.       Elderly frail  HENT:  Head: Normocephalic and atraumatic.  Right Ear: External ear normal.  Left Ear: External ear normal.  Nose: Nose normal. No mucosal edema or rhinorrhea.  Mouth/Throat: Oropharynx is clear and moist and mucous membranes are normal. No dental abscesses or uvula swelling.  Eyes: Conjunctivae normal and EOM are normal. Pupils are equal, round, and reactive to light.  Neck: Normal range of motion and full passive range of motion without pain. Neck supple.  Cardiovascular: Normal rate, regular rhythm and normal heart sounds.  Exam reveals no gallop and no friction rub.   No murmur heard. Pulmonary/Chest: Effort normal. No respiratory distress. She has no wheezes. She has no rhonchi. She has no rales. She exhibits no tenderness and no crepitus.       Grunting when she breathes deep. Decreased breath sounds. Decreased air movement with inspiration.  Abdominal: Soft. Normal appearance and bowel sounds are normal. She exhibits no distension. There is tenderness. There is no rebound and no guarding.  Epigastric and LUQ tenderness.  Musculoskeletal: Normal range of motion. She exhibits tenderness. She exhibits no edema.       TTP upper lumbar spine. Moves all extremities well.  Neurological: She is alert and oriented to person, place, and time. She has normal strength. No cranial nerve deficit.  Skin: Skin is warm, dry and intact. No rash noted. No erythema. There is pallor.  Psychiatric: She has a normal mood and affect. Her speech is normal and behavior is normal. Her mood appears not anxious.    ED Course  Procedures (including critical care time)   Medications  levofloxacin (LEVAQUIN) 500 MG tablet (not administered)  HYDROcodone-acetaminophen (NORCO/VICODIN) 5-325 MG per tablet (not administered)  morphine 4 MG/ML injection 4 mg (4 mg Intravenous Given 02/04/12 1122)  ondansetron (ZOFRAN) injection 4 mg (4 mg Intravenous Given 02/04/12 1120)  0.9 %  sodium chloride infusion (1000 mL Intravenous New Bag/Given 02/04/12 1122)     DIAGNOSTIC STUDIES: Oxygen Saturation is 98% on nasal canula (2L/min), adequate by my interpretation.    COORDINATION OF CARE:  11:11AM - morphine, zofran, EKG, CXR, and blood w/u with liver function tests, will be ordered for Norville Haggard.    12:33PM - recheck; she still denies coughing; pain is gone after pain meds.  Patient will be started on Levaquin which will treat both her respiratory and urinary tract infections. She will need close monitoring of her INR however because of interaction between Coumadin and the Levaquin.  Results for orders placed during the hospital encounter of 02/04/12  CBC WITH DIFFERENTIAL      Component Value Range   WBC 13.0 (*) 4.0 - 10.5 K/uL   RBC 5.06  3.87 - 5.11 MIL/uL   Hemoglobin 15.7 (*) 12.0 - 15.0 g/dL   HCT 16.1  09.6 - 04.5 %   MCV 90.7  78.0 - 100.0 fL   MCH 31.0  26.0 - 34.0 pg   MCHC 34.2  30.0 - 36.0 g/dL   RDW 40.9  81.1 - 91.4 %   Platelets 241  150 - 400 K/uL   Neutrophils  Relative 85 (*) 43 - 77 %   Neutro Abs 11.1 (*) 1.7 - 7.7 K/uL   Lymphocytes Relative 4 (*) 12 - 46 %   Lymphs Abs 0.5 (*) 0.7 - 4.0 K/uL   Monocytes Relative 11  3 - 12 %   Monocytes Absolute 1.4 (*) 0.1 - 1.0 K/uL   Eosinophils Relative 0  0 - 5 %   Eosinophils Absolute 0.0  0.0 - 0.7 K/uL   Basophils Relative 0  0 - 1 %   Basophils Absolute 0.0  0.0 - 0.1 K/uL  TROPONIN I      Component Value Range   Troponin I <0.30  <0.30 ng/mL  BASIC METABOLIC PANEL      Component Value Range   Sodium 136  135 - 145 mEq/L   Potassium 3.4 (*) 3.5 - 5.1 mEq/L   Chloride 97  96 - 112 mEq/L   CO2 29  19 - 32 mEq/L   Glucose, Bld 135 (*) 70 - 99 mg/dL   BUN 12  6 - 23 mg/dL   Creatinine, Ser 7.82  0.50 - 1.10 mg/dL   Calcium 95.6 (*) 8.4 - 10.5 mg/dL   GFR calc non Af Amer 78 (*) >90 mL/min   GFR calc Af Amer 90 (*) >90 mL/min  D-DIMER, QUANTITATIVE      Component Value Range   D-Dimer, Quant  0.30  0.00 - 0.48 ug/mL-FEU  PRO B NATRIURETIC PEPTIDE      Component Value Range   Pro B Natriuretic peptide (BNP) 772.1 (*) 0 - 450 pg/mL  APTT      Component Value Range   aPTT 34  24 - 37 seconds  PROTIME-INR      Component Value Range   Prothrombin Time 25.8 (*) 11.6 - 15.2 seconds   INR 2.50 (*) 0.00 - 1.49  HEPATIC FUNCTION PANEL      Component Value Range   Total Protein 7.5  6.0 - 8.3 g/dL   Albumin 4.0  3.5 - 5.2 g/dL   AST 18  0 - 37 U/L   ALT 17  0 - 35 U/L   Alkaline Phosphatase 79  39 - 117 U/L   Total Bilirubin 0.8  0.3 - 1.2 mg/dL   Bilirubin, Direct 0.2  0.0 - 0.3 mg/dL   Indirect Bilirubin 0.6  0.3 - 0.9 mg/dL  LIPASE, BLOOD      Component Value Range   Lipase 33  11 - 59 U/L  URINALYSIS, ROUTINE W REFLEX MICROSCOPIC      Component Value Range   Color, Urine YELLOW  YELLOW   APPearance CLEAR  CLEAR   Specific Gravity, Urine 1.025  1.005 - 1.030   pH 6.0  5.0 - 8.0   Glucose, UA NEGATIVE  NEGATIVE mg/dL   Hgb urine dipstick SMALL (*) NEGATIVE   Bilirubin Urine NEGATIVE   NEGATIVE   Ketones, ur NEGATIVE  NEGATIVE mg/dL   Protein, ur NEGATIVE  NEGATIVE mg/dL   Urobilinogen, UA 0.2  0.0 - 1.0 mg/dL   Nitrite NEGATIVE  NEGATIVE   Leukocytes, UA NEGATIVE  NEGATIVE  URINE MICROSCOPIC-ADD ON      Component Value Range   Squamous Epithelial / LPF FEW (*) RARE   WBC, UA 7-10  <3 WBC/hpf   RBC / HPF 3-6  <3 RBC/hpf   Bacteria, UA RARE  RARE    Laboratory interpretation all normal except therapeutic INR, leukocytosis, mild concentration of hemoglobin, borderline hypokalemia   Dg Chest Portable 1 View  02/04/2012  *RADIOLOGY REPORT*  Clinical Data: Chest pain, shortness of breath  PORTABLE CHEST - 1 VIEW  Comparison: 09/30/2011  Findings: Chronic interstitial markings/emphysematous changes. Mild patchy left basilar opacity, atelectasis versus pneumonia.  No pleural effusion or pneumothorax.  The heart is top normal in size.  IMPRESSION: Mild patchy left basilar opacity, atelectasis versus pneumonia.   Original Report Authenticated By: Charline Bills, M.D.      Date: 02/04/2012  Rate: 84  Rhythm: atrial flutter  QRS Axis: normal  Intervals: normal  ST/T Wave abnormalities: normal  Conduction Disutrbances:none  Narrative Interpretation: low voltage, PRWP, Q waves inferiorlyl  Old EKG Reviewed: changes noted was in afib with rate 59      1. Chest wall pain   2. Community acquired pneumonia   3. UTI (lower urinary tract infection)     New Prescriptions   HYDROCODONE-ACETAMINOPHEN (NORCO/VICODIN) 5-325 MG PER TABLET    Take 1 or 2 po Q 6hrs for pain   LEVOFLOXACIN (LEVAQUIN) 500 MG TABLET    Take 1 tablet (500 mg total) by mouth daily.    Plan discharge  Devoria Albe, MD, FACEP   MDM   I personally performed the services described in this documentation, which was scribed in my presence. The recorded information has been reviewed and considered.   Devoria Albe, MD, Armando Gang  Ward Givens, MD 02/04/12 1349

## 2012-02-09 ENCOUNTER — Ambulatory Visit (HOSPITAL_COMMUNITY)
Admission: RE | Admit: 2012-02-09 | Discharge: 2012-02-09 | Disposition: A | Discharge status: Home / Self Care | Payer: Medicare Other | Source: Ambulatory Visit | Attending: *Deleted | Admitting: *Deleted

## 2012-02-09 ENCOUNTER — Other Ambulatory Visit (HOSPITAL_COMMUNITY): Payer: Self-pay | Admitting: *Deleted

## 2012-02-09 DIAGNOSIS — R079 Chest pain, unspecified: Secondary | ICD-10-CM | POA: Insufficient documentation

## 2012-02-09 DIAGNOSIS — R0602 Shortness of breath: Secondary | ICD-10-CM | POA: Insufficient documentation

## 2012-02-09 DIAGNOSIS — J90 Pleural effusion, not elsewhere classified: Secondary | ICD-10-CM | POA: Insufficient documentation

## 2012-02-09 DIAGNOSIS — J189 Pneumonia, unspecified organism: Secondary | ICD-10-CM

## 2012-02-09 MED ORDER — IOHEXOL 350 MG/ML SOLN
100.0000 mL | Freq: Once | INTRAVENOUS | Status: AC | PRN
  Administered 2012-02-09: 100 mL via INTRAVENOUS

## 2012-02-16 ENCOUNTER — Ambulatory Visit (INDEPENDENT_AMBULATORY_CARE_PROVIDER_SITE_OTHER): Payer: Medicare Other | Admitting: Adult Health

## 2012-02-16 ENCOUNTER — Encounter: Payer: Self-pay | Admitting: *Deleted

## 2012-02-16 ENCOUNTER — Encounter: Payer: Self-pay | Admitting: Adult Health

## 2012-02-16 VITALS — BP 108/66 | HR 121 | Ht 70.0 in | Wt 155.0 lb

## 2012-02-16 DIAGNOSIS — R0789 Other chest pain: Secondary | ICD-10-CM

## 2012-02-16 DIAGNOSIS — I10 Essential (primary) hypertension: Secondary | ICD-10-CM

## 2012-02-16 DIAGNOSIS — E785 Hyperlipidemia, unspecified: Secondary | ICD-10-CM

## 2012-02-16 DIAGNOSIS — I4891 Unspecified atrial fibrillation: Secondary | ICD-10-CM

## 2012-02-16 MED ORDER — DIGOXIN 125 MCG PO TABS: 0.1250 mg | ORAL_TABLET | Freq: Every day | ORAL | Status: DC

## 2012-02-16 NOTE — Patient Instructions (Addendum)
Your physician recommends that you schedule a follow-up appointment in: POST TESTING WITH KL/RR  Your physician has requested that you have an echocardiogram. Echocardiography is a painless test that uses sound waves to create images of your heart. It provides your doctor with information about the size and shape of your heart and how well your heart's chambers and valves are working. This procedure takes approximately one hour. There are no restrictions for this procedure.  YOUR PHYSICIAN RECOMMENDS YOU HAVE A LEXISCAN STRESS TEST  Your physician has recommended you make the following change in your medication:   1) START DIGOXIN 0.125MG  DAILY

## 2012-02-16 NOTE — Assessment & Plan Note (Signed)
Unclear of etiology at this point. She was ruled out for MI in ER.   However she continues to have this at rest. EKG shows nonspecific T-wave abnormalities. I will plan a lexiscan nuclear study for evaluation of ischemia causing discomfort. Cannot rule out unstable angina. Echocardiogram for LV fx and pulmonary pressures will be completed. She is on CCB, which will assist with vasospasms and questionable pulmonary hypertension, although this has not been diagnosed. She was very active until this past August when she had a severe illness requiring a lengthy hospitalization.

## 2012-02-16 NOTE — Progress Notes (Deleted)
Name: Tamara [name]    DOB: 1924-10-01  Age: 78 y.o.  MR#: [id number]       PCP:  Dwana Melena, MD      Insurance: @PAYORNAME @   CC:   No chief complaint on file.   VS BP 108/66  Pulse 121  Ht 5\' 10"  (1.778 m)  Wt 155 lb (70.308 kg)  BMI 22.24 kg/m2  SpO2 95%  Weights Current Weight  02/16/12 155 lb (70.308 kg)  10/28/11 151 lb (68.493 kg)  10/04/11 157 lb 10.1 oz (71.5 kg)    Blood Pressure  BP Readings from Last 3 Encounters:  02/16/12 108/66  02/04/12 118/57  10/28/11 116/79     Admit date:  (Not on file) Last encounter with RMR:  Visit date not found   Allergy No Known Allergies  Current Outpatient Prescriptions  Medication Sig Dispense Refill  . alendronate (FOSAMAX) 70 MG tablet Take 70 mg by mouth every 7 (seven) days. Takes on Sunday . Take with a full glass of water on an empty stomach for the bones      . levothyroxine (SYNTHROID, LEVOTHROID) 125 MCG tablet Take 125 mcg by mouth daily.      Marland Kitchen lisinopril-hydrochlorothiazide (PRINZIDE,ZESTORETIC) 20-25 MG per tablet Take 1 tablet by mouth daily.      . Magnesium 250 MG TABS Take 250 mg by mouth daily.      . memantine (NAMENDA) 10 MG tablet Take 10 mg by mouth 2 (two) times daily.      Marland Kitchen omeprazole (PRILOSEC) 40 MG capsule Take 40 mg by mouth daily. For heartburn      . potassium chloride SA (K-DUR,KLOR-CON) 20 MEQ tablet Take 20 mEq by mouth daily.      . simvastatin (ZOCOR) 20 MG tablet Take 40 mg by mouth every evening.       . traMADol (ULTRAM) 50 MG tablet Take 50 mg by mouth at bedtime.      . verapamil (CALAN-SR) 240 MG CR tablet Take 240 mg by mouth 2 (two) times daily.      Marland Kitchen warfarin (COUMADIN) 5 MG tablet Take 2.5-5 mg by mouth daily. Take 5 MG every day except Tuesdays; Take 2.5 MG on Tuesdays.        Discontinued Meds:    Medications Discontinued During This Encounter  Medication Reason  . HYDROcodone-acetaminophen (NORCO/VICODIN) 5-325 MG per tablet Error  . levofloxacin (LEVAQUIN) 500 MG  tablet Error  . Calcium Carbonate-Vitamin D (CALCIUM 600 + D PO) Error  . metoprolol tartrate (LOPRESSOR) 25 MG tablet Error    Patient Active Problem List  Diagnosis  . Atrial fibrillation  . Hypertension  . Chronic anticoagulation  . Hyperlipidemia    LABS Admission on 02/04/2012, Discharged on 02/04/2012  Component Date Value  . WBC 02/04/2012 13.0*  . RBC 02/04/2012 5.06   . Hemoglobin 02/04/2012 15.7*  . HCT 02/04/2012 45.9   . MCV 02/04/2012 90.7   . [facility] 02/04/2012 31.0   . MCHC 02/04/2012 34.2   . RDW 02/04/2012 13.4   . Platelets 02/04/2012 241   . Neutrophils Relative 02/04/2012 85*  . Neutro Abs 02/04/2012 11.1*  . Lymphocytes Relative 02/04/2012 4*  . Lymphs Abs 02/04/2012 0.5*  . Monocytes Relative 02/04/2012 11   . Monocytes Absolute 02/04/2012 1.4*  . Eosinophils Relative 02/04/2012 0   . Eosinophils Absolute 02/04/2012 0.0   . Basophils Relative 02/04/2012 0   . Basophils Absolute 02/04/2012 0.0   . Troponin I 02/04/2012 <0.30   .  Sodium 02/04/2012 136   . Potassium 02/04/2012 3.4*  . Chloride 02/04/2012 97   . CO2 02/04/2012 29   . Glucose, Bld 02/04/2012 135*  . BUN 02/04/2012 12   . Creatinine, Ser 02/04/2012 0.68   . Calcium 02/04/2012 10.6*  . GFR calc non Af Amer 02/04/2012 78*  . GFR calc Af Amer 02/04/2012 90*  . D-Dimer, Quant 02/04/2012 0.30   . Pro B Natriuretic peptid* 02/04/2012 772.1*  . aPTT 02/04/2012 34   . Prothrombin Time 02/04/2012 25.8*  . INR 02/04/2012 2.50*  . Total Protein 02/04/2012 7.5   . Albumin 02/04/2012 4.0   . AST 02/04/2012 18   . ALT 02/04/2012 17   . Alkaline Phosphatase 02/04/2012 79   . Total Bilirubin 02/04/2012 0.8   . Bilirubin, Direct 02/04/2012 0.2   . Indirect Bilirubin 02/04/2012 0.6   . Lipase 02/04/2012 33   . Color, Urine 02/04/2012 YELLOW   . APPearance 02/04/2012 CLEAR   . Specific Gravity, Urine 02/04/2012 1.025   . pH 02/04/2012 6.0   . Glucose, UA 02/04/2012 NEGATIVE   . Hgb urine  dipstick 02/04/2012 SMALL*  . Bilirubin Urine 02/04/2012 NEGATIVE   . Ketones, ur 02/04/2012 NEGATIVE   . Protein, ur 02/04/2012 NEGATIVE   . Urobilinogen, UA 02/04/2012 0.2   . Nitrite 02/04/2012 NEGATIVE   . Leukocytes, UA 02/04/2012 NEGATIVE   . Squamous Epithelial / LPF 02/04/2012 FEW*  . WBC, UA 02/04/2012 7-10   . RBC / HPF 02/04/2012 3-6   . Bacteria, UA 02/04/2012 RARE      Results for this Opt Visit:     Results for orders placed during the hospital encounter of 02/04/12  CBC WITH DIFFERENTIAL      Component Value Range   WBC 13.0 (*) 4.0 - 10.5 K/uL   RBC 5.06  3.87 - 5.11 MIL/uL   Hemoglobin 15.7 (*) 12.0 - 15.0 g/dL   HCT 81.1  91.4 - 78.2 %   MCV 90.7  78.0 - 100.0 fL   MCH 31.0  26.0 - 34.0 pg   MCHC 34.2  30.0 - 36.0 g/dL   RDW 95.6  21.3 - 08.6 %   Platelets 241  150 - 400 K/uL   Neutrophils Relative 85 (*) 43 - 77 %   Neutro Abs 11.1 (*) 1.7 - 7.7 K/uL   Lymphocytes Relative 4 (*) 12 - 46 %   Lymphs Abs 0.5 (*) 0.7 - 4.0 K/uL   Monocytes Relative 11  3 - 12 %   Monocytes Absolute 1.4 (*) 0.1 - 1.0 K/uL   Eosinophils Relative 0  0 - 5 %   Eosinophils Absolute 0.0  0.0 - 0.7 K/uL   Basophils Relative 0  0 - 1 %   Basophils Absolute 0.0  0.0 - 0.1 K/uL  TROPONIN I      Component Value Range   Troponin I <0.30  <0.30 ng/mL  BASIC METABOLIC PANEL      Component Value Range   Sodium 136  135 - 145 mEq/L   Potassium 3.4 (*) 3.5 - 5.1 mEq/L   Chloride 97  96 - 112 mEq/L   CO2 29  19 - 32 mEq/L   Glucose, Bld 135 (*) 70 - 99 mg/dL   BUN 12  6 - 23 mg/dL   Creatinine, Ser 5.78  0.50 - 1.10 mg/dL   Calcium 46.9 (*) 8.4 - 10.5 mg/dL   GFR calc non Af Amer 78 (*) >90 mL/min  GFR calc Af Amer 90 (*) >90 mL/min  D-DIMER, QUANTITATIVE      Component Value Range   D-Dimer, Quant 0.30  0.00 - 0.48 ug/mL-FEU  PRO B NATRIURETIC PEPTIDE      Component Value Range   Pro B Natriuretic peptide (BNP) 772.1 (*) 0 - 450 pg/mL  APTT      Component Value Range   aPTT  34  24 - 37 seconds  PROTIME-INR      Component Value Range   Prothrombin Time 25.8 (*) 11.6 - 15.2 seconds   INR 2.50 (*) 0.00 - 1.49  HEPATIC FUNCTION PANEL      Component Value Range   Total Protein 7.5  6.0 - 8.3 g/dL   Albumin 4.0  3.5 - 5.2 g/dL   AST 18  0 - 37 U/L   ALT 17  0 - 35 U/L   Alkaline Phosphatase 79  39 - 117 U/L   Total Bilirubin 0.8  0.3 - 1.2 mg/dL   Bilirubin, Direct 0.2  0.0 - 0.3 mg/dL   Indirect Bilirubin 0.6  0.3 - 0.9 mg/dL  LIPASE, BLOOD      Component Value Range   Lipase 33  11 - 59 U/L  URINALYSIS, ROUTINE W REFLEX MICROSCOPIC      Component Value Range   Color, Urine YELLOW  YELLOW   APPearance CLEAR  CLEAR   Specific Gravity, Urine 1.025  1.005 - 1.030   pH 6.0  5.0 - 8.0   Glucose, UA NEGATIVE  NEGATIVE mg/dL   Hgb urine dipstick SMALL (*) NEGATIVE   Bilirubin Urine NEGATIVE  NEGATIVE   Ketones, ur NEGATIVE  NEGATIVE mg/dL   Protein, ur NEGATIVE  NEGATIVE mg/dL   Urobilinogen, UA 0.2  0.0 - 1.0 mg/dL   Nitrite NEGATIVE  NEGATIVE   Leukocytes, UA NEGATIVE  NEGATIVE  URINE MICROSCOPIC-ADD ON      Component Value Range   Squamous Epithelial / LPF FEW (*) RARE   WBC, UA 7-10  <3 WBC/hpf   RBC / HPF 3-6  <3 RBC/hpf   Bacteria, UA RARE  RARE    EKG Orders placed in visit on 02/16/12  . EKG 12-LEAD     Prior Assessment and Plan Problem List as of 02/16/2012          Atrial fibrillation   Last Assessment & Plan Note   10/28/2011 Office Visit Signed 10/28/2011  2:58 PM by Kathlen Brunswick, MD    Patient appears to be asymptomatic with respect to atrial fibrillation.  Heart rate is well controlled at rest.  She has no symptoms related to exertion.  The strategy of rate control and anticoagulation with warfarin has been effective for many years and will be continued.    Hypertension   Last Assessment & Plan Note   10/28/2011 Office Visit Signed 10/28/2011  3:01 PM by Kathlen Brunswick, MD    No blood pressures in excess of 150 systolic or 90  diastolic had been recorded.  Current therapy will be continued.  Hypokalemia present during recent hospital admission, likely as a result of GI problems, but diuretic may have been contributing.  A repeat chemistry profile will be obtained.    Chronic anticoagulation   Last Assessment & Plan Note   10/28/2011 Office Visit Signed 10/28/2011  2:59 PM by Kathlen Brunswick, MD    Patient has done well with long-term warfarin treatment.  We discussed alternative oral agents, but she wishes to continue  current therapy.  We will monitor for occult GI blood loss.    Hyperlipidemia   Last Assessment & Plan Note   10/28/2011 Office Visit Signed 10/28/2011  2:59 PM by Kathlen Brunswick, MD    No recent lipid profile available.  Due to advanced age and absence of vascular disease, careful monitoring of hyperlipidemia does not appear absolutely necessary.        Imaging: Ct Angio Chest Pe W/cm &/or Wo Cm  02/09/2012  *RADIOLOGY REPORT*  Clinical Data: Chest pain.  Shortness of breath.  CT ANGIOGRAPHY CHEST  Technique:  Multidetector CT imaging of the chest using the standard protocol during bolus administration of intravenous contrast. Multiplanar reconstructed images including MIPs were obtained and reviewed to evaluate the vascular anatomy.  Contrast: OMNIPAQUE IOHEXOL 350 MG/ML SOLN none  Comparison: None.  Findings: The chest wall is unremarkable.  No breast masses, supraclavicular or axillary mass or adenopathy.  The thyroid gland is grossly normal.  The bony thorax is intact.  No destructive bone lesions or spinal canal compromise.  Degenerative changes noted in the thoracic spine. Mild sclerotic change and possible remote superior endplate fracture of the L1 vertebral body.  The heart is normal in size.  No pericardial effusion.  No mediastinal or hilar lymphadenopathy.  Small scattered lymph nodes are noted.  The aorta is normal in caliber.  No dissection. Coronary artery calcifications are noted.  The  esophagus is grossly normal.  The pulmonary arterial tree is well opacified.  No filling defects to suggest pulmonary emboli.  Examination of the lung parenchyma demonstrates no acute pulmonary findings.  No infiltrates or edema.  There are small bilateral pleural effusions with overlying atelectasis.  The upper abdomen is unremarkable.  There is a benign appearing right adrenal gland adenoma.  IMPRESSION:  1.  No CT findings for pulmonary embolism. 2. Small bilateral pleural effusions and bibasilar atelectasis. 3.  Mild atherosclerotic changes involving the aorta but no focal aneurysm or dissection.   Original Report Authenticated By: Rudie Meyer, M.D.    Dg Chest Portable 1 View  02/04/2012  *RADIOLOGY REPORT*  Clinical Data: Chest pain, shortness of breath  PORTABLE CHEST - 1 VIEW  Comparison: 09/30/2011  Findings: Chronic interstitial markings/emphysematous changes. Mild patchy left basilar opacity, atelectasis versus pneumonia.  No pleural effusion or pneumothorax.  The heart is top normal in size.  IMPRESSION: Mild patchy left basilar opacity, atelectasis versus pneumonia.   Original Report Authenticated By: Charline Bills, M.D.      [facility] Calculation: Score not calculated

## 2012-02-16 NOTE — Assessment & Plan Note (Addendum)
Remains on verapamil. BP low normal today. Will not increase dose for better HR control due to soft BP. Can consider decreasing the HCTZ portion of lisinopril combo if BP remains low. As she is in atrial fibrillation, accuracy of BP readings is in question.

## 2012-02-16 NOTE — Progress Notes (Signed)
HPI: Tamara [name] is a delightful patient of Dr. Dietrich Pates we are following for ongoing assessment and treatment of  Atrial fibrillation,coumadin followed by Dr. Margo Aye, and  Hypertension. She comes today at the request of Dr. Margo Aye for cardiac recommendations for recurrent chest discomfort at rest.    She was awakened with severe chest discomfort on 02/04/2012 and presented to ER. She was negative for MI, CT scan was negative for PE (low likelihood on coumadin), but was found to be mildly hypokalemic with K of 3.4, with follow up magnesium per Dr. Margo Aye of 1.4. She has been placed on replacement daily. She was not found to be anemic, Hgb 15.7, Hct of 45.9. She was placed on hydrocodone by ER and had been taking it excessively for recurrent pain. She became slightly confused by the medication and was taken off of this by Dr. Margo Aye, along with tramadol. She takes tylenol for pain now. She has a history of arthritis, but states the pain is different from this. Her daughter in law, who is her main caretaker, states that the patient has been having DOE which is unusual for her and has been more tired. The patient states that its because she is older in age and she is not worried about it.    She continues to have intermittent chest discomfort at rest, with no history of CAD. She has had a stress test in the remote past by former cardiologist, Dr.Franklin in [place], [place]. She was hospitalized at [facility] in August of 2013 for acute colitis and severe hypotension for approximately 2 weeks in ICU. Her daughter in law states that she has been declining in her energy ever since.  No Known Allergies  Current Outpatient Prescriptions  Medication Sig Dispense Refill  . alendronate (FOSAMAX) 70 MG tablet Take 70 mg by mouth every 7 (seven) days. Takes on Sunday . Take with a full glass of water on an empty stomach for the bones      . levothyroxine (SYNTHROID, LEVOTHROID) 125 MCG tablet Take 125 mcg by mouth daily.        Marland Kitchen lisinopril-hydrochlorothiazide (PRINZIDE,ZESTORETIC) 20-25 MG per tablet Take 1 tablet by mouth daily.      . Magnesium 250 MG TABS Take 250 mg by mouth daily.      . memantine (NAMENDA) 10 MG tablet Take 10 mg by mouth 2 (two) times daily.      Marland Kitchen omeprazole (PRILOSEC) 40 MG capsule Take 40 mg by mouth daily. For heartburn      . potassium chloride SA (K-DUR,KLOR-CON) 20 MEQ tablet Take 20 mEq by mouth daily.      . simvastatin (ZOCOR) 20 MG tablet Take 40 mg by mouth every evening.       . traMADol (ULTRAM) 50 MG tablet Take 50 mg by mouth at bedtime.      . verapamil (CALAN-SR) 240 MG CR tablet Take 240 mg by mouth 2 (two) times daily.      Marland Kitchen warfarin (COUMADIN) 5 MG tablet Take 2.5-5 mg by mouth daily. Take 5 MG every day except Tuesdays; Take 2.5 MG on Tuesdays.      . digoxin (LANOXIN) 0.125 MG tablet Take 1 tablet (0.125 mg total) by mouth daily.  90 tablet  0    Past Medical History  Diagnosis Date  . Hypertension   . Atrial fibrillation     Onset well before 2004; Negative stress nuclear study in 01/2003  . Osteoporosis   . Acute colitis 09/2011  C. difficile negative  . Chronic anticoagulation   . Hyperlipidemia   . Hiatal hernia     by CT in 2013; also noted was an adrenal adenoma, duodenal lipoma right ovarian cyst, post hysterectomy  . Pneumonia 09/2011    09/2011    Past Surgical History  Procedure Date  . Abdominal hysterectomy     No oophorectomy  . Appendectomy   . Colonoscopy 2005    Reportedly normal screening study    ZOX:[id number] of systems complete and found to be negative unless listed above  PHYSICAL EXAM BP 108/66  Pulse 121  Ht 5\' 10"  (1.778 m)  Wt 155 lb (70.308 kg)  BMI 22.24 kg/m2  SpO2 95%  General: Well developed, well nourished, in no acute distress Head: Eyes PERRLA, No xanthomas.   Normal cephalic and atramatic  Lungs: Clear bilaterally to auscultation and percussion. Heart: HRIR S1 S2,tachycardic, with 1/6 systolic murmur.  Pulses  are 2+ & equal.            No carotid bruit. No JVD.  No abdominal bruits. No femoral bruits. Abdomen: Bowel sounds are positive, abdomen soft and non-tender without masses or                  Hernia's noted. Msk:  Back with mild kyphosis, normal gait. Normal strength and tone for age. Extremities: No clubbing, cyanosis or edema.  DP +1 Neuro: Alert and oriented X 3. Psych:  Good affect, responds appropriately  EKG: Atrial fibrillation rate of 85 bpm  ASSESSMENT AND PLAN

## 2012-02-16 NOTE — Assessment & Plan Note (Signed)
Her heart rate is slightly tachycardic on EKG but higher on initial assessment vital signs at 121 bpm. On my exam with position change, HR is elevated at 115 bpm to auscultation. She is short of breath with position changes. She has a low likelihood of PE as she is on coumadin and therefore do not believe elevated HR or dyspnea is related to this.   I will begin low dose of digoxin for better HR control and hopefully assist her with CO and breathing status. She will continue on coumadin with INR and dosing per Dr. Margo Aye. EKG on follow up.

## 2012-02-24 ENCOUNTER — Encounter (HOSPITAL_COMMUNITY): Admission: RE | Admit: 2012-02-24 | Payer: Medicare PPO | Source: Ambulatory Visit

## 2012-02-24 ENCOUNTER — Other Ambulatory Visit (HOSPITAL_COMMUNITY): Payer: Medicare Other

## 2012-02-24 ENCOUNTER — Ambulatory Visit (HOSPITAL_COMMUNITY): Admission: RE | Admit: 2012-02-24 | Payer: Medicare PPO | Source: Ambulatory Visit

## 2012-02-24 ENCOUNTER — Ambulatory Visit (HOSPITAL_COMMUNITY)
Admission: RE | Admit: 2012-02-24 | Discharge: 2012-02-24 | Disposition: A | Discharge status: Home / Self Care | Payer: Medicare PPO | Source: Ambulatory Visit | Attending: Adult Health | Admitting: Adult Health

## 2012-02-24 ENCOUNTER — Encounter (HOSPITAL_COMMUNITY)
Admission: RE | Admit: 2012-02-24 | Discharge: 2012-02-24 | Disposition: A | Discharge status: Home / Self Care | Payer: Medicare PPO | Source: Ambulatory Visit | Attending: Adult Health | Admitting: Adult Health

## 2012-02-24 DIAGNOSIS — I4891 Unspecified atrial fibrillation: Secondary | ICD-10-CM | POA: Insufficient documentation

## 2012-02-24 DIAGNOSIS — I319 Disease of pericardium, unspecified: Secondary | ICD-10-CM

## 2012-02-24 DIAGNOSIS — E785 Hyperlipidemia, unspecified: Secondary | ICD-10-CM

## 2012-02-24 DIAGNOSIS — R0789 Other chest pain: Secondary | ICD-10-CM | POA: Insufficient documentation

## 2012-02-24 DIAGNOSIS — R42 Dizziness and giddiness: Secondary | ICD-10-CM | POA: Insufficient documentation

## 2012-02-24 DIAGNOSIS — I10 Essential (primary) hypertension: Secondary | ICD-10-CM | POA: Insufficient documentation

## 2012-02-24 DIAGNOSIS — R079 Chest pain, unspecified: Secondary | ICD-10-CM | POA: Insufficient documentation

## 2012-02-24 NOTE — Progress Notes (Signed)
*  PRELIMINARY RESULTS* Echocardiogram 2D Echocardiogram has been performed.  Tamara [name] 02/24/2012, 11:02 AM

## 2012-02-25 ENCOUNTER — Emergency Department (HOSPITAL_COMMUNITY): Payer: Medicare PPO

## 2012-02-25 ENCOUNTER — Inpatient Hospital Stay (HOSPITAL_COMMUNITY)
Admission: EM | Admit: 2012-02-25 | Discharge: 2012-02-29 | DRG: 312 | Disposition: A | Discharge status: Skilled Nursing Facility | Payer: Medicare PPO | Attending: Cardiology | Admitting: Cardiology

## 2012-02-25 ENCOUNTER — Other Ambulatory Visit: Payer: Self-pay

## 2012-02-25 ENCOUNTER — Encounter (HOSPITAL_COMMUNITY): Payer: Self-pay | Admitting: *Deleted

## 2012-02-25 DIAGNOSIS — K219 Gastro-esophageal reflux disease without esophagitis: Secondary | ICD-10-CM | POA: Diagnosis present

## 2012-02-25 DIAGNOSIS — F0390 Unspecified dementia without behavioral disturbance: Secondary | ICD-10-CM | POA: Diagnosis present

## 2012-02-25 DIAGNOSIS — R5383 Other fatigue: Secondary | ICD-10-CM

## 2012-02-25 DIAGNOSIS — Z7901 Long term (current) use of anticoagulants: Secondary | ICD-10-CM

## 2012-02-25 DIAGNOSIS — Z79899 Other long term (current) drug therapy: Secondary | ICD-10-CM

## 2012-02-25 DIAGNOSIS — E871 Hypo-osmolality and hyponatremia: Secondary | ICD-10-CM | POA: Diagnosis present

## 2012-02-25 DIAGNOSIS — R55 Syncope and collapse: Principal | ICD-10-CM | POA: Diagnosis present

## 2012-02-25 DIAGNOSIS — I498 Other specified cardiac arrhythmias: Secondary | ICD-10-CM | POA: Diagnosis present

## 2012-02-25 DIAGNOSIS — I309 Acute pericarditis, unspecified: Secondary | ICD-10-CM | POA: Diagnosis present

## 2012-02-25 DIAGNOSIS — R001 Bradycardia, unspecified: Secondary | ICD-10-CM

## 2012-02-25 DIAGNOSIS — R531 Weakness: Secondary | ICD-10-CM | POA: Diagnosis present

## 2012-02-25 DIAGNOSIS — I10 Essential (primary) hypertension: Secondary | ICD-10-CM | POA: Diagnosis present

## 2012-02-25 DIAGNOSIS — I313 Pericardial effusion (noninflammatory): Secondary | ICD-10-CM

## 2012-02-25 DIAGNOSIS — M810 Age-related osteoporosis without current pathological fracture: Secondary | ICD-10-CM | POA: Diagnosis present

## 2012-02-25 DIAGNOSIS — I4891 Unspecified atrial fibrillation: Secondary | ICD-10-CM | POA: Diagnosis present

## 2012-02-25 LAB — HEPATIC FUNCTION PANEL
Albumin: 2.8 g/dL — ABNORMAL LOW (ref 3.5–5.2)
Bilirubin, Direct: 0.1 mg/dL (ref 0.0–0.3)
Indirect Bilirubin: 0.2 mg/dL — ABNORMAL LOW (ref 0.3–0.9)
Total Bilirubin: 0.3 mg/dL (ref 0.3–1.2)

## 2012-02-25 LAB — URINALYSIS, ROUTINE W REFLEX MICROSCOPIC
Ketones, ur: NEGATIVE mg/dL
Leukocytes, UA: NEGATIVE
Nitrite: NEGATIVE
Protein, ur: NEGATIVE mg/dL
Urobilinogen, UA: 0.2 mg/dL (ref 0.0–1.0)
pH: 6 (ref 5.0–8.0)

## 2012-02-25 LAB — PROTIME-INR
INR: 2.72 — ABNORMAL HIGH (ref 0.00–1.49)
Prothrombin Time: 27.5 seconds — ABNORMAL HIGH (ref 11.6–15.2)

## 2012-02-25 LAB — CBC WITH DIFFERENTIAL/PLATELET
Basophils Absolute: 0 10*3/uL (ref 0.0–0.1)
Eosinophils Relative: 1 % (ref 0–5)
Lymphocytes Relative: 10 % — ABNORMAL LOW (ref 12–46)
Neutro Abs: 7.7 10*3/uL (ref 1.7–7.7)
Platelets: 323 10*3/uL (ref 150–400)
RDW: 13.6 % (ref 11.5–15.5)
WBC: 9.9 10*3/uL (ref 4.0–10.5)

## 2012-02-25 LAB — LIPASE, BLOOD: Lipase: 50 U/L (ref 11–59)

## 2012-02-25 LAB — BASIC METABOLIC PANEL
CO2: 29 mEq/L (ref 19–32)
Calcium: 10.7 mg/dL — ABNORMAL HIGH (ref 8.4–10.5)
Sodium: 130 mEq/L — ABNORMAL LOW (ref 135–145)

## 2012-02-25 MED ORDER — SODIUM CHLORIDE 0.9 % IV BOLUS (SEPSIS)
250.0000 mL | Freq: Once | INTRAVENOUS | Status: AC
  Administered 2012-02-25: 250 mL via INTRAVENOUS

## 2012-02-25 MED ORDER — SODIUM CHLORIDE 0.9 % IV SOLN
INTRAVENOUS | Status: DC
  Administered 2012-02-25: 17:00:00 via INTRAVENOUS

## 2012-02-25 MED ORDER — SODIUM CHLORIDE 0.9 % IV SOLN: INTRAVENOUS | Status: DC

## 2012-02-25 NOTE — Consult Note (Signed)
[facility] Medical Consultation  PRUE LINGENFELTER WUJ:[id number] DOB: 1924-05-26 DOA: 02/25/2012 DEATRA MCMAHEN is an 79 y.o. female.    PCP: Dwana Melena, MD     Requesting physician: ED physician, Dr. Deretha Emory Date of consultation: On the date of this dictation Reason for consultation: Generalized weakness  Chief Complaint: Weakness for the last 5 days  HPI: This is a 79 year old, Caucasian female, with a past medical history of dementia, atrial fibrillation, hypertension, osteoporosis, GERD, who was in her usual state of health till December 14, when she had chest pain, and presented to the emergency department. She was discharged from the ED, and followed up with, Dr. Dietrich Pates. She saw him on December 26. She was found to be tachycardic during that assessment. Patient was started on digoxin for better heart rate control. Regarding her chest pain there was a plan to do the nuclear stress test in the near future. An echocardiogram was also ordered. Patient was admitted in August of 2013 for sepsis and hypotension secondary to colitis. She was also encephalopathic during that hospitalization. So essentially patient represents today with weakness that has been getting worse over the last 5-6 days. She thinks that Digoxin may be playing a role. However, she also had an echocardiogram done on January 3, which revealed a moderate to large pericardial effusion. Patient tells me that she was in her usual state of health yesterday and dealing with her weakness when she thinks that she may have almost passed out. She denies any head injuries. She did hit left elbow as well as left hip, but has been able to ambulate. She was standing when this happened. Has also been short of breath, but that's not any worse. Has been having chest pain, occasionally. Denies any nausea, vomiting. Daughter-in-law is at the, bedside. There has been no sick contacts. Denies any leg swelling. Overall patient is very  concerned about her lack of energy, which is very unusual for her.   Home Medications: Prior to Admission medications   Medication Sig Start Date End Date Taking? Authorizing Provider  digoxin (LANOXIN) 0.125 MG tablet Take 1 tablet (0.125 mg total) by mouth daily. 02/16/12  Yes Jodelle Gross, NP  levothyroxine (SYNTHROID, LEVOTHROID) 125 MCG tablet Take 125 mcg by mouth daily.   Yes Historical Provider, MD  lisinopril-hydrochlorothiazide (PRINZIDE,ZESTORETIC) 20-25 MG per tablet Take 1 tablet by mouth daily.   Yes Historical Provider, MD  Magnesium 250 MG TABS Take 250 mg by mouth daily.   Yes Historical Provider, MD  memantine (NAMENDA) 10 MG tablet Take 10 mg by mouth 2 (two) times daily.   Yes Historical Provider, MD  omeprazole (PRILOSEC) 40 MG capsule Take 40 mg by mouth daily. For heartburn   Yes Historical Provider, MD  potassium chloride SA (K-DUR,KLOR-CON) 20 MEQ tablet Take 20 mEq by mouth daily.   Yes Historical Provider, MD  simvastatin (ZOCOR) 20 MG tablet Take 40 mg by mouth every evening.    Yes Historical Provider, MD  traMADol (ULTRAM) 50 MG tablet Take 50 mg by mouth at bedtime.   Yes Historical Provider, MD  verapamil (CALAN-SR) 240 MG CR tablet Take 240 mg by mouth 2 (two) times daily.   Yes Historical Provider, MD  warfarin (COUMADIN) 5 MG tablet Take 2.5-5 mg by mouth daily. Take 5mg  on Mon.,Wed.,Fri.,Sat.,Sun., and 2.5mg  on Tues.Clovis Cao.,   Yes Historical Provider, MD  alendronate (FOSAMAX) 70 MG tablet Take 70 mg by mouth every 7 (seven) days. Takes on Sunday . Take with  a full glass of water on an empty stomach for the bones    Historical Provider, MD    Allergies: No Known Allergies  Past Medical History: Past Medical History  Diagnosis Date  . Hypertension   . Atrial fibrillation     Onset well before 2004; Negative stress nuclear study in 01/2003  . Osteoporosis   . Acute colitis 09/2011    C. difficile negative  . Chronic anticoagulation   .  Hyperlipidemia   . Hiatal hernia     by CT in 2013; also noted was an adrenal adenoma, duodenal lipoma right ovarian cyst, post hysterectomy  . Pneumonia 09/2011    09/2011    Past Surgical History  Procedure Date  . Abdominal hysterectomy     No oophorectomy  . Appendectomy   . Colonoscopy 2005    Reportedly normal screening study    Social History:   reports that she has never smoked. She has never used smokeless tobacco. She reports that she does not drink alcohol or use illicit drugs.  Living Situation: Lives by herself Activity Level: Usually quite independent with daily activities   Family History:  Family History  Problem Relation Age of Onset  . Heart attack Father   . Cancer Mother   . Hypertension Father     Review of Systems - History obtained from the patient General ROS: positive for  - fatigue and malaise Psychological ROS: negative Ophthalmic ROS: negative ENT ROS: negative Allergy and Immunology ROS: negative Hematological and Lymphatic ROS: negative Endocrine ROS: negative Respiratory ROS: as in hpi Cardiovascular ROS: as in hpi Gastrointestinal ROS: no abdominal pain, change in bowel habits, or black or bloody stools Genito-Urinary ROS: no dysuria, trouble voiding, or hematuria Musculoskeletal ROS: negative Neurological ROS: no TIA or stroke symptoms Dermatological ROS: negative  Physical Examination: Filed Vitals:   02/25/12 1518  BP: 129/64  Pulse: 82  Temp: 98.7 F (37.1 C)  TempSrc: Oral  Resp: 16  Height: 5\' 10"  (1.778 m)  Weight: 70.308 kg (155 lb)  SpO2: 97%    General appearance: alert, cooperative, appears stated age, fatigued and no distress Head: Normocephalic, without obvious abnormality, atraumatic Eyes: conjunctivae/corneas clear. PERRL, EOM's intact. Throat: lips, mucosa, and tongue normal; teeth and gums normal Neck: no adenopathy, no carotid bruit, no JVD, supple, symmetrical, trachea midline and thyroid not enlarged,  symmetric, no tenderness/mass/nodules Back: symmetric, no curvature. ROM normal. No CVA tenderness. Resp: Decreased air entry at the bases without definite crackles. No wheezing Cardio: irregular rate and rhythm, no murmur, click, rub or gallop GI: soft, non-tender; bowel sounds normal; no masses,  no organomegaly Extremities: Full range of motion of the left elbow. Full range of motion of the right hip. Pulses: 2+ and symmetric Skin: Skin color, texture, turgor normal. No rashes or lesions Lymph nodes: Cervical, supraclavicular, and axillary nodes normal. Neurologic: She is alert and oriented x3. No focal neurological deficits were present.  Laboratory Data: Results for orders placed during the hospital encounter of 02/25/12 (from the past 48 hour(s))  CBC WITH DIFFERENTIAL     Status: Abnormal   Collection Time   02/25/12  3:18 PM      Component Value Range Comment   WBC 9.9  4.0 - 10.5 K/uL    RBC 4.07  3.87 - 5.11 MIL/uL    Hemoglobin 12.5  12.0 - 15.0 g/dL    HCT 96.0  45.4 - 09.8 %    MCV 91.4  78.0 - 100.0 fL  MCH 30.7  26.0 - 34.0 pg    MCHC 33.6  30.0 - 36.0 g/dL    RDW 16.1  09.6 - 04.5 %    Platelets 323  150 - 400 K/uL    Neutrophils Relative 78 (*) 43 - 77 %    Neutro Abs 7.7  1.7 - 7.7 K/uL    Lymphocytes Relative 10 (*) 12 - 46 %    Lymphs Abs 1.0  0.7 - 4.0 K/uL    Monocytes Relative 10  3 - 12 %    Monocytes Absolute 1.0  0.1 - 1.0 K/uL    Eosinophils Relative 1  0 - 5 %    Eosinophils Absolute 0.1  0.0 - 0.7 K/uL    Basophils Relative 0  0 - 1 %    Basophils Absolute 0.0  0.0 - 0.1 K/uL   BASIC METABOLIC PANEL     Status: Abnormal   Collection Time   02/25/12  3:18 PM      Component Value Range Comment   Sodium 130 (*) 135 - 145 mEq/L    Potassium 4.0  3.5 - 5.1 mEq/L    Chloride 95 (*) 96 - 112 mEq/L    CO2 29  19 - 32 mEq/L    Glucose, Bld 128 (*) 70 - 99 mg/dL    BUN 29 (*) 6 - 23 mg/dL    Creatinine, Ser 4.09  0.50 - 1.10 mg/dL    Calcium 81.1 (*)  8.4 - 10.5 mg/dL    GFR calc non Af Amer 75 (*) >90 mL/min    GFR calc Af Amer 87 (*) >90 mL/min   PROTIME-INR     Status: Abnormal   Collection Time   02/25/12  3:18 PM      Component Value Range Comment   Prothrombin Time 27.5 (*) 11.6 - 15.2 seconds    INR 2.72 (*) 0.00 - 1.49   PRO B NATRIURETIC PEPTIDE     Status: Abnormal   Collection Time   02/25/12  4:20 PM      Component Value Range Comment   Pro B Natriuretic peptide (BNP) 1629.0 (*) 0 - 450 pg/mL   HEPATIC FUNCTION PANEL     Status: Abnormal   Collection Time   02/25/12  4:20 PM      Component Value Range Comment   Total Protein 6.8  6.0 - 8.3 g/dL    Albumin 2.8 (*) 3.5 - 5.2 g/dL    AST 30  0 - 37 U/L    ALT 22  0 - 35 U/L    Alkaline Phosphatase 80  39 - 117 U/L    Total Bilirubin 0.3  0.3 - 1.2 mg/dL    Bilirubin, Direct 0.1  0.0 - 0.3 mg/dL    Indirect Bilirubin 0.2 (*) 0.3 - 0.9 mg/dL   LIPASE, BLOOD     Status: Normal   Collection Time   02/25/12  4:20 PM      Component Value Range Comment   Lipase 50  11 - 59 U/L   DIGOXIN LEVEL     Status: Normal   Collection Time   02/25/12  4:20 PM      Component Value Range Comment   Digoxin Level 1.1  0.8 - 2.0 ng/mL   TROPONIN I     Status: Normal   Collection Time   02/25/12  5:14 PM      Component Value Range Comment   Troponin I <0.30  <0.30  ng/mL   URINALYSIS, ROUTINE W REFLEX MICROSCOPIC     Status: Abnormal   Collection Time   02/25/12  5:31 PM      Component Value Range Comment   Color, Urine YELLOW  YELLOW    APPearance CLEAR  CLEAR    Specific Gravity, Urine 1.020  1.005 - 1.030    pH 6.0  5.0 - 8.0    Glucose, UA NEGATIVE  NEGATIVE mg/dL    Hgb urine dipstick TRACE (*) NEGATIVE    Bilirubin Urine NEGATIVE  NEGATIVE    Ketones, ur NEGATIVE  NEGATIVE mg/dL    Protein, ur NEGATIVE  NEGATIVE mg/dL    Urobilinogen, UA 0.2  0.0 - 1.0 mg/dL    Nitrite NEGATIVE  NEGATIVE    Leukocytes, UA NEGATIVE  NEGATIVE   URINE MICROSCOPIC-ADD ON     Status: Abnormal    Collection Time   02/25/12  5:31 PM      Component Value Range Comment   Squamous Epithelial / LPF MANY (*) RARE    WBC, UA 3-6  <3 WBC/hpf    RBC / HPF 0-2  <3 RBC/hpf     Imaging Studies: Dg Chest Portable 1 View  02/25/2012  *RADIOLOGY REPORT*  Clinical Data: Weakness and cough  PORTABLE CHEST - 1 VIEW  Comparison: 02/09/2012 CT, 02/04/2012 chest radiograph  Findings: Interval increase in now moderate enlargement of the cardiomediastinal silhouette is noted with patchy bibasilar consolidation.  No pleural effusion.  Mildly diffusely prominent residual markings are again noted.  No acute osseous finding.  IMPRESSION: Increase in now moderate cardiomegaly with patchy bibasilar airspace opacities which could indicate early edema or atelectasis versus pneumonia.  If the patient is thought to be fluid overloaded, consider follow-up after diuresis if clinically appropriate.   Original Report Authenticated By: Christiana Pellant, M.D.     EKG: EKG shows atrial fibrillation at 84 beats per minute. Low voltage QRS. No definite Q waves. No concerning ST changes identified.  Impression/Recommendations  Principal Problem:  *Near syncope Active Problems:  Atrial fibrillation  Hypertension  Chronic anticoagulation  Generalized weakness  Acute pericardial effusion   Assessment: This is 79 year old, Caucasian female, with atrial fibrillation, hypertension, hypercholesterolemia, who presents with generalized weakness since the last 5-6 days. She was started on digoxin on December 26 for better heart rate control. She also underwent an echocardiogram, which showed moderate to large pericardial effusion. There was no cardiac tamponade noted.  Plan:  #1 generalized weakness: This is for the most part, probably secondary to digoxin. Dig Level was normal. She doesn't have any infectious etiology for her weakness. The only other pathology that could be causing her weakness is the pericardial effusion.  #2 near  syncope: This is somewhat concerning in a patient with moderate to large pericardial effusion. Her blood pressure is stable at this time. She's not tachycardic. EKG is low-voltage.  #3 atrial fibrillation: Heart rate is currently well controlled. Defer further management to the cardiologist.  #4 she is on chronic anticoagulation and INR is therapeutic.  #5 abnormal chest x-ray: She doesn't have any definite crackles on examination. Her white cell count is normal. She is saturating well on room air. Plus she is afebrile. I will defer further management to the cardiologist.   I'm concerned that her pericardial effusion may be causing circulatory compromise. I have discussed this with the Dr. Carolanne Grumbling, with cardiology and she is agreeable for this patient to be transferred to [facility] for further evaluation. She  may need to undergo repeat echocardiogram. We'll defer further management for the above issues to the accepting team at [facility]. Patient also has hyponatremia, etiology of which remains unclear.  Have conveyed the above to the ED physician,  Dr. Deretha Emory, who will arrange for transfer to [facility] in [place]. Have notified the patient and her daughter-in-law.   [facility]  [facility] Pager [phone]  If 7PM-7AM, please contact night-coverage.  [email or web address] Password [facility]  02/25/2012, 8:09 PM

## 2012-02-25 NOTE — ED Provider Notes (Signed)
History   This chart was scribed for Tamara Jakes, MD by Leone Payor, ED Scribe. This patient was seen in room APA16A/APA16A and the patient's care was started at 1522.   CSN: [id number]  Arrival date & time 02/25/12  1444   First MD Initiated Contact with Patient 02/25/12 1522      Chief Complaint  Patient presents with  . Weakness    Level 5 Caveat- Patient not cooperating.   The history is provided by a relative. No language interpreter was used.    Tamara [name] is a 79 y.o. female who presents to the Emergency Department complaining of new, gradually worsening weakness starting 02/04/12. Pt was told that she had fluid around her heart after having an ECHO cardiogram performed. She had the test done after she was brought in to the ED for chest pain. Pt was seen by NP and was prescribed digoxin 1 week ago after which pt began feeling much weaker.     Pt lives by herself. Pt has h/o HTN, hyperlipidemia, atrial fibrillation, pneumonia (8/13).  Pt denies smoking and alcohol use.  PCP Dr. Catalina Pizza. Cardiologist Dr. Jiles Crocker Past Medical History  Diagnosis Date  . Hypertension   . Atrial fibrillation     Onset well before 2004; Negative stress nuclear study in 01/2003  . Osteoporosis   . Acute colitis 09/2011    C. difficile negative  . Chronic anticoagulation   . Hyperlipidemia   . Hiatal hernia     by CT in 2013; also noted was an adrenal adenoma, duodenal lipoma right ovarian cyst, post hysterectomy  . Pneumonia 09/2011    09/2011    Past Surgical History  Procedure Date  . Abdominal hysterectomy     No oophorectomy  . Appendectomy   . Colonoscopy 2005    Reportedly normal screening study    Family History  Problem Relation Age of Onset  . Heart attack Father   . Cancer Mother   . Hypertension Father     History  Substance Use Topics  . Smoking status: Never Smoker   . Smokeless tobacco: Never Used  . Alcohol Use: No    No OB history  provided.   Review of Systems  Unable to perform ROS: Other    Allergies  Review of patient's allergies indicates no known allergies.  Home Medications   Current Outpatient Rx  Name  Route  Sig  Dispense  Refill  . DIGOXIN 0.125 MG PO TABS   Oral   Take 1 tablet (0.125 mg total) by mouth daily.   90 tablet   0   . LEVOTHYROXINE SODIUM 125 MCG PO TABS   Oral   Take 125 mcg by mouth daily.         Marland Kitchen LISINOPRIL-HYDROCHLOROTHIAZIDE 20-25 MG PO TABS   Oral   Take 1 tablet by mouth daily.         Marland Kitchen MAGNESIUM 250 MG PO TABS   Oral   Take 250 mg by mouth daily.         Marland Kitchen MEMANTINE HCL 10 MG PO TABS   Oral   Take 10 mg by mouth 2 (two) times daily.         Marland Kitchen OMEPRAZOLE 40 MG PO CPDR   Oral   Take 40 mg by mouth daily. For heartburn         . POTASSIUM CHLORIDE CRYS ER 20 MEQ PO TBCR   Oral   Take 20  mEq by mouth daily.         Marland Kitchen SIMVASTATIN 20 MG PO TABS   Oral   Take 40 mg by mouth every evening.          Marland Kitchen TRAMADOL HCL 50 MG PO TABS   Oral   Take 50 mg by mouth at bedtime.         Marland Kitchen VERAPAMIL HCL ER 240 MG PO TBCR   Oral   Take 240 mg by mouth 2 (two) times daily.         . WARFARIN SODIUM 5 MG PO TABS   Oral   Take 2.5-5 mg by mouth daily. Take 5mg  on Mon.,Wed.,Fri.,Sat.,Sun., and 2.5mg  on Tues.,Thur.,         . ALENDRONATE SODIUM 70 MG PO TABS   Oral   Take 70 mg by mouth every 7 (seven) days. Takes on Sunday . Take with a full glass of water on an empty stomach for the bones           BP 129/64  Pulse 82  Temp 98.7 F (37.1 C) (Oral)  Resp 16  Ht 5\' 10"  (1.778 m)  Wt 155 lb (70.308 kg)  BMI 22.24 kg/m2  SpO2 97%  Physical Exam  Nursing note and vitals reviewed. Constitutional: She is oriented to person, place, and time. She appears well-developed and well-nourished. No distress.  HENT:  Head: Normocephalic and atraumatic.  Right Ear: External ear normal.  Left Ear: External ear normal.  Nose: Nose normal.   Mouth/Throat: Oropharynx is clear and moist.  Eyes: EOM are normal.  Neck: Neck supple. No tracheal deviation present.  Cardiovascular: Normal rate and normal heart sounds.   No murmur heard.      Heart is irregular.   Pulmonary/Chest: Effort normal and breath sounds normal. No respiratory distress.  Abdominal: Soft. Bowel sounds are normal. There is no tenderness.  Musculoskeletal: Normal range of motion. She exhibits no edema.  Lymphadenopathy:    She has no cervical adenopathy.  Neurological: She is alert and oriented to person, place, and time. No cranial nerve deficit. She exhibits normal muscle tone. Coordination normal.       Pt able to move both sets of fingers, toes, and feet.   Skin: Skin is warm and dry.  Psychiatric: She has a normal mood and affect. Her behavior is normal.    ED Course  Procedures (including critical care time)  DIAGNOSTIC STUDIES: Oxygen Saturation is 97% on room air, adequate by my interpretation.    COORDINATION OF CARE:  4:13 PM Discussed treatment plan which includes CXR, blood work, UA with pt at bedside and pt agreed to plan.   Labs Reviewed  CBC WITH DIFFERENTIAL - Abnormal; Notable for the following:    Neutrophils Relative 78 (*)     Lymphocytes Relative 10 (*)     All other components within normal limits  BASIC METABOLIC PANEL - Abnormal; Notable for the following:    Sodium 130 (*)     Chloride 95 (*)     Glucose, Bld 128 (*)     BUN 29 (*)     Calcium 10.7 (*)     GFR calc non Af Amer 75 (*)     GFR calc Af Amer 87 (*)     All other components within normal limits  PROTIME-INR - Abnormal; Notable for the following:    Prothrombin Time 27.5 (*)     INR 2.72 (*)  All other components within normal limits  PRO B NATRIURETIC PEPTIDE - Abnormal; Notable for the following:    Pro B Natriuretic peptide (BNP) 1629.0 (*)     All other components within normal limits  HEPATIC FUNCTION PANEL - Abnormal; Notable for the following:     Albumin 2.8 (*)     Indirect Bilirubin 0.2 (*)     All other components within normal limits  URINALYSIS, ROUTINE W REFLEX MICROSCOPIC - Abnormal; Notable for the following:    Hgb urine dipstick TRACE (*)     All other components within normal limits  URINE MICROSCOPIC-ADD ON - Abnormal; Notable for the following:    Squamous Epithelial / LPF MANY (*)     All other components within normal limits  LIPASE, BLOOD  DIGOXIN LEVEL  TROPONIN I   Dg Chest Portable 1 View  02/25/2012  *RADIOLOGY REPORT*  Clinical Data: Weakness and cough  PORTABLE CHEST - 1 VIEW  Comparison: 02/09/2012 CT, 02/04/2012 chest radiograph  Findings: Interval increase in now moderate enlargement of the cardiomediastinal silhouette is noted with patchy bibasilar consolidation.  No pleural effusion.  Mildly diffusely prominent residual markings are again noted.  No acute osseous finding.  IMPRESSION: Increase in now moderate cardiomegaly with patchy bibasilar airspace opacities which could indicate early edema or atelectasis versus pneumonia.  If the patient is thought to be fluid overloaded, consider follow-up after diuresis if clinically appropriate.   Original Report Authenticated By: Christiana Pellant, M.D.     Date: 02/25/2012  Rate: 84  Rhythm: atrial fibrillation  QRS Axis: normal  Intervals: normal  ST/T Wave abnormalities: nonspecific ST/T changes  Conduction Disutrbances:none  Narrative Interpretation:   Old EKG Reviewed: changes noted From 09/29/11 afib present  Results for orders placed during the hospital encounter of 02/25/12  CBC WITH DIFFERENTIAL      Component Value Range   WBC 9.9  4.0 - 10.5 K/uL   RBC 4.07  3.87 - 5.11 MIL/uL   Hemoglobin 12.5  12.0 - 15.0 g/dL   HCT 98.1  19.1 - 47.8 %   MCV 91.4  78.0 - 100.0 fL   MCH 30.7  26.0 - 34.0 pg   MCHC 33.6  30.0 - 36.0 g/dL   RDW 29.5  62.1 - 30.8 %   Platelets 323  150 - 400 K/uL   Neutrophils Relative 78 (*) 43 - 77 %   Neutro Abs 7.7  1.7 -  7.7 K/uL   Lymphocytes Relative 10 (*) 12 - 46 %   Lymphs Abs 1.0  0.7 - 4.0 K/uL   Monocytes Relative 10  3 - 12 %   Monocytes Absolute 1.0  0.1 - 1.0 K/uL   Eosinophils Relative 1  0 - 5 %   Eosinophils Absolute 0.1  0.0 - 0.7 K/uL   Basophils Relative 0  0 - 1 %   Basophils Absolute 0.0  0.0 - 0.1 K/uL  BASIC METABOLIC PANEL      Component Value Range   Sodium 130 (*) 135 - 145 mEq/L   Potassium 4.0  3.5 - 5.1 mEq/L   Chloride 95 (*) 96 - 112 mEq/L   CO2 29  19 - 32 mEq/L   Glucose, Bld 128 (*) 70 - 99 mg/dL   BUN 29 (*) 6 - 23 mg/dL   Creatinine, Ser 6.57  0.50 - 1.10 mg/dL   Calcium 84.6 (*) 8.4 - 10.5 mg/dL   GFR calc non Af Amer 75 (*) >90 mL/min  GFR calc Af Amer 87 (*) >90 mL/min  PROTIME-INR      Component Value Range   Prothrombin Time 27.5 (*) 11.6 - 15.2 seconds   INR 2.72 (*) 0.00 - 1.49  PRO B NATRIURETIC PEPTIDE      Component Value Range   Pro B Natriuretic peptide (BNP) 1629.0 (*) 0 - 450 pg/mL  HEPATIC FUNCTION PANEL      Component Value Range   Total Protein 6.8  6.0 - 8.3 g/dL   Albumin 2.8 (*) 3.5 - 5.2 g/dL   AST 30  0 - 37 U/L   ALT 22  0 - 35 U/L   Alkaline Phosphatase 80  39 - 117 U/L   Total Bilirubin 0.3  0.3 - 1.2 mg/dL   Bilirubin, Direct 0.1  0.0 - 0.3 mg/dL   Indirect Bilirubin 0.2 (*) 0.3 - 0.9 mg/dL  LIPASE, BLOOD      Component Value Range   Lipase 50  11 - 59 U/L  URINALYSIS, ROUTINE W REFLEX MICROSCOPIC      Component Value Range   Color, Urine YELLOW  YELLOW   APPearance CLEAR  CLEAR   Specific Gravity, Urine 1.020  1.005 - 1.030   pH 6.0  5.0 - 8.0   Glucose, UA NEGATIVE  NEGATIVE mg/dL   Hgb urine dipstick TRACE (*) NEGATIVE   Bilirubin Urine NEGATIVE  NEGATIVE   Ketones, ur NEGATIVE  NEGATIVE mg/dL   Protein, ur NEGATIVE  NEGATIVE mg/dL   Urobilinogen, UA 0.2  0.0 - 1.0 mg/dL   Nitrite NEGATIVE  NEGATIVE   Leukocytes, UA NEGATIVE  NEGATIVE  DIGOXIN LEVEL      Component Value Range   Digoxin Level 1.1  0.8 - 2.0 ng/mL   TROPONIN I      Component Value Range   Troponin I <0.30  <0.30 ng/mL  URINE MICROSCOPIC-ADD ON      Component Value Range   Squamous Epithelial / LPF MANY (*) RARE   WBC, UA 3-6  <3 WBC/hpf   RBC / HPF 0-2  <3 RBC/hpf     1. Weakness   2. Fatigue   3. Near syncope   4. Pericardial effusion       MDM  Discussed with hospitalist the patient seen by Arman Filter 9 in the ED. We will transfer to [facility] service: Telemetry bed. Patient stable in the ED history of pericardial effusion also may have weakness and fatigue due to digoxin. Patient did have a near syncopal episode yesterday. Patient will need further workup of the pericardial effusion.  Patient will be transferred via [facility] to [facility] for admission.      I personally performed the services described in this documentation, which was scribed in my presence. The recorded information has been reviewed and is accurate.     Tamara Jakes, MD 02/25/12 2012

## 2012-02-25 NOTE — ED Notes (Signed)
Generalized weakness. Recently put on digoxin. Also recently had ECHO which showed fluid around the heart. Pt has became much weaker since yesterday

## 2012-02-25 NOTE — ED Notes (Signed)
Report called to Brayton Caves, Charity fundraiser at [facility]

## 2012-02-25 NOTE — ED Notes (Signed)
Report given to [facility]. 

## 2012-02-26 DIAGNOSIS — R5381 Other malaise: Secondary | ICD-10-CM

## 2012-02-26 LAB — COMPREHENSIVE METABOLIC PANEL
AST: 19 U/L (ref 0–37)
Alkaline Phosphatase: 78 U/L (ref 39–117)
BUN: 21 mg/dL (ref 6–23)
CO2: 24 mEq/L (ref 19–32)
Chloride: 99 mEq/L (ref 96–112)
Creatinine, Ser: 0.66 mg/dL (ref 0.50–1.10)
GFR calc non Af Amer: 78 mL/min — ABNORMAL LOW (ref >90)
Total Bilirubin: 0.5 mg/dL (ref 0.3–1.2)

## 2012-02-26 LAB — CBC
Hemoglobin: 12.3 g/dL (ref 12.0–15.0)
MCH: 29.4 pg (ref 26.0–34.0)
MCHC: 32.4 g/dL (ref 30.0–36.0)
MCV: 90.9 fL (ref 78.0–100.0)

## 2012-02-26 LAB — PHOSPHORUS: Phosphorus: 3.1 mg/dL (ref 2.3–4.6)

## 2012-02-26 LAB — MAGNESIUM: Magnesium: 1.6 mg/dL (ref 1.5–2.5)

## 2012-02-26 LAB — PROTIME-INR: Prothrombin Time: 21.1 seconds — ABNORMAL HIGH (ref 11.6–15.2)

## 2012-02-26 MED ORDER — TRAMADOL HCL 50 MG PO TABS
50.0000 mg | ORAL_TABLET | Freq: Every day | ORAL | Status: DC
  Administered 2012-02-26 – 2012-02-28 (×4): 50 mg via ORAL
  Filled 2012-02-26 (×4): qty 1

## 2012-02-26 MED ORDER — MAGNESIUM 250 MG PO TABS: 250.0000 mg | ORAL_TABLET | Freq: Every day | ORAL | Status: DC

## 2012-02-26 MED ORDER — LISINOPRIL-HYDROCHLOROTHIAZIDE 20-25 MG PO TABS
1.0000 | ORAL_TABLET | Freq: Every day | ORAL | Status: DC
Start: 2012-02-26 — End: 2012-02-26

## 2012-02-26 MED ORDER — LEVOTHYROXINE SODIUM 125 MCG PO TABS
125.0000 ug | ORAL_TABLET | Freq: Every day | ORAL | Status: DC
  Administered 2012-02-26 – 2012-02-29 (×4): 125 ug via ORAL
  Filled 2012-02-26 (×5): qty 1

## 2012-02-26 MED ORDER — PANTOPRAZOLE SODIUM 40 MG PO TBEC
80.0000 mg | DELAYED_RELEASE_TABLET | Freq: Every day | ORAL | Status: DC
  Administered 2012-02-26 – 2012-02-29 (×4): 80 mg via ORAL
  Filled 2012-02-26: qty 1
  Filled 2012-02-26 (×3): qty 2

## 2012-02-26 MED ORDER — ATORVASTATIN CALCIUM 20 MG PO TABS
20.0000 mg | ORAL_TABLET | Freq: Every day | ORAL | Status: DC
  Administered 2012-02-26 – 2012-02-28 (×3): 20 mg via ORAL
  Filled 2012-02-26 (×4): qty 1

## 2012-02-26 MED ORDER — SODIUM CHLORIDE 0.9 % IJ SOLN
3.0000 mL | Freq: Two times a day (BID) | INTRAMUSCULAR | Status: DC
  Administered 2012-02-26 – 2012-02-28 (×5): 3 mL via INTRAVENOUS

## 2012-02-26 MED ORDER — HYDROCHLOROTHIAZIDE 25 MG PO TABS
25.0000 mg | ORAL_TABLET | Freq: Every day | ORAL | Status: DC
  Filled 2012-02-26: qty 1

## 2012-02-26 MED ORDER — LISINOPRIL 20 MG PO TABS
20.0000 mg | ORAL_TABLET | Freq: Every day | ORAL | Status: DC
  Administered 2012-02-27 – 2012-02-29 (×3): 20 mg via ORAL
  Filled 2012-02-26 (×4): qty 1

## 2012-02-26 MED ORDER — VERAPAMIL HCL ER 240 MG PO TBCR
240.0000 mg | EXTENDED_RELEASE_TABLET | Freq: Two times a day (BID) | ORAL | Status: DC
  Administered 2012-02-26: 240 mg via ORAL
  Filled 2012-02-26 (×3): qty 1

## 2012-02-26 MED ORDER — MEMANTINE HCL 10 MG PO TABS
10.0000 mg | ORAL_TABLET | Freq: Two times a day (BID) | ORAL | Status: DC
  Administered 2012-02-26 – 2012-02-29 (×8): 10 mg via ORAL
  Filled 2012-02-26 (×9): qty 1

## 2012-02-26 MED ORDER — SIMVASTATIN 40 MG PO TABS: 40.0000 mg | ORAL_TABLET | Freq: Every day | ORAL | Status: DC

## 2012-02-26 MED ORDER — POTASSIUM CHLORIDE CRYS ER 20 MEQ PO TBCR
20.0000 meq | EXTENDED_RELEASE_TABLET | Freq: Every day | ORAL | Status: DC
  Filled 2012-02-26: qty 1

## 2012-02-26 NOTE — H&P (Addendum)
Admit date: 02/25/2012 Referring Physician Dr. Rito Ehrlich at [facility] Primary Cardiologist Dr. Dietrich Pates Chief complaint/reason for admission: Weakness/pericardial effusion   HPI: This is a 79 year old, Caucasian female, with a past medical history of dementia, atrial fibrillation, hypertension, osteoporosis, GERD, who was in her usual state of health till December 14, when she had chest pain, and presented to the emergency department. She was discharged from the ED, and followed up with, Dr. Dietrich Pates. She saw him on December 26. She was found to in afib with RVR during that assessment. She has a history of chronic atrial fibrillation and is on systemic anticoagulation.  Patient was started on digoxin for better rate control.  The patient represented today with weakness that has been getting worse over the last 5-6 days. She thinks that Digoxin may be playing a role.  She also had an echocardiogram done on January 3, which revealed a moderate to large pericardial effusion. Patient tells me that she was in her usual state of health yesterday and dealing with her weakness when she thinks that she may have almost passed out. She denies any head injuries. She did hit left elbow as well as left hip, but has been able to ambulate. She was standing when this happened. She says that she is not sure what happened.  All that she knows is that she was standing and then fell backwards due to weakness.  She has also been short of breath, but that's not any worse. Has been having chest pain, occasionally. Denies any nausea, vomiting. Daughter-in-law is at the, bedside.  Denies any leg swelling. Overall patient is very concerned about her lack of energy, which is very unusual for her.  She is now transferred to [facility] for further evaluation.  Currently she is without complaints and is alert and oriented answering questions appropriately.       PMH:    Past Medical History  Diagnosis Date  . Hypertension   .  Atrial fibrillation     Onset well before 2004; Negative stress nuclear study in 01/2003  . Osteoporosis   . Acute colitis 09/2011    C. difficile negative  . Chronic anticoagulation   . Hyperlipidemia   . Hiatal hernia     by CT in 2013; also noted was an adrenal adenoma, duodenal lipoma right ovarian cyst, post hysterectomy  . Pneumonia 09/2011    Moderate to large Pericardial effusion - no tamponade by echo 02/24/2012    09/2011     PSH:    Past Surgical History  Procedure Date  . Abdominal hysterectomy     No oophorectomy  . Appendectomy   . Colonoscopy 2005    Reportedly normal screening study    ALLERGIES:   Review of patient's allergies indicates no known allergies.  Prior to Admit Meds:   Prescriptions prior to admission  Medication Sig Dispense Refill  . alendronate (FOSAMAX) 70 MG tablet Take 70 mg by mouth every 7 (seven) days. Takes on Sunday . Take with a full glass of water on an empty stomach for the bones      . digoxin (LANOXIN) 0.125 MG tablet Take 1 tablet (0.125 mg total) by mouth daily.  90 tablet  0  . levothyroxine (SYNTHROID, LEVOTHROID) 125 MCG tablet Take 125 mcg by mouth daily.      Marland Kitchen lisinopril-hydrochlorothiazide (PRINZIDE,ZESTORETIC) 20-25 MG per tablet Take 1 tablet by mouth daily.      . Magnesium 250 MG TABS Take 250 mg by mouth daily.      Marland Kitchen  memantine (NAMENDA) 10 MG tablet Take 10 mg by mouth 2 (two) times daily.      Marland Kitchen omeprazole (PRILOSEC) 40 MG capsule Take 40 mg by mouth daily. For heartburn      . potassium chloride SA (K-DUR,KLOR-CON) 20 MEQ tablet Take 20 mEq by mouth daily.      . simvastatin (ZOCOR) 20 MG tablet Take 40 mg by mouth every evening.       . traMADol (ULTRAM) 50 MG tablet Take 50 mg by mouth at bedtime.      . verapamil (CALAN-SR) 240 MG CR tablet Take 240 mg by mouth 2 (two) times daily.      Marland Kitchen warfarin (COUMADIN) 5 MG tablet Take 2.5-5 mg by mouth daily. Take 5mg  on Mon.,Wed.,Fri.,Sat.,Sun., and 2.5mg  on Tues.,Thur.,        Family HX:    Family History  Problem Relation Age of Onset  . Heart attack Father   . Cancer Mother   . Hypertension Father    Social HX:    History   Social History  . Marital Status: Widowed    Spouse Name: N/A    Number of Children: N/A  . Years of Education: N/A   Occupational History  . Retired    Social History Main Topics  . Smoking status: Never Smoker   . Smokeless tobacco: Never Used  . Alcohol Use: No  . Drug Use: No  . Sexually Active: No   Other Topics Concern  . Not on file   Social History Narrative  . No narrative on file     ROS:  All 11 ROS were addressed and are negative except what is stated in the HPI  PHYSICAL EXAM Filed Vitals:   02/25/12 2236  BP: 114/59  Pulse: 69  Temp: 99 F (37.2 C)  Resp: 22   General: Well developed, well nourished, in no acute distress Head: Eyes PERRLA, No xanthomas.   Normal cephalic and atramatic  Lungs:   Clear bilaterally to auscultation and percussion. Heart:  Irregularly irregular S1 S2 Pulses are 2+ & equal.            No carotid bruit. No JVD.  No abdominal bruits. No femoral bruits. Abdomen: Bowel sounds are positive, abdomen soft and non-tender without masses Extremities:   No clubbing, cyanosis or edema.  DP +1 Neuro: Alert and oriented X 3. Psych:  Good affect, responds appropriately   Labs:   Lab Results  Component Value Date   WBC 9.9 02/25/2012   HGB 12.5 02/25/2012   HCT 37.2 02/25/2012   MCV 91.4 02/25/2012   PLT 323 02/25/2012    Lab 02/25/12 1620 02/25/12 1518  NA -- 130*  K -- 4.0  CL -- 95*  CO2 -- 29  BUN -- 29*  CREATININE -- 0.75  CALCIUM -- 10.7*  PROT 6.8 --  BILITOT 0.3 --  ALKPHOS 80 --  ALT 22 --  AST 30 --  GLUCOSE -- 128*   Lab Results  Component Value Date   TROPONINI <0.30 02/25/2012   No results found for this basename: PTT   Lab Results  Component Value Date   INR 2.72* 02/25/2012   INR 2.50* 02/04/2012   INR 2.23* 10/04/2011       Radiology:   *RADIOLOGY REPORT*  Clinical Data: Weakness and cough  PORTABLE CHEST - 1 VIEW  Comparison: 02/09/2012 CT, 02/04/2012 chest radiograph  Findings: Interval increase in now moderate enlargement of the  cardiomediastinal silhouette is  noted with patchy bibasilar  consolidation. No pleural effusion. Mildly diffusely prominent  residual markings are again noted. No acute osseous finding.  IMPRESSION:  Increase in now moderate cardiomegaly with patchy bibasilar  airspace opacities which could indicate early edema or atelectasis  versus pneumonia. If the patient is thought to be fluid  overloaded, consider follow-up after diuresis if clinically  appropriate.  Original Report Authenticated By: Christiana Pellant, M.D.    EKG:  Atrial fibrillation with low voltage, T  Wave abnormality consider inferior ischemia *[facility]* [address] [place], [place] [address] [phone]  ------------------------------------------------------------ Transthoracic Echocardiography  Patient: Tamara [name], Nicks MR #: [id number] Study Date: 02/24/2012 Gender: F Age: 43 Height: 165.1cm Weight: 65.8kg BSA: 1.70m^2 Pt. Status: Room:  SONOGRAPHER Karrie Doffing ATTENDING Golda Acre REFERRING Joni Reining PERFORMING Delma Freeze Penn cc:  ------------------------------------------------------------ LV EF: 55% - 60%  ------------------------------------------------------------ Indications: Atrial fibrillation - currently SR 427.31.  ------------------------------------------------------------ History: PMH: Chest pain. PMH: fell backwards this AM without any warning Hyperlipidemia Risk factors: Hypertension.  ------------------------------------------------------------ Study Conclusions  - Left ventricle: The cavity size was normal. Wall thickness was increased in a pattern of mild LVH. Systolic function was normal. The estimated ejection  fraction was in the range of 55% to 60%. - Pericardium, extracardiac: Moderate to large pericardial effusion surrounds heart. There is some areas around apex of consolidation. The effusion measures about 16 mm in maximal dimension. There is no RA collapse. The RV has some diastolic indentation. The IVC is dilated but collapses with inspiration. Mitral inflow is unreliable with afib. Overall does not meet criteria for tamponade by echo criteria. Impressions:  - Patient called. Daughter in law called Dorothyann Peng).Patient will be set up for further testing. Told to bring patient to ER if becomes dizzy, severely SOB. Transthoracic echocardiography. M-mode, complete 2D, spectral Doppler, and color Doppler. Height: Height: 165.1cm. Height: 65in. Weight: Weight: 65.8kg. Weight: 144.7lb. Body mass index: BMI: 24.1kg/m^2. Body surface area: BSA: 1.3m^2. Patient status: Outpatient. Location: Echo laboratory.  ------------------------------------------------------------  ------------------------------------------------------------ Left ventricle: The cavity size was normal. Wall thickness was increased in a pattern of mild LVH. Systolic function was normal. The estimated ejection fraction was in the range of 55% to 60%.  ------------------------------------------------------------ Aortic valve: Mildly thickened, mildly calcified leaflets. Doppler: No significant regurgitation.  ------------------------------------------------------------ Mitral valve: Mildly thickened leaflets . Doppler: Trivial regurgitation. Peak gradient: 4mm Hg (D).  ------------------------------------------------------------ Left atrium: The atrium was mildly dilated.  ------------------------------------------------------------ Right ventricle: The cavity size was mildly dilated. Systolic function was normal.  ------------------------------------------------------------ Pulmonic valve: Poorly  visualized. Doppler: No significant regurgitation.  ------------------------------------------------------------ Right atrium: The atrium was mildly dilated.  ------------------------------------------------------------ Pericardium: Moderate to large pericardial effusion surrounds heart. There is some areas around apex of consolidation. The effusion measures about 16 mm in maximal dimension. There is no RA collapse. The RV has some diastolic indentation. The IVC is dilated but collapses with inspiration. Mitral inflow is unreliable with afib. Overall does not meet criteria for tamponade by echo criteria.  ------------------------------------------------------------ Systemic veins: Inferior vena cava: The vessel was mildly dilated; the respirophasic diameter changes were in the normal range (= 50%). Diameter: 18.94mm.  ------------------------------------------------------------  2D measurements Normal Doppler measurements Normal IVC Mitral valve Diam 18.47 mm ------ Peak E vel 105 cm/s ------ Left ventricle Peak A vel 24. cm/s ------ LVID ED, 38.9 mm 43-52 7 chord, Deceleration 123 ms 150-23 PLAX time 0 LVID ES, 27.8 mm 23-38 Peak 4 mm ------ chord, gradient, D Hg  PLAX Peak E/A 4.3 ------ FS, 29 % >29 ratio chord, PLAX LVPW, ED 12.3 mm ------ IVS/LVPW 1.08 <1.3 ratio, ED Ventricular septum IVS, ED 13.3 mm ------ Aorta Root 25 mm ------ diam, ED Left atrium AP dim 42 mm ------ AP dim 2.43 cm/m^2 <2.2 index  M-mode measurements Normal Aorta Root 26 mm 20-37 diam, ED Left atrium AP dim, 52 mm 19-40 ES AP dim 3.01 cm/m^2 <2.2 index, ES LA/Ao 2 ------ root ratio  ------------------------------------------------------------ Prepared and Electronically Authenticated by  Dietrich Pates 2014-01-03T16:56:19.013    ASSESSMENT:  1.  Weakness and fatigue since starting digoxin.  No dizziness or syncope per the patient but the daughter thinks she had presycope  when she fell backwards.  She denies any chest pain. 2.  SOB which she thinks is getting worse - ? Secondary to pericardial effusion vs. Acute diastolic CHF - chest xray with ? Early CHF - lungs are clear on exam with no LE edema vs. Atelectasis.  BNP mildly elevated.   3.  Moderate to large pericardial effusion with no tamponade on echo 02/24/2012.  She has no pulsus paradoxus on exam.? Etiology - she had no evidence of effusion on chest CT done on 12/19 so unclear as to etiology - albumin is low and she has hypercalcemia (both of which are new in the past month) so need to consider underlying malignancy 4.  Atrial fibrillation recently placed on digoxin a few days ago with rate controlled 5.  HTN 6.  Chronic systemic anticoagulation 7.  Hiatal hernia 8.  Pneumonia 09/2011 9.  Hypercalcemia ? Etiology 10.  Hyponatremia  PLAN:   1.  Admit to tele bed 2.  Continue home meds 3.  Recheck echo in am 4.  Stop digoxin 5.  Consider Hospitalist consult for workup of hypercalcemia 6.  At this time will hold on IV Lasix given size of pericardial effusion - need to avoid preload reduction - she has no evidence of CHF clinically on exam.   7.  Consider CVTS for pericardiocentesis  Quintella Reichert, MD  02/26/2012  12:08 AM

## 2012-02-26 NOTE — Progress Notes (Signed)
   SUBJECTIVE:  Came to see the patient for PM rounds.  She was to have an echocardiogram today to evaluate a pericardial effusion.  However, this was not done. However, the patient reports that her breathing is better.  She says she has been sleeping all day however.     PHYSICAL EXAM Filed Vitals:   02/26/12 0435 02/26/12 1000 02/26/12 1005 02/26/12 1410  BP: 114/58 84/71 98/62  128/56  Pulse: 59 61  71  Temp: 98.5 F (36.9 C)   98.3 F (36.8 C)  TempSrc: Oral   Oral  Resp: 21   20  Height:      Weight:      SpO2: 95% 97%  97%   General:  No distress Lungs:  Clear Heart:  RRR Abdomen:  Positive bowel sounds, no rebound no guarding Extremities:  Trace edema  LABS: Lab Results  Component Value Date   TROPONINI <0.30 02/25/2012     Intake/Output Summary (Last 24 hours) at 02/26/12 1722 Last data filed at 02/26/12 1423  Gross per 24 hour  Intake      0 ml  Output    750 ml  Net   -750 ml    ASSESSMENT AND PLAN:  Pericardial effusion:  I was asked to look for the results of an echocardiogram.  However, this was not done today. The patients says that her breathing is better and she has a stable (improved BP).  Lungs are clear.  I do not think that she has signs of tamponade at this time.  However, I did speak with the echo tech on call and asked them to do the echo first in the AM.    Rollene Rotunda 02/26/2012 5:22 PM

## 2012-02-26 NOTE — Progress Notes (Signed)
   Subjective:  Denies CP; recent increase in dyspnea and weakness; probable sycopal episode.   Objective:  Filed Vitals:   02/25/12 1900 02/25/12 2000 02/25/12 2236 02/26/12 0435  BP: 102/59 104/68 114/59 114/58  Pulse: 66 63 69 59  Temp:   99 F (37.2 C) 98.5 F (36.9 C)  TempSrc:   Oral Oral  Resp: 21 24 22 21   Height:      Weight:   150 lb 14.4 oz (68.448 kg)   SpO2: 94% 96% 96% 95%    Intake/Output from previous day:  Intake/Output Summary (Last 24 hours) at 02/26/12 0804 Last data filed at 02/26/12 0643  Gross per 24 hour  Intake      0 ml  Output    150 ml  Net   -150 ml    Physical Exam: Physical exam: Pulsus - 4 Well-developed frail in no acute distress.  Skin is warm and dry.  HEENT is normal.  Neck is supple. No JVD Chest is clear to auscultation with normal expansion.  Cardiovascular exam is bradycardic and irregular Abdominal exam nontender or distended. No masses palpated. Extremities show no edema. neuro grossly intact    Lab Results: Basic Metabolic Panel:  Basename 02/25/12 1518  NA 130*  K 4.0  CL 95*  CO2 29  GLUCOSE 128*  BUN 29*  CREATININE 0.75  CALCIUM 10.7*  MG --  PHOS --   CBC:  Basename 02/25/12 1518  WBC 9.9  NEUTROABS 7.7  HGB 12.5  HCT 37.2  MCV 91.4  PLT 323   Cardiac Enzymes:  Basename 02/25/12 1714  CKTOTAL --  CKMB --  CKMBINDEX --  TROPONINI <0.30     Assessment/Plan:  1 syncope-etiology unclear. She is bradycardic on telemetry. Digoxin recently added and patient also on verapamil. Question bradycardia mediated event. Digoxin discontinued. Hold verapamil today and resume at lower dose tomorrow if heart rate improves. Note LV function normal. 2 atrial fibrillation-hold Coumadin given pericardial effusion and potential need for intervention.  Patient is bradycardic. Discontinue digoxin. Hold verapamil and follow heart rate. 3 pericardial effusion-new since early December based on previous CAT scan.  Moderate to large on January 2. Repeat echocardiogram. May need pericardial window. Note she is not tachycardic, has no JVD and her pulsus is for today. Etiology of effusion unclear. 4 weakness-question secondary to combination of bradycardia and pericardial effusion. Check TSH. 5 hypercalcemia-we will ask the hospitalist to evaluate. 6 hypertension-blood pressure is mildly decreased today with a systolic 100. Hold HCTZ. Hold potassium.  Tamara [name] 02/26/2012, 8:04 AM

## 2012-02-27 ENCOUNTER — Inpatient Hospital Stay (HOSPITAL_COMMUNITY): Payer: Medicare PPO

## 2012-02-27 DIAGNOSIS — I4891 Unspecified atrial fibrillation: Secondary | ICD-10-CM

## 2012-02-27 DIAGNOSIS — I319 Disease of pericardium, unspecified: Secondary | ICD-10-CM

## 2012-02-27 MED ORDER — HEPARIN (PORCINE) IN NACL 100-0.45 UNIT/ML-% IJ SOLN
1200.0000 [IU]/h | INTRAMUSCULAR | Status: DC
  Administered 2012-02-28: 1200 [IU]/h via INTRAVENOUS
  Filled 2012-02-27 (×3): qty 250

## 2012-02-27 MED ORDER — WARFARIN - PHARMACIST DOSING INPATIENT: Freq: Every day | Status: DC

## 2012-02-27 MED ORDER — HEPARIN (PORCINE) IN NACL 100-0.45 UNIT/ML-% IJ SOLN
800.0000 [IU]/h | INTRAMUSCULAR | Status: DC
  Administered 2012-02-27: 800 [IU]/h via INTRAVENOUS
  Filled 2012-02-27: qty 250

## 2012-02-27 MED ORDER — METOPROLOL SUCCINATE 12.5 MG HALF TABLET
12.5000 mg | ORAL_TABLET | Freq: Every day | ORAL | Status: DC
  Administered 2012-02-27 – 2012-02-29 (×3): 12.5 mg via ORAL
  Filled 2012-02-27 (×3): qty 1

## 2012-02-27 MED ORDER — HEPARIN BOLUS VIA INFUSION
1000.0000 [IU] | Freq: Once | INTRAVENOUS | Status: AC
  Administered 2012-02-27: 1000 [IU] via INTRAVENOUS
  Filled 2012-02-27: qty 1000

## 2012-02-27 MED ORDER — WARFARIN SODIUM 5 MG PO TABS
5.0000 mg | ORAL_TABLET | Freq: Once | ORAL | Status: AC
  Administered 2012-02-27: 5 mg via ORAL
  Filled 2012-02-27: qty 1

## 2012-02-27 NOTE — Progress Notes (Addendum)
ANTICOAGULATION CONSULT NOTE - Initial Consult  Pharmacy Consult for heparin Indication: atrial fibrillation  No Known Allergies  Patient Measurements: Height: 5\' 10"  (177.8 cm) Weight: 150 lb 14.4 oz (68.448 kg) IBW/kg (Calculated) : 68.5  Heparin Dosing Weight: 68 kg  Vital Signs: Temp: 98.5 F (36.9 C) (01/06 0438) Temp src: Oral (01/06 0438) BP: 107/69 mmHg (01/06 0939) Pulse Rate: 79  (01/06 0939)  Labs:  Basename 02/26/12 0650 02/25/12 1714 02/25/12 1518  HGB 12.3 -- 12.5  HCT 38.0 -- 37.2  PLT 350 -- 323  APTT -- -- --  LABPROT 21.1* -- 27.5*  INR 1.90* -- 2.72*  HEPARINUNFRC -- -- --  CREATININE 0.66 -- 0.75  CKTOTAL -- -- --  CKMB -- -- --  TROPONINI -- <0.30 --    Estimated Creatinine Clearance: 55.5 ml/min (by C-G formula based on Cr of 0.66).   Medical History: Past Medical History  Diagnosis Date  . Hypertension   . Atrial fibrillation     Onset well before 2004; Negative stress nuclear study in 01/2003  . Osteoporosis   . Acute colitis 09/2011    C. difficile negative  . Chronic anticoagulation   . Hyperlipidemia   . Hiatal hernia     by CT in 2013; also noted was an adrenal adenoma, duodenal lipoma right ovarian cyst, post hysterectomy  . Pneumonia 09/2011    09/2011    Medications:  Prescriptions prior to admission  Medication Sig Dispense Refill  . alendronate (FOSAMAX) 70 MG tablet Take 70 mg by mouth every 7 (seven) days. Takes on Sunday . Take with a full glass of water on an empty stomach for the bones      . digoxin (LANOXIN) 0.125 MG tablet Take 1 tablet (0.125 mg total) by mouth daily.  90 tablet  0  . levothyroxine (SYNTHROID, LEVOTHROID) 125 MCG tablet Take 125 mcg by mouth daily.      Marland Kitchen lisinopril-hydrochlorothiazide (PRINZIDE,ZESTORETIC) 20-25 MG per tablet Take 1 tablet by mouth daily.      . Magnesium 250 MG TABS Take 250 mg by mouth daily.      . memantine (NAMENDA) 10 MG tablet Take 10 mg by mouth 2 (two) times daily.        Marland Kitchen omeprazole (PRILOSEC) 40 MG capsule Take 40 mg by mouth daily. For heartburn      . potassium chloride SA (K-DUR,KLOR-CON) 20 MEQ tablet Take 20 mEq by mouth daily.      . simvastatin (ZOCOR) 20 MG tablet Take 40 mg by mouth every evening.       . traMADol (ULTRAM) 50 MG tablet Take 50 mg by mouth at bedtime.      . verapamil (CALAN-SR) 240 MG CR tablet Take 240 mg by mouth 2 (two) times daily.      Marland Kitchen warfarin (COUMADIN) 5 MG tablet Take 2.5-5 mg by mouth daily. Take 5mg  on Mon.,Wed.,Fri.,Sat.,Sun., and 2.5mg  on Tues.Cl[place]is Cao.,        Assessment: Patient is an 79 y.o F on coumadin PTA for afib.  Coumadin currently on hold in anticipation for possible surgical intervention for pericardial effusion.  To start heparin for bridging while off coumadin.  INR 1.54 today.  Goal of Therapy:  Heparin level 0.3-0.7 units/ml Monitor platelets by anticoagulation protocol: Yes   Plan:  1) heparin 800 units/hr (no bolus) 2) check 8 hour heparin level  Carly Applegate P 02/27/2012,10:32 AM  ___________________ Adden (2PM): Per cards, OK to resume coumadin since no surgical intervention  for percardial perfusion at this time.   Home coumadin regimen: 5mg  daily except 2.5mg  on Tuesdays and Thursdays (per patient). Will give coumadin 5mg  PO x1 today.

## 2012-02-27 NOTE — Progress Notes (Signed)
  Echocardiogram 2D Echocardiogram Limited has been performed.  Tamara [name] 02/27/2012, 10:03 AM

## 2012-02-27 NOTE — Progress Notes (Signed)
ANTICOAGULATION CONSULT NOTE - Follow Up Consult  Pharmacy Consult for heparin Indication: atrial fibrillation  No Known Allergies  Patient Measurements: Height: 5\' 10"  (177.8 cm) Weight: 150 lb 14.4 oz (68.448 kg) IBW/kg (Calculated) : 68.5   Vital Signs: Temp: 98 F (36.7 C) (01/06 1353) Temp src: Oral (01/06 1353) BP: 114/74 mmHg (01/06 1353) Pulse Rate: 86  (01/06 1353)  Labs:  Basename 02/27/12 2010 02/27/12 1018 02/26/12 0650 02/25/12 1714 02/25/12 1518  HGB -- -- 12.3 -- 12.5  HCT -- -- 38.0 -- 37.2  PLT -- -- 350 -- 323  APTT -- -- -- -- --  LABPROT -- 18.0* 21.1* -- 27.5*  INR -- 1.54* 1.90* -- 2.72*  HEPARINUNFRC 0.20* -- -- -- --  CREATININE -- -- 0.66 -- 0.75  CKTOTAL -- -- -- -- --  CKMB -- -- -- -- --  TROPONINI -- -- -- <0.30 --    Estimated Creatinine Clearance: 55.5 ml/min (by C-G formula based on Cr of 0.66).   Medications:  Scheduled:    . atorvastatin  20 mg Oral q1800  . levothyroxine  125 mcg Oral QAC breakfast  . lisinopril  20 mg Oral Daily  . memantine  10 mg Oral BID  . metoprolol succinate  12.5 mg Oral Daily  . pantoprazole  80 mg Oral Daily  . sodium chloride  3 mL Intravenous Q12H  . traMADol  50 mg Oral QHS  . [COMPLETED] warfarin  5 mg Oral ONCE-1800  . Warfarin - Pharmacist Dosing Inpatient   Does not apply q1800    Assessment: Patient is an 79 y.o F on coumadin PTA for afib. Patient on heparin and Coumadin currently on hold in anticipation for possible surgical intervention for pericardial effusion. The initial heparin level is below goal (HL=0.2).   Goal of Therapy:  Heparin level 0.3-0.7 units/ml Monitor platelets by anticoagulation protocol: Yes   Plan:   -Heparin bolus of 1000 units and increase infusion to 1000 units/hr -Heparin level in 8 hrs  Harland German, Pharm D 02/27/2012 9:41 PM

## 2012-02-27 NOTE — Evaluation (Signed)
Physical Therapy Evaluation Patient Details Name: Tamara [name] MRN: [id number] DOB: October 18, 1924 Today's Date: 02/27/2012 Time: 1191-4782 PT Time Calculation (min): 32 min  PT Assessment / Plan / Recommendation Clinical Impression  pt admitted with SOB with pericardial effusion, afib with AVR.  On eval, pt  with decr activity toldrance and general weakness with gait although tests WFL strength.  Pt can benefit from PT to maximize I.    PT Assessment  Patient needs continued PT services    Follow Up Recommendations  SNF    Does the patient have the potential to tolerate intense rehabilitation      Barriers to Discharge Decreased caregiver support      Equipment Recommendations  None recommended by PT    Recommendations for Other Services     Frequency Min 3X/week    Precautions / Restrictions Precautions Precautions: Fall   Pertinent Vitals/Pain       Mobility  Bed Mobility Bed Mobility: Supine to Sit;Sitting - Scoot to Edge of Bed Supine to Sit: 4: Min assist;With rails;HOB elevated Sitting - Scoot to [place] of Bed: 4: Min assist;With rail Details for Bed Mobility Assistance: struggle to get to EOB with heavy use of the rail Transfers Transfers: Sit to Stand;Stand to Sit Sit to Stand: 4: Min assist;With upper extremity assist;From bed;Other (comment) (mod from lower toilet) Stand to Sit: 4: Min guard;With upper extremity assist;To chair/3-in-1;To toilet (min to lower toilet) Ambulation/Gait Ambulation/Gait Assistance: 4: Min guard;4: Min assist Ambulation Distance (Feet): 180 Feet Assistive device: Rolling walker Ambulation/Gait Assistance Details: weak-kneed gait with short step length, flexed posture; vc's for postural checks and improved use of the RW Gait Pattern: Step-through pattern;Decreased step length - right;Decreased step length - left;Decreased stride length;Trunk flexed;Narrow base of support Stairs: No    Shoulder Instructions     Exercises      PT Diagnosis: Generalized weakness;Other (comment) (decr activity tolerance)  PT Problem List: Decreased strength;Decreased activity tolerance;Decreased balance;Decreased knowledge of use of DME;Cardiopulmonary status limiting activity PT Treatment Interventions: DME instruction;Gait training;Functional mobility training;Therapeutic activities;Balance training;Patient/family education   PT Goals Acute Rehab PT Goals PT Goal Formulation: With patient Time For Goal Achievement: 02/27/12 Potential to Achieve Goals: Good Pt will go Supine/Side to Sit: with supervision;with HOB 0 degrees;Other (comment) (no rail) PT Goal: Supine/Side to Sit - Progress: Goal set today Pt will go Sit to Supine/Side: with supervision;with HOB 0 degrees PT Goal: Sit to Supine/Side - Progress: Goal set today Pt will go Sit to Stand: with supervision;with upper extremity assist PT Goal: Sit to Stand - Progress: Goal set today Pt will Transfer Bed to Chair/Chair to Bed: with supervision PT Transfer Goal: Bed to Chair/Chair to Bed - Progress: Goal set today Pt will Ambulate: >150 feet;with supervision;with least restrictive assistive device PT Goal: Ambulate - Progress: Goal set today  Visit Information  Last PT Received On: 02/27/12 Assistance Needed: +1    Subjective Data  Subjective: I'm pretty happy that I could do this Patient Stated Goal: Home I   Prior Functioning  Home Living Lives With: Alone Type of Home: House Home Access: Level entry Home Layout: One level Bathroom Shower/Tub: Other (comment);Walk-in shower (Walk in tub) Bathroom Toilet: Handicapped height Home Adaptive Equipment: Straight cane;Walker - rolling (armrest for the toilet) Prior Function Level of Independence: Independent with assistive device(s) Driving: Yes Communication Communication: No difficulties    Cognition  Overall Cognitive Status: Appears within functional limits for tasks assessed/performed Arousal/Alertness:  Awake/alert Orientation Level: Appears intact for  tasks assessed Behavior During Session: [facility] for tasks performed    Extremity/Trunk Assessment Right Lower Extremity Assessment RLE ROM/Strength/Tone: Within functional levels RLE Coordination: WFL - gross/fine motor Trunk Assessment Trunk Assessment: Normal   Balance Balance Balance Assessed: Yes Static Sitting Balance Static Sitting - Balance Support: Feet supported;Right upper extremity supported;Left upper extremity supported Static Sitting - Level of Assistance: 5: Stand by assistance Static Standing Balance Static Standing - Balance Support: During functional activity;Bilateral upper extremity supported Static Standing - Level of Assistance: 5: Stand by assistance  End of Session PT - End of Session Equipment Utilized During Treatment: Gait belt Activity Tolerance: Patient tolerated treatment well Patient left: in chair;with call bell/phone within reach;with family/visitor present Nurse Communication: Mobility status  GP     Tamara [name], Eliseo Gum 02/27/2012, 4:30 PM  02/27/2012  Schlater Bing, PT [phone] (pager)

## 2012-02-27 NOTE — Progress Notes (Signed)
Subjective:  No new complaints.  Eating better.  Objective:  Vital Signs in the last 24 hours: Temp:  [98.3 F (36.8 C)-99.1 F (37.3 C)] 98.5 F (36.9 C) (01/06 0438) Pulse Rate:  [71-89] 79  (01/06 0939) Resp:  [18-20] 19  (01/06 0438) BP: (107-128)/(56-69) 107/69 mmHg (01/06 0939) SpO2:  [97 %-98 %] 98 % (01/06 0438)  Intake/Output from previous day: 01/05 0701 - 01/06 0700 In: 480 [P.O.:480] Out: 900 [Urine:900] Intake/Output from this shift:       . atorvastatin  20 mg Oral q1800  . levothyroxine  125 mcg Oral QAC breakfast  . lisinopril  20 mg Oral Daily  . memantine  10 mg Oral BID  . pantoprazole  80 mg Oral Daily  . sodium chloride  3 mL Intravenous Q12H  . traMADol  50 mg Oral QHS      . heparin 800 Units/hr (02/27/12 1203)    Physical Exam: The patient appears to be in no distress. Head and neck exam reveals that the pupils are equal and reactive. The extraocular movements are full. There is no scleral icterus. Mouth and pharynx are benign. No lymphadenopathy. No carotid bruits. The jugular venous pressure is normal. Thyroid is not enlarged or tender.  Chest is clear to percussion and auscultation. No rales or rhonchi. Expansion of the chest is symmetrical.  Heart reveals no abnormal lift or heave. First and second heart sounds are normal. There is no murmur gallop rub or click.  The abdomen is soft and nontender. Bowel sounds are normoactive. There is no hepatosplenomegaly or mass. There are no abdominal bruits.  Extremities reveal no phlebitis or edema. Pedal pulses are good. There is no cyanosis or clubbing.  Neurologic exam is normal strength and no lateralizing weakness. No sensory deficits.  Integument reveals no rash   Lab Results:  Basename 02/26/12 0650 02/25/12 1518  WBC 8.4 9.9  HGB 12.3 12.5  PLT 350 323    Basename 02/26/12 0650 02/25/12 1518  NA 137 130*  K 3.9 4.0  CL 99 95*  CO2 24 29  GLUCOSE 96 128*  BUN 21 29*  CREATININE  0.66 0.75    Basename 02/25/12 1714  TROPONINI <0.30   Hepatic Function Panel  Basename 02/26/12 0650 02/25/12 1620  PROT 6.3 --  ALBUMIN 2.6* --  AST 19 --  ALT 19 --  ALKPHOS 78 --  BILITOT 0.5 --  BILIDIR -- 0.1  IBILI -- 0.2*   No results found for this basename: CHOL in the last 72 hours No results found for this basename: PROTIME in the last 72 hours  Imaging: Imaging results have been reviewed  Indications: Pericardial effusion 423.9, known.  ------------------------------------------------------------ History: PMH: Atrial fibrillation. Risk factors: Hypertension. Dyslipidemia.  ------------------------------------------------------------ Study Conclusions  - Left ventricle: The cavity size was normal. Wall thickness was normal. Systolic function was vigorous. The estimated ejection fraction was in the range of 65% to 70%. Wall motion was normal; there were no regional wall motion abnormalities. - Pericardium, extracardiac: A moderate, free-flowing pericardial effusion was identified. The fluid had no internal echoes.There was no evidence of hemodynamic compromise. There was a left pleural effusion. Impressions:  - No echocardiographic evidence of hemodynamic compromise. When compared with previous echo of 02/24/12, no significant change in size of effusion.    Cardiac Studies:  Assessment/Plan:  The patient has a moderate pericardial effusion which is not causing any hemodynamic compromise. Repeat echo today is unchanged. I do not feel she  will require pericardiocentesis or window in the near future.   Will resume warfarin which she has been on for many years followed by Dr. Margo Aye. She is very weak. Her son wonders whether she would benefit from a short stay in the [facility] or other short term rehab facility. Will ask PT and OT to evaluate. Will ask care manager to see. Will allow increase activity. Possible discharge in 1-2 days. Will add low dose  toprol for rate control. BP is soft.   LOS: 2 days    Cassell Clement 02/27/2012, 12:40 PM

## 2012-02-27 NOTE — Care Management Note (Signed)
    Page 1 of 1   02/27/2012     1:58:22 PM   CARE MANAGEMENT NOTE 02/27/2012  Patient:  Tamara [name], Tamara [name]   Account Number:  [id number]  Date Initiated:  02/27/2012  Documentation initiated by:  Junius Creamer  Subjective/Objective Assessment:   adm w near syncope     Action/Plan:   lives alone, fam would like short term snf for rehab, sw ref made   Anticipated DC Date:     Anticipated DC Plan:  SKILLED NURSING FACILITY  In-house referral  Clinical Social Worker      DC Planning Services  CM consult      Choice offered to / List presented to:             Status of service:   Medicare Important Message given?   (If response is "NO", the following Medicare IM given date fields will be blank) Date Medicare IM given:   Date Additional Medicare IM given:    Discharge Disposition:    Per UR Regulation:  Reviewed for med. necessity/level of care/duration of stay  If discussed at Long Length of Stay Meetings, dates discussed:    Comments:  1/6 1400 debbie Macky Galik rn,bsn

## 2012-02-27 NOTE — Progress Notes (Signed)
Subjective:  The patient has no complaints this morning except she states that she feels sleepy.  She denies any chest pain or shortness of breath.  Her rhythm remains atrial fibrillation with a controlled ventricular response.  Blood pressure remains in the low normal range.  She is presently off her AV blocking drugs.  She is also presently off her Coumadin. Objective:  Vital Signs in the last 24 hours: Temp:  [98.3 F (36.8 C)-99.1 F (37.3 C)] 98.5 F (36.9 C) (01/06 0438) Pulse Rate:  [61-89] 79  (01/06 0939) Resp:  [18-20] 19  (01/06 0438) BP: (84-128)/(56-71) 107/69 mmHg (01/06 0939) SpO2:  [97 %-98 %] 98 % (01/06 0438)  Intake/Output from previous day: 01/05 0701 - 01/06 0700 In: 480 [P.O.:480] Out: 900 [Urine:900] Intake/Output from this shift:       . atorvastatin  20 mg Oral q1800  . levothyroxine  125 mcg Oral QAC breakfast  . lisinopril  20 mg Oral Daily  . memantine  10 mg Oral BID  . pantoprazole  80 mg Oral Daily  . sodium chloride  3 mL Intravenous Q12H  . traMADol  50 mg Oral QHS      Physical Exam: The patient appears to be in no distress.  Head and neck exam reveals that the pupils are equal and reactive.  The extraocular movements are full.  There is no scleral icterus.  Mouth and pharynx are benign.  No lymphadenopathy.  No carotid bruits.  The jugular venous pressure is normal.  Thyroid is not enlarged or tender.  Chest is clear to percussion and auscultation.  No rales or rhonchi.  Expansion of the chest is symmetrical.  Heart reveals no abnormal lift or heave.  First and second heart sounds are normal.  There is no murmur gallop rub or click.  The abdomen is soft and nontender.  Bowel sounds are normoactive.  There is no hepatosplenomegaly or mass.  There are no abdominal bruits.  Extremities reveal no phlebitis or edema.  Pedal pulses are good.  There is no cyanosis or clubbing.  Neurologic exam is normal strength and no lateralizing  weakness.  No sensory deficits.  Integument reveals no rash  Lab Results:  Basename 02/26/12 0650 02/25/12 1518  WBC 8.4 9.9  HGB 12.3 12.5  PLT 350 323    Basename 02/26/12 0650 02/25/12 1518  NA 137 130*  K 3.9 4.0  CL 99 95*  CO2 24 29  GLUCOSE 96 128*  BUN 21 29*  CREATININE 0.66 0.75    Basename 02/25/12 1714  TROPONINI <0.30   Hepatic Function Panel  Basename 02/26/12 0650 02/25/12 1620  PROT 6.3 --  ALBUMIN 2.6* --  AST 19 --  ALT 19 --  ALKPHOS 78 --  BILITOT 0.5 --  BILIDIR -- 0.1  IBILI -- 0.2*   No results found for this basename: CHOL in the last 72 hours No results found for this basename: PROTIME in the last 72 hours  Imaging: Imaging results have been reviewed  Cardiac Studies: Telemetry shows atrial fibrillation with a controlled ventricular response Assessment/Plan:  Patient Active Hospital Problem List: Near syncope (02/25/2012)   Assessment: No further syncope   Plan: continue off verapamil and off digoxin at this point Atrial fibrillation (09/28/2011)   Assessment: Coumadin on hold pending possible need for surgical procedure for pericardial effusion.heparin bridging while Coumadin on hold   Plan:await results of repeat echocardiogram being done this morning.  At this point the patient does  not show any clinical evidence of pericardial tamponade.  We will also repeat a chest x-ray PA and lateral today.   LOS: 2 days    Cassell Clement 02/27/2012, 9:48 AM

## 2012-02-28 DIAGNOSIS — I309 Acute pericarditis, unspecified: Secondary | ICD-10-CM

## 2012-02-28 LAB — COMPREHENSIVE METABOLIC PANEL
ALT: 28 U/L (ref 0–35)
Alkaline Phosphatase: 99 U/L (ref 39–117)
BUN: 15 mg/dL (ref 6–23)
CO2: 25 mEq/L (ref 19–32)
GFR calc Af Amer: 90 mL/min — ABNORMAL LOW (ref >90)
GFR calc non Af Amer: 78 mL/min — ABNORMAL LOW (ref >90)
Glucose, Bld: 84 mg/dL (ref 70–99)
Potassium: 3.8 mEq/L (ref 3.5–5.1)
Sodium: 137 mEq/L (ref 135–145)
Total Bilirubin: 0.5 mg/dL (ref 0.3–1.2)
Total Protein: 6.6 g/dL (ref 6.0–8.3)

## 2012-02-28 LAB — CBC
HCT: 42.4 % (ref 36.0–46.0)
Hemoglobin: 14 g/dL (ref 12.0–15.0)
MCHC: 33 g/dL (ref 30.0–36.0)
RBC: 4.66 MIL/uL (ref 3.87–5.11)

## 2012-02-28 LAB — PROTIME-INR: INR: 1.57 — ABNORMAL HIGH (ref 0.00–1.49)

## 2012-02-28 LAB — HEPARIN LEVEL (UNFRACTIONATED): Heparin Unfractionated: 0.21 IU/mL — ABNORMAL LOW (ref 0.30–0.70)

## 2012-02-28 MED ORDER — WARFARIN SODIUM 5 MG PO TABS
5.0000 mg | ORAL_TABLET | Freq: Once | ORAL | Status: AC
  Administered 2012-02-28: 5 mg via ORAL
  Filled 2012-02-28: qty 1

## 2012-02-28 NOTE — Progress Notes (Signed)
ANTICOAGULATION CONSULT NOTE - Follow Up Consult  Pharmacy Consult for heparin Indication: atrial fibrillation  No Known Allergies  Patient Measurements: Height: 5\' 10"  (177.8 cm) Weight: 150 lb 14.4 oz (68.448 kg) IBW/kg (Calculated) : 68.5  Heparin Dosing Weight: 68 kg  Vital Signs: Temp: 98.1 F (36.7 C) (01/07 0437) Temp src: Oral (01/07 0437) BP: 114/67 mmHg (01/07 0437) Pulse Rate: 79  (01/07 0437)  Labs:  Basename 02/28/12 0535 02/27/12 2010 02/27/12 1018 02/26/12 0650 02/25/12 1714 02/25/12 1518  HGB 14.0 -- -- 12.3 -- --  HCT 42.4 -- -- 38.0 -- 37.2  PLT 331 -- -- 350 -- 323  APTT -- -- -- -- -- --  LABPROT 18.3* -- 18.0* 21.1* -- --  INR 1.57* -- 1.54* 1.90* -- --  HEPARINUNFRC -- 0.20* -- -- -- --  CREATININE 0.68 -- -- 0.66 -- 0.75  CKTOTAL -- -- -- -- -- --  CKMB -- -- -- -- -- --  TROPONINI -- -- -- -- <0.30 --    Estimated Creatinine Clearance: 55.5 ml/min (by C-G formula based on Cr of 0.68).  Assessment: Patient is an 79 y.o F on anticoagulation for afib.  Heparin level remains subtherapeutic despite rate increased to 1000 units/hr.  No issues with IV line.  INR is also subtherapeutic at 1.57 after one dose of coumadin 5mg  given last night.  Goal of Therapy:  Heparin level 0.3-0.7 units/ml Monitor platelets by anticoagulation protocol: Yes   Plan:  1) coumadin 5mg  x1 2) Increase heparin drip to 1200 units/hr 3) check 8 hour heparin level  Lyda Colcord P 02/28/2012,8:49 AM

## 2012-02-28 NOTE — Progress Notes (Signed)
ANTICOAGULATION CONSULT NOTE - Follow Up Consult  Pharmacy Consult for heparin Indication: atrial fibrillation  No Known Allergies  Patient Measurements: Height: 5\' 10"  (177.8 cm) Weight: 150 lb 14.4 oz (68.448 kg) IBW/kg (Calculated) : 68.5  Heparin Dosing Weight: 68 kg  Vital Signs: Temp: 98.6 F (37 C) (01/07 2033) Temp src: Oral (01/07 2033) BP: 129/72 mmHg (01/07 2033) Pulse Rate: 81  (01/07 2033)  Labs:  Basename 02/28/12 2038 02/28/12 0905 02/28/12 0535 02/27/12 2010 02/27/12 1018 02/26/12 0650  HGB -- -- 14.0 -- -- 12.3  HCT -- -- 42.4 -- -- 38.0  PLT -- -- 331 -- -- 350  APTT -- -- -- -- -- --  LABPROT -- -- 18.3* -- 18.0* 21.1*  INR -- -- 1.57* -- 1.54* 1.90*  HEPARINUNFRC 0.54 0.21* -- 0.20* -- --  CREATININE -- -- 0.68 -- -- 0.66  CKTOTAL -- -- -- -- -- --  CKMB -- -- -- -- -- --  TROPONINI -- -- -- -- -- --    Estimated Creatinine Clearance: 55.5 ml/min (by C-G formula based on Cr of 0.68).  Assessment: Patient is an 79 y.o F on anticoagulation for afib.  Heparin level therapeutic = 0.54 after rate increase.  Goal of Therapy:  Heparin level 0.3-0.7 units/ml Monitor platelets by anticoagulation protocol: Yes   Plan:  Continue heparin infusion at current rate F/u Am heparin level, cbc and PT/INR  Bayard Hugger, PharmD, BCPS  Clinical Pharmacist  Pager: [phone]  02/28/2012,10:26 PM

## 2012-02-28 NOTE — Clinical Social Work Psychosocial (Signed)
Clinical Social Work Department BRIEF PSYCHOSOCIAL ASSESSMENT 02/28/2012  Patient:  Tamara [name], Tamara [name]     Account Number:  [id number]     Admit date:  02/25/2012  Clinical Social Worker:  Thomasene Mohair  Date/Time:  02/28/2012 02:00 PM  Referred by:  Care Management  Date Referred:  02/27/2012 Referred for  SNF Placement   Other Referral:   Interview type:  Patient Other interview type:   family at bedside    PSYCHOSOCIAL DATA Living Status:  ALONE Admitted from facility:   Level of care:   Primary support name:  Milt/Anne Minehart Primary support relationship to patient:  CHILD, ADULT Degree of support available:   strong    CURRENT CONCERNS Current Concerns  Post-Acute Placement   Other Concerns:    SOCIAL WORK ASSESSMENT / PLAN CSW was referred to Pt d/t to concern with her ability to care for herself at dc. CSW met with Pt and her son and daughter-in-law at bedside. 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 Pt has been sick and hospitalized several times this last year and had continued to grow weaker at home. Recommendation is for SNF at dc. Pt and her family are agreeable. CSW beginning SNF bed search for possible dc tomorrow. Pt's insurance requires authorization.   Assessment/plan status:  Psychosocial Support/Ongoing Assessment of Needs Other assessment/ plan:   Information/referral to community resources:   SNF list for [facility] and [facility].    PATIENT'S/FAMILY'S RESPONSE TO PLAN OF CARE: Pt's family familiar with process from previous experience with another family member. CSW addressed concerns and questions of family. Plan is to follow up with daughter-in-law Deziyah Arvin.  Pt is agreeable to amublance transfer as well.    Frederico Hamman, LCSW [phone]

## 2012-02-28 NOTE — Progress Notes (Signed)
Occupational Therapy Evaluation Patient Details Name: Tamara [name] MRN: [id number] DOB: 1924-04-14 Today's Date: 02/28/2012 Time: 4098-1191 OT Time Calculation (min): 24 min  OT Assessment / Plan / Recommendation Clinical Impression  79 yo admitted with SOB, pericardial effusion, afib with AVR. PTA, pt lived alone independently but states she was having more difficulty taking care of herself. Due to below deficits, pt will benefit from skilled OT services to max independence with ADL and functional mobility for ADL to facilitate D/C to next venue of care. Pt will need rehab at SNF. Long term, pt most likely appropriate for ALF.    OT Assessment  Patient needs continued OT Services    Follow Up Recommendations  SNF    Barriers to Discharge Decreased caregiver support    Equipment Recommendations  None recommended by OT    Recommendations for Other Services  none  Frequency  Min 2X/week    Precautions / Restrictions Precautions Precautions: Fall Restrictions Weight Bearing Restrictions: No   Pertinent Vitals/Pain No c/o pain.    ADL  Eating/Feeding: Independent Where Assessed - Eating/Feeding: Chair Grooming: Set up;Supervision/safety Where Assessed - Grooming: Unsupported sitting Upper Body Bathing: Set up;Supervision/safety Where Assessed - Upper Body Bathing: Unsupported sitting Lower Body Bathing: Moderate assistance Where Assessed - Lower Body Bathing: Supported sit to stand Upper Body Dressing: Set up;Supervision/safety Where Assessed - Upper Body Dressing: Unsupported sitting Lower Body Dressing: Moderate assistance Where Assessed - Lower Body Dressing: Supported sit to Pharmacist, hospital: Minimal assistance Toilet Transfer Method: Sit to stand;Stand pivot Acupuncturist: Bedside commode Toileting - Clothing Manipulation and Hygiene: Supervision/safety Where Assessed - Engineer, mining and Hygiene: Standing Equipment Used: Gait  belt Transfers/Ambulation Related to ADLs: min A ADL Comments: limited primarily by endurnace and generalized weakness    OT Diagnosis: Generalized weakness;Cognitive deficits  OT Problem List: Decreased strength;Decreased activity tolerance;Impaired balance (sitting and/or standing);Decreased safety awareness;Decreased knowledge of use of DME or AE;Cardiopulmonary status limiting activity OT Treatment Interventions: Self-care/ADL training;Therapeutic exercise;Energy conservation;DME and/or AE instruction;Therapeutic activities;Cognitive remediation/compensation;Patient/family education;Balance training   OT Goals Acute Rehab OT Goals OT Goal Formulation: With patient Time For Goal Achievement: 03/13/12 Potential to Achieve Goals: Good ADL Goals Pt Will Perform Grooming: with supervision;Unsupported;Standing at sink ADL Goal: Grooming - Progress: Goal set today Pt Will Perform Lower Body Bathing: with set-up;with supervision;Sit to stand from bed;Unsupported;with cueing (comment type and amount);Standing at sink ADL Goal: Lower Body Bathing - Progress: Goal set today Pt Will Perform Lower Body Dressing: with set-up;with supervision;Sit to stand from chair;Unsupported ADL Goal: Lower Body Dressing - Progress: Goal set today Pt Will Transfer to Toilet: with supervision;Ambulation;with DME ADL Goal: Toilet Transfer - Progress: Goal set today Pt Will Perform Toileting - Clothing Manipulation: with modified independence;Sitting on 3-in-1 or toilet;Standing ADL Goal: Toileting - Clothing Manipulation - Progress: Goal set today Pt Will Perform Toileting - Hygiene: with modified independence;Standing at 3-in-1/toilet;Sit to stand from 3-in-1/toilet ADL Goal: Toileting - Hygiene - Progress: Goal set today Additional ADL Goal #1: Pt will complete functional mobility for ADL task @ RW level for ADL with S. ADL Goal: Additional Goal #1 - Progress: Goal set today  Visit Information  Last OT Received  On: 02/28/12 Assistance Needed: +1    Subjective Data      Prior Functioning     Home Living Lives With: Alone Available Help at Discharge: Available PRN/intermittently Type of Home: House Home Access: Level entry Home Layout: One level Bathroom Shower/Tub: Other (comment);Walk-in shower (Walk in  tub) Bathroom Toilet: Handicapped height Bathroom Accessibility: Yes How Accessible: Accessible via walker Home Adaptive Equipment: Straight cane;Walker - rolling (armrest for the toilet) Prior Function Level of Independence: Independent with assistive device(s) Able to Take Stairs?: Yes Driving: Yes Vocation: Retired Musician: No difficulties Dominant Hand: Right         Vision/Perception  wears glasses   Cognition  Overall Cognitive Status: History of cognitive impairments - at baseline Arousal/Alertness: Awake/alert Orientation Level: Appears intact for tasks assessed Behavior During Session: [facility] for tasks performed Cognition - Other Comments: per chart, pt with dementia    Extremity/Trunk Assessment Right Upper Extremity Assessment RUE ROM/Strength/Tone: WFL for tasks assessed RUE Sensation: WFL - Light Touch;WFL - Proprioception RUE Coordination: WFL - gross/fine motor Left Upper Extremity Assessment LUE ROM/Strength/Tone: WFL for tasks assessed LUE Sensation: WFL - Light Touch;WFL - Proprioception LUE Coordination: WFL - gross/fine motor Right Lower Extremity Assessment RLE ROM/Strength/Tone: WFL for tasks assessed RLE Sensation: WFL - Light Touch;WFL - Proprioception RLE Coordination: WFL - gross/fine motor Left Lower Extremity Assessment LLE ROM/Strength/Tone: WFL for tasks assessed LLE Sensation: WFL - Light Touch;WFL - Proprioception LLE Coordination: WFL - gross/fine motor Trunk Assessment Trunk Assessment: Normal     Mobility Bed Mobility Bed Mobility: Rolling Left;Left Sidelying to Sit;Sit to Supine;Sitting - Scoot to Edge of  Bed;Sit to Sidelying Left Rolling Left: 5: Supervision Left Sidelying to Sit: 4: Min assist Supine to Sit: 4: Min assist;HOB flat;With rails Sitting - Scoot to Edge of Bed: 5: Supervision Sit to Supine: 4: Min assist;HOB flat;With rail Sit to Sidelying Left: 4: Min assist;HOB flat;With rail Details for Bed Mobility Assistance: lmited by weakness and poor endurance Transfers Transfers: Sit to Stand;Stand to Sit Sit to Stand: 4: Min assist;From bed;With upper extremity assist Stand to Sit: 4: Min assist;With upper extremity assist;To bed Details for Transfer Assistance: uncontrolled descent               Balance Balance Balance Assessed: Yes   End of Session OT - End of Session Equipment Utilized During Treatment: Gait belt Activity Tolerance: Patient limited by fatigue Patient left: in bed;with call bell/phone within reach Nurse Communication: Mobility status  GO     Lidwina Kaner,HILLARY 02/28/2012, 6:42 PM [facility], OTR/L  [phone] 02/28/2012

## 2012-02-28 NOTE — Progress Notes (Signed)
Subjective:  No new complaints.  Eating better.  Physical therapy saw her yesterday and recommends skilled nursing facility.  The patient remains quite weak.  She has not been having any chest pain or increased shortness of breath.  Oxygen levels are normal on room air.  Rhythm remains atrial fibrillation with a controlled ventricular response on low-dose metoprolol succinate 12.5 mg daily.  Blood pressure is satisfactory and there is no paradoxical pulse on bedside exam.  Objective:  Vital Signs in the last 24 hours: Temp:  [98 F (36.7 C)-98.1 F (36.7 C)] 98.1 F (36.7 C) (01/07 0437) Pulse Rate:  [79-86] 79  (01/07 0437) Resp:  [19-20] 19  (01/07 0437) BP: (114)/(67-74) 114/67 mmHg (01/07 0437) SpO2:  [97 %-98 %] 98 % (01/07 0437)  Intake/Output from previous day: 01/06 0701 - 01/07 0700 In: 240 [P.O.:240] Out: 400 [Urine:400] Intake/Output from this shift:       . atorvastatin  20 mg Oral q1800  . levothyroxine  125 mcg Oral QAC breakfast  . lisinopril  20 mg Oral Daily  . memantine  10 mg Oral BID  . metoprolol succinate  12.5 mg Oral Daily  . pantoprazole  80 mg Oral Daily  . sodium chloride  3 mL Intravenous Q12H  . traMADol  50 mg Oral QHS  . Warfarin - Pharmacist Dosing Inpatient   Does not apply q1800      . heparin 1,000 Units/hr (02/27/12 2206)    Physical Exam: The patient appears to be in no distress. Head and neck exam reveals that the pupils are equal and reactive. The extraocular movements are full. There is no scleral icterus. Mouth and pharynx are benign. No lymphadenopathy. No carotid bruits. The jugular venous pressure is normal. Thyroid is not enlarged or tender.  Chest is clear to percussion and auscultation. No rales or rhonchi. Expansion of the chest is symmetrical.  Heart reveals no abnormal lift or heave. First and second heart sounds are normal. There is no murmur gallop rub or click.  The abdomen is soft and nontender. Bowel sounds are  normoactive. There is no hepatosplenomegaly or mass. There are no abdominal bruits.  Extremities reveal no phlebitis or edema. Pedal pulses are good. There is no cyanosis or clubbing.  Neurologic exam is normal strength and no lateralizing weakness. No sensory deficits.  Integument reveals no rash   Lab Results:  Basename 02/28/12 0535 02/26/12 0650  WBC 7.9 8.4  HGB 14.0 12.3  PLT 331 350    Basename 02/28/12 0535 02/26/12 0650  NA 137 137  K 3.8 3.9  CL 98 99  CO2 25 24  GLUCOSE 84 96  BUN 15 21  CREATININE 0.68 0.66    Basename 02/25/12 1714  TROPONINI <0.30   Hepatic Function Panel  Basename 02/28/12 0535 02/25/12 1620  PROT 6.6 --  ALBUMIN 2.7* --  AST 27 --  ALT 28 --  ALKPHOS 99 --  BILITOT 0.5 --  BILIDIR -- 0.1  IBILI -- 0.2*   No results found for this basename: CHOL in the last 72 hours No results found for this basename: PROTIME in the last 72 hours  Imaging: Imaging results have been reviewed  Indications: Pericardial effusion 423.9, known.  ------------------------------------------------------------ History: PMH: Atrial fibrillation. Risk factors: Hypertension. Dyslipidemia.  ------------------------------------------------------------ Study Conclusions  - Left ventricle: The cavity size was normal. Wall thickness was normal. Systolic function was vigorous. The estimated ejection fraction was in the range of 65% to 70%. Wall motion  was normal; there were no regional wall motion abnormalities. - Pericardium, extracardiac: A moderate, free-flowing pericardial effusion was identified. The fluid had no internal echoes.There was no evidence of hemodynamic compromise. There was a left pleural effusion. Impressions:  - No echocardiographic evidence of hemodynamic compromise. When compared with previous echo of 02/24/12, no significant change in size of effusion.    Cardiac Studies:  Assessment/Plan:  The patient has a moderate  pericardial effusion which is not causing any hemodynamic compromise. Repeat echo  is unchanged. I do not feel she will require pericardiocentesis or window in the near future.   Will resume warfarin which she has been on for many years followed by Dr. Margo Aye. She is very weak. Her son wonders whether she would benefit from a short stay in the [facility] or other short term rehab facility. Plan continue IV heparin and continue Coumadin loading. Start search for skilled nursing facility bed.  Possible discharge Wednesday if bed available.   LOS: 3 days    Cassell Clement 02/28/2012, 9:45 AM

## 2012-02-29 ENCOUNTER — Inpatient Hospital Stay
Admission: RE | Admit: 2012-02-29 | Discharge: 2012-04-21 | Disposition: A | Discharge status: Nursing Home (Includes ICF) | Payer: Medicare PPO | Source: Ambulatory Visit | Attending: Internal Medicine | Admitting: Internal Medicine

## 2012-02-29 DIAGNOSIS — R001 Bradycardia, unspecified: Secondary | ICD-10-CM

## 2012-02-29 DIAGNOSIS — E871 Hypo-osmolality and hyponatremia: Secondary | ICD-10-CM

## 2012-02-29 DIAGNOSIS — I313 Pericardial effusion (noninflammatory): Secondary | ICD-10-CM

## 2012-02-29 DIAGNOSIS — R55 Syncope and collapse: Secondary | ICD-10-CM

## 2012-02-29 DIAGNOSIS — I509 Heart failure, unspecified: Principal | ICD-10-CM

## 2012-02-29 LAB — CBC
HCT: 40.4 % (ref 36.0–46.0)
Hemoglobin: 13 g/dL (ref 12.0–15.0)
MCH: 29.1 pg (ref 26.0–34.0)
MCHC: 32.2 g/dL (ref 30.0–36.0)
MCV: 90.6 fL (ref 78.0–100.0)
RDW: 13.2 % (ref 11.5–15.5)

## 2012-02-29 LAB — PROTIME-INR: INR: 1.54 — ABNORMAL HIGH (ref 0.00–1.49)

## 2012-02-29 MED ORDER — LISINOPRIL 20 MG PO TABS: 20.0000 mg | ORAL_TABLET | Freq: Every day | ORAL | Status: DC

## 2012-02-29 MED ORDER — WARFARIN SODIUM 7.5 MG PO TABS
7.5000 mg | ORAL_TABLET | Freq: Once | ORAL | Status: DC
  Filled 2012-02-29: qty 1

## 2012-02-29 MED ORDER — METOPROLOL SUCCINATE 12.5 MG HALF TABLET: 12.5000 mg | ORAL_TABLET | Freq: Every day | ORAL | Status: DC

## 2012-02-29 MED ORDER — WARFARIN SODIUM 5 MG PO TABS: 2.5000 mg | ORAL_TABLET | Freq: Every day | ORAL | Status: DC

## 2012-02-29 MED ORDER — ENOXAPARIN SODIUM 150 MG/ML ~~LOC~~ SOLN: 1.0000 mg/kg | Freq: Two times a day (BID) | SUBCUTANEOUS | Status: DC

## 2012-02-29 NOTE — Clinical Social Work Placement (Signed)
Clinical Social Work Department CLINICAL SOCIAL WORK PLACEMENT NOTE 02/29/2012  Patient:  Tamara [name], Tamara [name]  Account Number:  [id number] Admit date:  02/25/2012  Clinical Social Worker:  Frederico Hamman, LCSW  Date/time:  02/28/2012 03:00 PM  Clinical Social Work is seeking post-discharge placement for this patient at the following level of care:   SKILLED NURSING   (*CSW will update this form in Epic as items are completed)   02/29/2012  Patient/family provided with [facility] Department of Clinical Social Work's list of facilities offering this level of care within the geographic area requested by the patient (or if unable, by the patient's family).  02/29/2012  Patient/family informed of their freedom to choose among providers that offer the needed level of care, that participate in Medicare, Medicaid or managed care program needed by the patient, have an available bed and are willing to accept the patient.  02/29/2012  Patient/family informed of [facility]' ownership interest in [facility], as well as of the fact that they are under no obligation to receive care at this facility.  PASARR submitted to EDS on 02/28/2012 PASARR number received from [employer] on 02/28/2012  FL2 transmitted to all facilities in geographic area requested by pt/family on  02/28/2012 FL2 transmitted to all facilities within larger geographic area on   Patient informed that his/her managed care company has contracts with or will negotiate with  certain facilities, including the following:     Patient/family informed of bed offers received:  02/29/2012 Patient chooses bed at [facility] Physician recommends and patient chooses bed at    Patient to be transferred to [facility] on  02/29/2012 Patient to be transferred to facility by [facility]  The following physician request were entered in Epic:   Additional Comments: [employer] auth obtained

## 2012-02-29 NOTE — Progress Notes (Signed)
Called report to RN Kathie Rhodes at [facility].  Pt dressed and awaiting ambulance transport. Pt/family given discharge instructions, medication lists, follow up appointments, and when to call the doctor.  Pt/family verbalizes understanding.  Copy of paperwork and scripts given to son-in-law.  Will continue to monitor until discharge. Tamara [name]

## 2012-02-29 NOTE — Progress Notes (Signed)
ANTICOAGULATION CONSULT NOTE - Follow Up Consult  Pharmacy Consult for heparin and coumadin Indication: atrial fibrillation   No Known Allergies  Patient Measurements: Height: 5\' 10"  (177.8 cm) Weight: 150 lb 14.4 oz (68.448 kg) IBW/kg (Calculated) : 68.5  Heparin Dosing Weight: 68 kg  Vital Signs: Temp: 98.4 F (36.9 C) (01/08 0552) Temp src: Oral (01/08 0552) BP: 129/83 mmHg (01/08 0552) Pulse Rate: 87  (01/08 0552)  Labs:  Basename 02/29/12 0510 02/28/12 2038 02/28/12 0905 02/28/12 0535 02/27/12 1018  HGB 13.0 -- -- 14.0 --  HCT 40.4 -- -- 42.4 --  PLT 341 -- -- 331 --  APTT -- -- -- -- --  LABPROT 18.0* -- -- 18.3* 18.0*  INR 1.54* -- -- 1.57* 1.54*  HEPARINUNFRC 0.60 0.54 0.21* -- --  CREATININE -- -- -- 0.68 --  CKTOTAL -- -- -- -- --  CKMB -- -- -- -- --  TROPONINI -- -- -- -- --    Estimated Creatinine Clearance: 55.5 ml/min (by C-G formula based on Cr of 0.68).   Assessment: Patient is an 80 y.o F on heparin and coumadin for afib.  Heparin level is at goal but INR remains subtherapeutic at 1.54.  No bleeding noted.   Goal of Therapy:  Heparin level 0.3-0.7 units/ml; INR 2-3 Monitor platelets by anticoagulation protocol: Yes   Plan:  1) increase coumadin dose to 7.5mg  PO x1 today (1.5x home dose) to get patient therapeutic. 2) recom to resume home regimen for discharge  Lucia Gaskins 02/29/2012,8:05 AM

## 2012-02-29 NOTE — Discharge Summary (Signed)
Discharge Summary   Patient ID: Tamara [name],  MRN: [id number], DOB/AGE: 1924/07/28 79 y.o.  Admit date: 02/25/2012 Discharge date: 02/29/2012  Primary Physician: Dwana Melena, MD Primary Cardiologist: Donnamarie Rossetti, MD  Discharge Diagnoses Principal Problem:  *Syncope Active Problems:  Atrial fibrillation  Hypertension  Chronic anticoagulation  Generalized weakness  Acute pericardial effusion  Hypercalcemia  Bradycardia  Hyposmolality and/or hyponatremia  Weakness   Allergies No Known Allergies  Diagnostic Studies/Procedures  PORTABLE CHEST X-RAY - 02/25/12  IMPRESSION:  Increase in now moderate cardiomegaly with patchy bibasilar  airspace opacities which could indicate early edema or atelectasis  versus pneumonia. If the patient is thought to be fluid  overloaded, consider follow-up after diuresis if clinically  appropriate.   PA/LATERAL CHEST X-RAY - 02/27/12  IMPRESSION:  Cardiomegaly with a small left pleural effusion and bibasilar  volume loss/atelectasis.  TRANSTHORACIC ECHOCARDIOGRAM - 02/27/12  - Left ventricle: The cavity size was normal. Wall thickness was normal. Systolic function was vigorous. The estimated ejection fraction was in the range of 65% to 70%. Wall motion was normal; there were no regional wall motion abnormalities. - Pericardium, extracardiac: A moderate, free-flowing pericardial effusion was identified. The fluid had no internal echoes.There was no evidence of hemodynamic compromise. There was a left pleural effusion.  Impressions:  - No echocardiographic evidence of hemodynamic compromise. When compared with previous echo of 02/24/12, no significant change in size of effusion.  History of Present Illness  Tamara [name] is a 79y 79yo female who was admitted to [facility] on 02/26/12 with the above problem list. She hasa history of dementia, atrial fibrillation, HTN, osteoporosisa nd GERD who was in her usual state of health until  02/04/11 when she presented to the ED with chest pain, CAP and UTI. She ruled out for MI, was discharged with analgesics and antibiotics. She followed up with in the [place] office on 02/16/12 where a-fib with RVR was captured on EKG. She was started on digoxin for added rate-control and continued on Coumadni. Lexiscan Myoview (records unavailable) was ordered for chest pain and TTE was ordered revealing a moderate-large pericardial effusion. She presented to the [facility] ED on 02/25/12 c/o 5-6 days of fatigue, weakness and syncope. She was standing and fell backwards striking her left elbow and hip, but did not incur serious injury. She does not recall specific details of the event. She endorsed chronic unchanged dyspnea or LE swelling. In the ED, EKG revealed a-fib with controlled VR. TnI WNL. Mild pBNP elevation. CXR as above indicated an increase in cardiomegaly from a recent prior radiograph with bibasilar opacities suggesting edema vs atelectasis vs PNA. BMET did reveal a hyponatremia and mild hypercalcemia. CBC unremarkable. There was no evidence of tamponade, hemodynamic compromise or pulsus paradoxus on exam, however given complaints and recent symptoms, there was concern of an enlarging pericardial effusion.   Hospital Course  77 for further evaluation. Digoxin and verapamil were discontinued as she became mildly bradycardic on admission which was suspected to attribute to her syncope. With these adjustments, her symptoms improved. Coumadin was temporarily held, and heparin started in case pericardiocentesis was needed. TSH returned WNL. A TTE as above confirmed a clinical impression that she did not have tamponadic features to her pericardial effusion. This had remained stable from the 02/24/12 echo. Etiology of the effusion was unclear. The extent of her admission there after consisted of medication titration (low-dose Toprol-XL added for rate control) and placement (PT/OT  recommended SNF). The latter has been  arranged. She has been deemed stable for discharge by Dr. Patty Sermons. He felt that she will not requite pericardiocentesis in the near future. Medication adjustments have been outlined below. She will be discharged on Lovenox bridging to Coumadin. INR will need to be checked on 03/02/2012 and reported to Dr. Margo Aye who manages her Coumadin. Further Lovenox use will be determined at that time. Additionally, she will need a follow-up PA/lateral chest x-ray in 1 month. A written prescription has been provided for this. Evidence if hypercalcemia (which normalized shortly after admission) can be followed by her PCP. She will follow-up in the [place] office in 1 week given high complexity status and for further work-up of the etiology to her pericardial effusion. This information is clearly outlined in the discharge AVS.   Discharge Vitals:  Blood pressure 129/83, pulse 87, temperature 98.4 F (36.9 C), temperature source Oral, resp. rate 18, height 5\' 10"  (1.778 m), weight 68.448 kg (150 lb 14.4 oz), SpO2 96.00%.   Labs: Recent Labs  Basename 02/29/12 0510 02/28/12 0535   WBC 8.3 7.9   HGB 13.0 14.0   HCT 40.4 42.4   MCV 90.6 91.0   PLT 341 331    Lab 02/28/12 0535 02/26/12 0650 02/25/12 1620 02/25/12 1518  NA 137 137 -- 130*  K 3.8 3.9 -- 4.0  CL 98 99 -- 95*  CO2 25 24 -- 29  BUN 15 21 -- 29*  CREATININE 0.68 0.66 -- 0.75  CALCIUM 10.2 10.1 -- 10.7*  PROT 6.6 -- -- --  BILITOT 0.5 -- -- --  ALKPHOS 99 -- -- --  ALT 28 -- -- --  AST 27 -- -- --  AMYLASE -- -- -- --  LIPASE -- -- 50 --  GLUCOSE 84 96 -- 128*   Disposition:  Discharge Orders    Future Appointments: Provider: Department: Dept Phone: Center:   03/07/2012 1:40 PM Dyann Kief, PA [facility] [place] [phone] [id number]     Future Orders Please Complete By Expires   Diet - low sodium heart healthy      Increase activity slowly        Follow-up Information     Follow up with Jacolyn Reedy, PA. On 03/07/2012. (AT 1:40 PM FOR FOLLOW-UP. )    Contact information:   [address] [place] [address] [place] [address] [phone]       Follow up with [facility], MD. Schedule an appointment as soon as possible for a visit in 1 week. (FOR POST-HOSPITAL FOLLOW-UP, COUMADIN MANAGMENT AND WORK-UP OF HIGH CALCIUM LEVELS.)    Contact information:   [address] [place] [address] [phone]         Discharge Medications:    Medication List     As of 02/29/2012 10:39 AM    START taking these medications         enoxaparin 150 MG/ML injection   Commonly known as: LOVENOX   Inject 0.46 mLs (70 mg total) into the skin every 12 (twelve) hours.      lisinopril 20 MG tablet   Commonly known as: PRINIVIL,ZESTRIL   Take 1 tablet (20 mg total) by mouth daily.      metoprolol succinate 12.5 mg Tb24   Commonly known as: TOPROL-XL   Take 0.5 tablets (12.5 mg total) by mouth daily.      CHANGE how you take these medications         warfarin 5 MG tablet  Commonly known as: COUMADIN   Take 0.5-1 tablets (2.5-5 mg total) by mouth daily. Take 7.5 mg on 03/01/2011, then take 5mg  on Mon.,Wed.,Fri.,Sat.,Sun., and 2.5mg  on Tues.,Thur.   What changed: doctor's instructions      CONTINUE taking these medications         alendronate 70 MG tablet   Commonly known as: FOSAMAX      levothyroxine 125 MCG tablet   Commonly known as: SYNTHROID, LEVOTHROID      Magnesium 250 MG Tabs      memantine 10 MG tablet   Commonly known as: NAMENDA      omeprazole 40 MG capsule   Commonly known as: PRILOSEC      simvastatin 20 MG tablet   Commonly known as: ZOCOR      traMADol 50 MG tablet   Commonly known as: ULTRAM      STOP taking these medications         digoxin 0.125 MG tablet   Commonly known as: LANOXIN      lisinopril-hydrochlorothiazide 20-25 MG per tablet   Commonly known as: PRINZIDE,ZESTORETIC      potassium  chloride SA 20 MEQ tablet   Commonly known as: K-DUR,KLOR-CON      verapamil 240 MG CR tablet   Commonly known as: CALAN-SR          Where to get your medications    These are the prescriptions that you need to pick up.   You may get these medications from any pharmacy.         enoxaparin 150 MG/ML injection   lisinopril 20 MG tablet   metoprolol succinate 12.5 mg Tb24   warfarin 5 MG tablet           Outstanding Labs/Studies: PT/INR on 03/02/12, CXR in 1 month  Duration of Discharge Encounter: Greater than 30 minutes including physician time.  Signed, R. Hurman Horn, PA-C 02/29/2012, 10:39 AM

## 2012-02-29 NOTE — Progress Notes (Signed)
Subjective:  No new complaints. PT and OT have finished their evaluation. She has not been having any chest pain or increased shortness of breath.  Oxygen levels are normal on room air.  Rhythm remains atrial fibrillation with a controlled ventricular response on low-dose metoprolol succinate 12.5 mg daily.  Blood pressure is satisfactory and there is no paradoxical pulse on bedside exam.  Objective:  Vital Signs in the last 24 hours: Temp:  [98.4 F (36.9 C)-98.6 F (37 C)] 98.4 F (36.9 C) (01/08 0552) Pulse Rate:  [81-87] 87  (01/08 0552) Resp:  [18] 18  (01/08 0552) BP: (120-129)/(70-83) 129/83 mmHg (01/08 0552) SpO2:  [96 %-99 %] 96 % (01/08 0552)  Intake/Output from previous day: 01/07 0701 - 01/08 0700 In: 458.4 [P.O.:240; I.V.:218.4] Out: 800 [Urine:800] Intake/Output from this shift:       . atorvastatin  20 mg Oral q1800  . levothyroxine  125 mcg Oral QAC breakfast  . lisinopril  20 mg Oral Daily  . memantine  10 mg Oral BID  . metoprolol succinate  12.5 mg Oral Daily  . pantoprazole  80 mg Oral Daily  . sodium chloride  3 mL Intravenous Q12H  . traMADol  50 mg Oral QHS  . Warfarin - Pharmacist Dosing Inpatient   Does not apply q1800      . heparin 1,200 Units/hr (02/28/12 1402)    Physical Exam: The patient appears to be in no distress. Head and neck exam reveals that the pupils are equal and reactive. The extraocular movements are full. There is no scleral icterus. Mouth and pharynx are benign. No lymphadenopathy. No carotid bruits. The jugular venous pressure is normal. Thyroid is not enlarged or tender.  Chest is clear to percussion and auscultation. No rales or rhonchi. Expansion of the chest is symmetrical.  Heart reveals no abnormal lift or heave. First and second heart sounds are normal. There is no murmur gallop rub or click. No paradoxical pulse. The abdomen is soft and nontender. Bowel sounds are normoactive. There is no hepatosplenomegaly or mass.  There are no abdominal bruits.  Extremities reveal no phlebitis or edema. Pedal pulses are good. There is no cyanosis or clubbing.  Neurologic exam is normal strength and no lateralizing weakness. No sensory deficits.  Integument reveals no rash   Lab Results:  Basename 02/29/12 0510 02/28/12 0535  WBC 8.3 7.9  HGB 13.0 14.0  PLT 341 331    Basename 02/28/12 0535  NA 137  K 3.8  CL 98  CO2 25  GLUCOSE 84  BUN 15  CREATININE 0.68   No results found for this basename: TROPONINI:2,CK,MB:2 in the last 72 hours Hepatic Function Panel  Basename 02/28/12 0535  PROT 6.6  ALBUMIN 2.7*  AST 27  ALT 28  ALKPHOS 99  BILITOT 0.5  BILIDIR --  IBILI --   No results found for this basename: CHOL in the last 72 hours No results found for this basename: PROTIME in the last 72 hours  Imaging: Imaging results have been reviewed  Indications: Pericardial effusion 423.9, known.  ------------------------------------------------------------ History: PMH: Atrial fibrillation. Risk factors: Hypertension. Dyslipidemia.  ------------------------------------------------------------ Study Conclusions  - Left ventricle: The cavity size was normal. Wall thickness was normal. Systolic function was vigorous. The estimated ejection fraction was in the range of 65% to 70%. Wall motion was normal; there were no regional wall motion abnormalities. - Pericardium, extracardiac: A moderate, free-flowing pericardial effusion was identified. The fluid had no internal echoes.There was no  evidence of hemodynamic compromise. There was a left pleural effusion. Impressions:  - No echocardiographic evidence of hemodynamic compromise. When compared with previous echo of 02/24/12, no significant change in size of effusion.    Cardiac Studies:  Assessment/Plan:  The patient has a moderate pericardial effusion which is not causing any hemodynamic compromise. Repeat echo  is unchanged. I do not feel  she will require pericardiocentesis or window in the near future.   No back on  warfarin which she has been on for many years followed by Dr. Margo Aye. She will be able to go to SNF today. Plan: continue Coumadin loading.INR today 1.54. Would employ lovenox bridging for next two days. Repeat INR Friday at SNF. For followup of moderate pericardial effusion repeat chest xray in about 1 month for heart size. Medical followup with Dr. Catalina Pizza and cardiology followup with Dr Dietrich Pates in [place].   LOS: 4 days    Cassell Clement 02/29/2012, 7:38 AM

## 2012-02-29 NOTE — Progress Notes (Signed)
Physical Therapy Treatment Patient Details Name: Tamara [name] MRN: [id number] DOB: 07-22-1924 Today's Date: 02/29/2012 Time: 4098-1191 PT Time Calculation (min): 40 min  PT Assessment / Plan / Recommendation Comments on Treatment Session  Pt s/p syncope with decr mobility secondary to decr endurance and decr postural stability.  Will benefit from PT to continue to address balance and endurance issues.      Follow Up Recommendations  SNF;Supervision/Assistance - 24 hour                 Equipment Recommendations  None recommended by PT        Frequency Min 3X/week   Plan Discharge plan remains appropriate;Frequency remains appropriate    Precautions / Restrictions Precautions Precautions: Fall Restrictions Weight Bearing Restrictions: No   Pertinent Vitals/Pain VSS, No pain    Mobility  Bed Mobility Bed Mobility: Rolling Left;Left Sidelying to Sit;Sitting - Scoot to Edge of Bed Rolling Left: 5: Supervision Left Sidelying to Sit: 4: Min assist Supine to Sit: Not tested (comment) Sitting - Scoot to Edge of Bed: 5: Supervision Sit to Supine: Not Tested (comment) Sit to Sidelying Left: Not Tested (comment) Details for Bed Mobility Assistance: incr time to get to EOB.   Transfers Transfers: Sit to Stand;Stand to [employer] Transfers Sit to Stand: 4: Min assist;From bed;With upper extremity assist Stand to Sit: 4: Min assist;With upper extremity assist;With armrests;To chair/3-in-1 Stand Pivot Transfers: 4: Min assist (bed to recliner with HHA bil for stability) Details for Transfer Assistance: cues for hand placement.  Uncontrolled descent and needed initial assist for anterior translation to achieve sit to stand each time patient performed sit to stand.   Ambulation/Gait Ambulation/Gait Assistance: 4: Min assist Ambulation Distance (Feet): 175 Feet Assistive device: Rolling walker Ambulation/Gait Assistance Details: continues with weak-kneed gait with short step  length, flexed posture; vcues for postural checks and for safe use of RW as patient pushes RW too far in front of him.   Gait Pattern: Step-through pattern;Decreased step length - right;Decreased step length - left;Decreased stride length;Trunk flexed;Narrow base of support Gait velocity: .80 feet per second suggestive of recurrent fall risk.  Took incr time to ambulate 175 feet.   Stairs: No Corporate treasurer: No    PT Goals Acute Rehab PT Goals PT Goal: Supine/Side to Sit - Progress: Progressing toward goal PT Goal: Sit to Stand - Progress: Progressing toward goal PT Transfer Goal: Bed to Chair/Chair to Bed - Progress: Progressing toward goal PT Goal: Ambulate - Progress: Progressing toward goal  Visit Information  Last PT Received On: 02/29/12 Assistance Needed: +1    Subjective Data  Subjective: "I have called to go to the bathroom and noone came." Patient Stated Goal: Home   Cognition  Overall Cognitive Status: History of cognitive impairments - at baseline Arousal/Alertness: Awake/alert Orientation Level: Appears intact for tasks assessed Behavior During Session: [facility] for tasks performed Cognition - Other Comments: per chart, pt with dementia    Balance  Static Sitting Balance Static Sitting - Balance Support: Feet supported;No upper extremity supported Static Sitting - Level of Assistance: 5: Stand by assistance Static Sitting - Comment/# of Minutes: up to 5 min Static Standing Balance Static Standing - Balance Support: Bilateral upper extremity supported;During functional activity Static Standing - Level of Assistance: 5: Stand by assistance Static Standing - Comment/# of Minutes: Pt stood statically for at least 5 minutes washing hands and brushing dentures.    End of Session PT - End of Session Equipment  Utilized During Treatment: Gait belt Activity Tolerance: Patient tolerated treatment well Patient left: in chair;with call bell/phone within  reach Nurse Communication: Mobility status        INGOLD,Alexianna Nachreiner 02/29/2012, 12:27 PM  [facility] [phone] (pager)

## 2012-03-01 ENCOUNTER — Encounter (HOSPITAL_COMMUNITY): Payer: Medicare PPO

## 2012-03-01 ENCOUNTER — Ambulatory Visit (HOSPITAL_COMMUNITY): Payer: Medicare PPO

## 2012-03-01 ENCOUNTER — Ambulatory Visit: Payer: Medicare PPO | Admitting: Adult Health

## 2012-03-01 ENCOUNTER — Other Ambulatory Visit (HOSPITAL_COMMUNITY): Payer: Medicare Other

## 2012-03-02 ENCOUNTER — Encounter: Payer: Self-pay | Admitting: *Deleted

## 2012-03-02 ENCOUNTER — Ambulatory Visit: Payer: Medicare Other | Admitting: Adult Health

## 2012-03-05 ENCOUNTER — Ambulatory Visit (HOSPITAL_COMMUNITY)
Admit: 2012-03-05 | Discharge: 2012-03-05 | Disposition: A | Discharge status: Home / Self Care | Payer: Medicare PPO | Attending: Internal Medicine | Admitting: Internal Medicine

## 2012-03-05 DIAGNOSIS — I509 Heart failure, unspecified: Secondary | ICD-10-CM | POA: Insufficient documentation

## 2012-03-05 DIAGNOSIS — J90 Pleural effusion, not elsewhere classified: Secondary | ICD-10-CM | POA: Insufficient documentation

## 2012-03-05 DIAGNOSIS — M25519 Pain in unspecified shoulder: Secondary | ICD-10-CM | POA: Insufficient documentation

## 2012-03-05 DIAGNOSIS — M79609 Pain in unspecified limb: Secondary | ICD-10-CM | POA: Insufficient documentation

## 2012-03-07 ENCOUNTER — Encounter: Payer: Medicare PPO | Admitting: Physician Assistant

## 2012-03-07 ENCOUNTER — Ambulatory Visit (HOSPITAL_COMMUNITY)
Admit: 2012-03-07 | Discharge: 2012-03-07 | Disposition: A | Discharge status: Home / Self Care | Payer: Medicare PPO | Source: Ambulatory Visit | Attending: Physician Assistant | Admitting: Physician Assistant

## 2012-03-07 ENCOUNTER — Ambulatory Visit (INDEPENDENT_AMBULATORY_CARE_PROVIDER_SITE_OTHER): Payer: Medicare PPO | Admitting: Physician Assistant

## 2012-03-07 ENCOUNTER — Encounter: Payer: Self-pay | Admitting: Physician Assistant

## 2012-03-07 VITALS — BP 101/69 | HR 92 | Ht 70.0 in | Wt 150.4 lb

## 2012-03-07 DIAGNOSIS — R0989 Other specified symptoms and signs involving the circulatory and respiratory systems: Secondary | ICD-10-CM

## 2012-03-07 DIAGNOSIS — I959 Hypotension, unspecified: Secondary | ICD-10-CM

## 2012-03-07 DIAGNOSIS — R0600 Dyspnea, unspecified: Secondary | ICD-10-CM

## 2012-03-07 DIAGNOSIS — I10 Essential (primary) hypertension: Secondary | ICD-10-CM

## 2012-03-07 DIAGNOSIS — R0602 Shortness of breath: Secondary | ICD-10-CM | POA: Insufficient documentation

## 2012-03-07 DIAGNOSIS — R0609 Other forms of dyspnea: Secondary | ICD-10-CM

## 2012-03-07 DIAGNOSIS — I4891 Unspecified atrial fibrillation: Secondary | ICD-10-CM

## 2012-03-07 DIAGNOSIS — J90 Pleural effusion, not elsewhere classified: Secondary | ICD-10-CM | POA: Insufficient documentation

## 2012-03-07 DIAGNOSIS — I309 Acute pericarditis, unspecified: Secondary | ICD-10-CM

## 2012-03-07 MED ORDER — FUROSEMIDE 20 MG PO TABS: 20.0000 mg | ORAL_TABLET | Freq: Every day | ORAL | Status: DC

## 2012-03-07 MED ORDER — LISINOPRIL 10 MG PO TABS: 10.0000 mg | ORAL_TABLET | Freq: Every day | ORAL | Status: DC

## 2012-03-07 NOTE — Assessment & Plan Note (Signed)
Patient's blood pressure is on the low side. I will decrease her Lotensin to 10 mg daily. Hopefully this will help with her weakness. Her blood pressure will be monitored daily at the [facility].

## 2012-03-07 NOTE — Progress Notes (Signed)
HPI:   This is an 79 year old female patient who is here for post hospital followup. She was admitted to [facility] on 02/26/12 with syncope that was felt secondary to bradycardia. This is felt due to digoxin and verapamil which was given to her for rapid atrial fibrillation. She also had an echo showing a moderate free-flowing pericardial effusion. Etiology of the effusion was unclear but it was not felt that she had tamponade or would need a pericardialcentesis. Her medications were adjusted and she was discharged to the [facility] center on Lovenox Coumadin bridge.  The patient comes in today accompanied by her daughter-in-law. The patient is extremely weak and says she was running circles around everyone 3 weeks ago before all this started. She sleeps almost 24 hours a day and has no energy to do anything. She is depressed and wants to go home. She denies any dyspnea, orthopnea, palpitations, dizziness, or presyncope. She is just extremely weak. Her blood pressure is on the low side. Her O2 sat is 96%. She had blood work today and her BNP was 1285. Renal function is normal BUN 13 creatinine 0.68. BNP on 02/25/12 was 1629.  The patient has chronic atrial fibrillation, hypertension, and dementia.  No Known Allergies  Current Outpatient Prescriptions on File Prior to Visit: alendronate (FOSAMAX) 70 MG tablet, Take 70 mg by mouth every 7 (seven) days. Takes on Sunday . Take with a full glass of water on an empty stomach for the bones, Disp: , Rfl:  enoxaparin (LOVENOX) 150 MG/ML injection, Inject 0.46 mLs (70 mg total) into the skin every 12 (twelve) hours., Disp: 5 Syringe, Rfl: 0 levothyroxine (SYNTHROID, LEVOTHROID) 125 MCG tablet, Take 125 mcg by mouth daily., Disp: , Rfl:  lisinopril (PRINIVIL,ZESTRIL) 20 MG tablet, Take 1 tablet (20 mg total) by mouth daily., Disp: 30 tablet, Rfl: 3 Magnesium 250 MG TABS, Take 250 mg by mouth daily., Disp: , Rfl:  memantine (NAMENDA) 10 MG tablet, Take 10 mg by mouth  2 (two) times daily., Disp: , Rfl:  metoprolol succinate (TOPROL-XL) 12.5 mg TB24, Take 0.5 tablets (12.5 mg total) by mouth daily., Disp: 30 tablet, Rfl: 3 omeprazole (PRILOSEC) 40 MG capsule, Take 40 mg by mouth daily. For heartburn, Disp: , Rfl:  simvastatin (ZOCOR) 20 MG tablet, Take 40 mg by mouth every evening. , Disp: , Rfl:  traMADol (ULTRAM) 50 MG tablet, Take 50 mg by mouth at bedtime., Disp: , Rfl:  warfarin (COUMADIN) 5 MG tablet, Take 0.5-1 tablets (2.5-5 mg total) by mouth daily. Take 7.5 mg on 03/01/2011, then take 5mg  on Mon.,Wed.,Fri.,Sat.,Sun., and 2.5mg  on Tues.[place] Cao., Disp: 30 tablet, Rfl: 3    Past Medical History:   Hypertension                                                 Atrial fibrillation                                            Comment:Onset well before 2004; Negative stress nuclear              study in 01/2003   Osteoporosis  Acute colitis                                   09/2011         Comment:C. difficile negative   Chronic anticoagulation                                      Hyperlipidemia                                               Hiatal hernia                                                  Comment:by CT in 2013; also noted was an adrenal               adenoma, duodenal lipoma right ovarian cyst,               post hysterectomy   Pneumonia                                       09/2011         Comment:09/2011  Past Surgical History:   ABDOMINAL HYSTERECTOMY                                         Comment:No oophorectomy   APPENDECTOMY                                                 COLONOSCOPY                                     2005           Comment:Reportedly normal screening study  Review of patient's family history indicates:   Heart attack                   Father                   Cancer                         Mother                   Hypertension                   Father                     Social History   Marital Status: Widowed             Spouse Name:  Years of Education:                 Number of children:             Occupational History Occupation          Associate Professor            Comment              Retired                                   Social History Main Topics   Smoking Status: Never Smoker                     Smokeless Status: Never Used                       Alcohol Use: No             Drug Use: No             Sexual Activity: No                 Other Topics            Concern   None on file  Social History Narrative   None on file    ROS:see history of present illness otherwise negative   PHYSICAL EXAM: Well-nournished, in no acute distress, very weak. O2 sat 96% room air Neck: No JVD, HJR, Bruit, or thyroid enlargement  Lungs:decreased breath sounds at the right base otherwise clear without wheezing rales or rhonchi  Cardiovascular: irregular irregular, PMI not displaced,  no murmurs, gallops, bruit, thrill, or heave.  Abdomen: BS normal. Soft without organomegaly, masses, lesions or tenderness.  Extremities: without cyanosis, clubbing or edema. Good distal pulses bilateral  SKin: Warm, no lesions or rashes   Musculoskeletal: No deformities  Neuro: no focal signs  BP 101/69  Pulse 92  Ht 5\' 10"  (1.778 m)  Wt 150 lb 6.4 oz (68.221 kg)  BMI 21.58 kg/m2  SpO2 94%    HYQ:MVHQIO fibrillation with diffuse nonspecific ST-T wave changes no acute change

## 2012-03-07 NOTE — Assessment & Plan Note (Signed)
Atrial fibrillation is chronic and rate is controlled. Patient is on Coumadin and INR. Was 2.48 today

## 2012-03-07 NOTE — Assessment & Plan Note (Addendum)
Patient has a moderate free-flowing pericardial effusion on 2-D echo on 02/27/12. There was no evidence of hemodynamic compromise. There was no change in size of effusion compared to echo on 02/24/12. We'll continue to monitor. No evidence of compromise on exam today.I will follow up a chest x-ray today blood work next week and she will be followed closely in our office. We will schedule her to see Dr. Dietrich Pates next week.

## 2012-03-07 NOTE — Patient Instructions (Addendum)
Your physician recommends that you schedule a follow-up appointment in: 1 week with Dr Dietrich Pates  Your physician recommends that you return for lab work in: Day before appointment with Dr Dietrich Pates BMET and BNP  A chest x-ray takes a picture of the organs and structures inside the chest, including the heart, lungs, and blood vessels. This test can show several things, including, whether the heart is enlarges; whether fluid is building up in the lungs; and whether pacemaker / defibrillator leads are still in place.  Your physician has recommended you make the following change in your medication:  1 - Lasix 20 mg BID x 3 days then resume 20 mg daily thereafter

## 2012-03-21 ENCOUNTER — Encounter: Payer: Self-pay | Admitting: Cardiology

## 2012-03-21 ENCOUNTER — Ambulatory Visit (INDEPENDENT_AMBULATORY_CARE_PROVIDER_SITE_OTHER): Payer: Medicare PPO | Admitting: Cardiology

## 2012-03-21 VITALS — BP 120/78 | HR 145 | Ht 70.0 in | Wt 141.0 lb

## 2012-03-21 DIAGNOSIS — I4891 Unspecified atrial fibrillation: Secondary | ICD-10-CM

## 2012-03-21 DIAGNOSIS — I309 Acute pericarditis, unspecified: Secondary | ICD-10-CM

## 2012-03-21 DIAGNOSIS — I10 Essential (primary) hypertension: Secondary | ICD-10-CM

## 2012-03-21 DIAGNOSIS — E785 Hyperlipidemia, unspecified: Secondary | ICD-10-CM

## 2012-03-21 MED ORDER — DILTIAZEM HCL ER COATED BEADS 120 MG PO CP24: 120.0000 mg | ORAL_CAPSULE | Freq: Every day | ORAL | Status: DC

## 2012-03-21 NOTE — Assessment & Plan Note (Signed)
Pericardial effusion likely reflects a pleuropericarditis, perhaps virally mediated.  Although her symptoms are not highly suggestive of influenza, titers will be obtained.  She appears to be recovering marginally and has no evidence on exam for hemodynamic compromise.  Accordingly, repeat cardiac imaging will be deferred.

## 2012-03-21 NOTE — Assessment & Plan Note (Signed)
No elevated blood pressures recorded in recent months despite treatment with minimal antihypertensive medication.  Continued monitoring at [facility] will clarify this issue.

## 2012-03-21 NOTE — Progress Notes (Deleted)
Name: Tamara [name]    DOB: 1924-12-25  Age: 79 y.o.  MR#: [id number]       PCP:  Dwana Melena, MD      Insurance: @PAYORNAME @   CC:   No chief complaint on file.   VS BP 119/80  Pulse 105  Ht 5\' 10"  (1.778 m)  Wt 141 lb (63.957 kg)  BMI 20.23 kg/m2  SpO2 97%  Weights Current Weight  03/21/12 141 lb (63.957 kg)  03/07/12 150 lb 6.4 oz (68.221 kg)  02/25/12 150 lb 14.4 oz (68.448 kg)    Blood Pressure  BP Readings from Last 3 Encounters:  03/21/12 119/80  03/07/12 101/69  02/29/12 120/77     Admit date:  (Not on file) Last encounter with [facility]:  11/04/2011   Allergy No Known Allergies  Current Outpatient Prescriptions  Medication Sig Dispense Refill  . furosemide (LASIX) 20 MG tablet Take 1 tablet (20 mg total) by mouth daily.  30 tablet  6    Discontinued Meds:    Medications Discontinued During This Encounter  Medication Reason  . enoxaparin (LOVENOX) 150 MG/ML injection Discontinued by provider  . lisinopril (PRINIVIL,ZESTRIL) 10 MG tablet Discontinued by provider  . lisinopril (PRINIVIL,ZESTRIL) 20 MG tablet Dose change  . alendronate (FOSAMAX) 70 MG tablet Discontinued by provider    Patient Active Problem List  Diagnosis  . Atrial fibrillation  . Hypertension  . Chronic anticoagulation  . Hyperlipidemia  . Chest discomfort  . Generalized weakness  . Acute pericardial effusion  . Syncope  . Hypercalcemia  . Bradycardia  . Hyposmolality and/or hyponatremia  . Weakness  . Hypotension    LABS Admission on 02/25/2012, Discharged on 02/29/2012  Component Date Value  . WBC 02/25/2012 9.9   . RBC 02/25/2012 4.07   . Hemoglobin 02/25/2012 12.5   . HCT 02/25/2012 37.2   . MCV 02/25/2012 91.4   . [facility] 02/25/2012 30.7   . MCHC 02/25/2012 33.6   . RDW 02/25/2012 13.6   . Platelets 02/25/2012 323   . Neutrophils Relative 02/25/2012 78*  . Neutro Abs 02/25/2012 7.7   . Lymphocytes Relative 02/25/2012 10*  . Lymphs Abs 02/25/2012 1.0   . Monocytes  Relative 02/25/2012 10   . Monocytes Absolute 02/25/2012 1.0   . Eosinophils Relative 02/25/2012 1   . Eosinophils Absolute 02/25/2012 0.1   . Basophils Relative 02/25/2012 0   . Basophils Absolute 02/25/2012 0.0   . Sodium 02/25/2012 130*  . Potassium 02/25/2012 4.0   . Chloride 02/25/2012 95*  . CO2 02/25/2012 29   . Glucose, Bld 02/25/2012 128*  . BUN 02/25/2012 29*  . Creatinine, Ser 02/25/2012 0.75   . Calcium 02/25/2012 10.7*  . GFR calc non Af Amer 02/25/2012 75*  . GFR calc Af Amer 02/25/2012 87*  . Prothrombin Time 02/25/2012 27.5*  . INR 02/25/2012 2.72*  . Pro B Natriuretic peptid* 02/25/2012 1629.0*  . Total Protein 02/25/2012 6.8   . Albumin 02/25/2012 2.8*  . AST 02/25/2012 30   . ALT 02/25/2012 22   . Alkaline Phosphatase 02/25/2012 80   . Total Bilirubin 02/25/2012 0.3   . Bilirubin, Direct 02/25/2012 0.1   . Indirect Bilirubin 02/25/2012 0.2*  . Lipase 02/25/2012 50   . Color, Urine 02/25/2012 YELLOW   . APPearance 02/25/2012 CLEAR   . Specific Gravity, Urine 02/25/2012 1.020   . pH 02/25/2012 6.0   . Glucose, UA 02/25/2012 NEGATIVE   . Hgb urine dipstick 02/25/2012 TRACE*  . Bilirubin Urine  02/25/2012 NEGATIVE   . Ketones, ur 02/25/2012 NEGATIVE   . Protein, ur 02/25/2012 NEGATIVE   . Urobilinogen, UA 02/25/2012 0.2   . Nitrite 02/25/2012 NEGATIVE   . Leukocytes, UA 02/25/2012 NEGATIVE   . Digoxin Level 02/25/2012 1.1   . Troponin I 02/25/2012 <0.30   . Squamous Epithelial / LPF 02/25/2012 MANY*  . WBC, UA 02/25/2012 3-6   . RBC / HPF 02/25/2012 0-2   . Sodium 02/26/2012 137   . Potassium 02/26/2012 3.9   . Chloride 02/26/2012 99   . CO2 02/26/2012 24   . Glucose, Bld 02/26/2012 96   . BUN 02/26/2012 21   . Creatinine, Ser 02/26/2012 0.66   . Calcium 02/26/2012 10.1   . Total Protein 02/26/2012 6.3   . Albumin 02/26/2012 2.6*  . AST 02/26/2012 19   . ALT 02/26/2012 19   . Alkaline Phosphatase 02/26/2012 78   . Total Bilirubin 02/26/2012 0.5    . GFR calc non Af Amer 02/26/2012 78*  . GFR calc Af Amer 02/26/2012 >90   . Magnesium 02/26/2012 1.6   . Phosphorus 02/26/2012 3.1   . TSH 02/26/2012 2.821   . WBC 02/26/2012 8.4   . RBC 02/26/2012 4.18   . Hemoglobin 02/26/2012 12.3   . HCT 02/26/2012 38.0   . MCV 02/26/2012 90.9   . [facility] 02/26/2012 29.4   . MCHC 02/26/2012 32.4   . RDW 02/26/2012 13.7   . Platelets 02/26/2012 350   . Prothrombin Time 02/26/2012 21.1*  . INR 02/26/2012 1.90*  . Prothrombin Time 02/27/2012 18.0*  . INR 02/27/2012 1.54*  . Heparin Unfractionated 02/27/2012 0.20*  . Prothrombin Time 02/28/2012 18.3*  . INR 02/28/2012 1.57*  . WBC 02/28/2012 7.9   . RBC 02/28/2012 4.66   . Hemoglobin 02/28/2012 14.0   . HCT 02/28/2012 42.4   . MCV 02/28/2012 91.0   . [facility] 02/28/2012 30.0   . MCHC 02/28/2012 33.0   . RDW 02/28/2012 13.2   . Platelets 02/28/2012 331   . Sodium 02/28/2012 137   . Potassium 02/28/2012 3.8   . Chloride 02/28/2012 98   . CO2 02/28/2012 25   . Glucose, Bld 02/28/2012 84   . BUN 02/28/2012 15   . Creatinine, Ser 02/28/2012 0.68   . Calcium 02/28/2012 10.2   . Total Protein 02/28/2012 6.6   . Albumin 02/28/2012 2.7*  . AST 02/28/2012 27   . ALT 02/28/2012 28   . Alkaline Phosphatase 02/28/2012 99   . Total Bilirubin 02/28/2012 0.5   . GFR calc non Af Amer 02/28/2012 78*  . GFR calc Af Amer 02/28/2012 90*  . Heparin Unfractionated 02/28/2012 0.21*  . Heparin Unfractionated 02/28/2012 0.54   . Prothrombin Time 02/29/2012 18.0*  . INR 02/29/2012 1.54*  . WBC 02/29/2012 8.3   . RBC 02/29/2012 4.46   . Hemoglobin 02/29/2012 13.0   . HCT 02/29/2012 40.4   . MCV 02/29/2012 90.6   . [facility] 02/29/2012 29.1   . MCHC 02/29/2012 32.2   . RDW 02/29/2012 13.2   . Platelets 02/29/2012 341   . Heparin Unfractionated 02/29/2012 0.60   Admission on 02/04/2012, Discharged on 02/04/2012  Component Date Value  . WBC 02/04/2012 13.0*  . RBC 02/04/2012 5.06   . Hemoglobin 02/04/2012 15.7*    . HCT 02/04/2012 45.9   . MCV 02/04/2012 90.7   . [facility] 02/04/2012 31.0   . MCHC 02/04/2012 34.2   . RDW 02/04/2012 13.4   . Platelets 02/04/2012 241   .  Neutrophils Relative 02/04/2012 85*  . Neutro Abs 02/04/2012 11.1*  . Lymphocytes Relative 02/04/2012 4*  . Lymphs Abs 02/04/2012 0.5*  . Monocytes Relative 02/04/2012 11   . Monocytes Absolute 02/04/2012 1.4*  . Eosinophils Relative 02/04/2012 0   . Eosinophils Absolute 02/04/2012 0.0   . Basophils Relative 02/04/2012 0   . Basophils Absolute 02/04/2012 0.0   . Troponin I 02/04/2012 <0.30   . Sodium 02/04/2012 136   . Potassium 02/04/2012 3.4*  . Chloride 02/04/2012 97   . CO2 02/04/2012 29   . Glucose, Bld 02/04/2012 135*  . BUN 02/04/2012 12   . Creatinine, Ser 02/04/2012 0.68   . Calcium 02/04/2012 10.6*  . GFR calc non Af Amer 02/04/2012 78*  . GFR calc Af Amer 02/04/2012 90*  . D-Dimer, Quant 02/04/2012 0.30   . Pro B Natriuretic peptid* 02/04/2012 772.1*  . aPTT 02/04/2012 34   . Prothrombin Time 02/04/2012 25.8*  . INR 02/04/2012 2.50*  . Total Protein 02/04/2012 7.5   . Albumin 02/04/2012 4.0   . AST 02/04/2012 18   . ALT 02/04/2012 17   . Alkaline Phosphatase 02/04/2012 79   . Total Bilirubin 02/04/2012 0.8   . Bilirubin, Direct 02/04/2012 0.2   . Indirect Bilirubin 02/04/2012 0.6   . Lipase 02/04/2012 33   . Color, Urine 02/04/2012 YELLOW   . APPearance 02/04/2012 CLEAR   . Specific Gravity, Urine 02/04/2012 1.025   . pH 02/04/2012 6.0   . Glucose, UA 02/04/2012 NEGATIVE   . Hgb urine dipstick 02/04/2012 SMALL*  . Bilirubin Urine 02/04/2012 NEGATIVE   . Ketones, ur 02/04/2012 NEGATIVE   . Protein, ur 02/04/2012 NEGATIVE   . Urobilinogen, UA 02/04/2012 0.2   . Nitrite 02/04/2012 NEGATIVE   . Leukocytes, UA 02/04/2012 NEGATIVE   . Squamous Epithelial / LPF 02/04/2012 FEW*  . WBC, UA 02/04/2012 7-10   . RBC / HPF 02/04/2012 3-6   . Bacteria, UA 02/04/2012 RARE      Results for this Opt Visit:      Results for orders placed during the hospital encounter of 02/25/12  CBC WITH DIFFERENTIAL      Component Value Range   WBC 9.9  4.0 - 10.5 K/uL   RBC 4.07  3.87 - 5.11 MIL/uL   Hemoglobin 12.5  12.0 - 15.0 g/dL   HCT 16.1  09.6 - 04.5 %   MCV 91.4  78.0 - 100.0 fL   MCH 30.7  26.0 - 34.0 pg   MCHC 33.6  30.0 - 36.0 g/dL   RDW 40.9  81.1 - 91.4 %   Platelets 323  150 - 400 K/uL   Neutrophils Relative 78 (*) 43 - 77 %   Neutro Abs 7.7  1.7 - 7.7 K/uL   Lymphocytes Relative 10 (*) 12 - 46 %   Lymphs Abs 1.0  0.7 - 4.0 K/uL   Monocytes Relative 10  3 - 12 %   Monocytes Absolute 1.0  0.1 - 1.0 K/uL   Eosinophils Relative 1  0 - 5 %   Eosinophils Absolute 0.1  0.0 - 0.7 K/uL   Basophils Relative 0  0 - 1 %   Basophils Absolute 0.0  0.0 - 0.1 K/uL  BASIC METABOLIC PANEL      Component Value Range   Sodium 130 (*) 135 - 145 mEq/L   Potassium 4.0  3.5 - 5.1 mEq/L   Chloride 95 (*) 96 - 112 mEq/L   CO2 29  19 -  32 mEq/L   Glucose, Bld 128 (*) 70 - 99 mg/dL   BUN 29 (*) 6 - 23 mg/dL   Creatinine, Ser 1.61  0.50 - 1.10 mg/dL   Calcium 09.6 (*) 8.4 - 10.5 mg/dL   GFR calc non Af Amer 75 (*) >90 mL/min   GFR calc Af Amer 87 (*) >90 mL/min  PROTIME-INR      Component Value Range   Prothrombin Time 27.5 (*) 11.6 - 15.2 seconds   INR 2.72 (*) 0.00 - 1.49  PRO B NATRIURETIC PEPTIDE      Component Value Range   Pro B Natriuretic peptide (BNP) 1629.0 (*) 0 - 450 pg/mL  HEPATIC FUNCTION PANEL      Component Value Range   Total Protein 6.8  6.0 - 8.3 g/dL   Albumin 2.8 (*) 3.5 - 5.2 g/dL   AST 30  0 - 37 U/L   ALT 22  0 - 35 U/L   Alkaline Phosphatase 80  39 - 117 U/L   Total Bilirubin 0.3  0.3 - 1.2 mg/dL   Bilirubin, Direct 0.1  0.0 - 0.3 mg/dL   Indirect Bilirubin 0.2 (*) 0.3 - 0.9 mg/dL  LIPASE, BLOOD      Component Value Range   Lipase 50  11 - 59 U/L  URINALYSIS, ROUTINE W REFLEX MICROSCOPIC      Component Value Range   Color, Urine YELLOW  YELLOW   APPearance CLEAR  CLEAR    Specific Gravity, Urine 1.020  1.005 - 1.030   pH 6.0  5.0 - 8.0   Glucose, UA NEGATIVE  NEGATIVE mg/dL   Hgb urine dipstick TRACE (*) NEGATIVE   Bilirubin Urine NEGATIVE  NEGATIVE   Ketones, ur NEGATIVE  NEGATIVE mg/dL   Protein, ur NEGATIVE  NEGATIVE mg/dL   Urobilinogen, UA 0.2  0.0 - 1.0 mg/dL   Nitrite NEGATIVE  NEGATIVE   Leukocytes, UA NEGATIVE  NEGATIVE  DIGOXIN LEVEL      Component Value Range   Digoxin Level 1.1  0.8 - 2.0 ng/mL  TROPONIN I      Component Value Range   Troponin I <0.30  <0.30 ng/mL  URINE MICROSCOPIC-ADD ON      Component Value Range   Squamous Epithelial / LPF MANY (*) RARE   WBC, UA 3-6  <3 WBC/hpf   RBC / HPF 0-2  <3 RBC/hpf  COMPREHENSIVE METABOLIC PANEL      Component Value Range   Sodium 137  135 - 145 mEq/L   Potassium 3.9  3.5 - 5.1 mEq/L   Chloride 99  96 - 112 mEq/L   CO2 24  19 - 32 mEq/L   Glucose, Bld 96  70 - 99 mg/dL   BUN 21  6 - 23 mg/dL   Creatinine, Ser 0.45  0.50 - 1.10 mg/dL   Calcium 40.9  8.4 - 81.1 mg/dL   Total Protein 6.3  6.0 - 8.3 g/dL   Albumin 2.6 (*) 3.5 - 5.2 g/dL   AST 19  0 - 37 U/L   ALT 19  0 - 35 U/L   Alkaline Phosphatase 78  39 - 117 U/L   Total Bilirubin 0.5  0.3 - 1.2 mg/dL   GFR calc non Af Amer 78 (*) >90 mL/min   GFR calc Af Amer >90  >90 mL/min  MAGNESIUM      Component Value Range   Magnesium 1.6  1.5 - 2.5 mg/dL  PHOSPHORUS  Component Value Range   Phosphorus 3.1  2.3 - 4.6 mg/dL  TSH      Component Value Range   TSH 2.821  0.350 - 4.500 uIU/mL  CBC      Component Value Range   WBC 8.4  4.0 - 10.5 K/uL   RBC 4.18  3.87 - 5.11 MIL/uL   Hemoglobin 12.3  12.0 - 15.0 g/dL   HCT 16.1  09.6 - 04.5 %   MCV 90.9  78.0 - 100.0 fL   MCH 29.4  26.0 - 34.0 pg   MCHC 32.4  30.0 - 36.0 g/dL   RDW 40.9  81.1 - 91.4 %   Platelets 350  150 - 400 K/uL  PROTIME-INR      Component Value Range   Prothrombin Time 21.1 (*) 11.6 - 15.2 seconds   INR 1.90 (*) 0.00 - 1.49  PROTIME-INR      Component  Value Range   Prothrombin Time 18.0 (*) 11.6 - 15.2 seconds   INR 1.54 (*) 0.00 - 1.49  HEPARIN LEVEL (UNFRACTIONATED)      Component Value Range   Heparin Unfractionated 0.20 (*) 0.30 - 0.70 IU/mL  PROTIME-INR      Component Value Range   Prothrombin Time 18.3 (*) 11.6 - 15.2 seconds   INR 1.57 (*) 0.00 - 1.49  CBC      Component Value Range   WBC 7.9  4.0 - 10.5 K/uL   RBC 4.66  3.87 - 5.11 MIL/uL   Hemoglobin 14.0  12.0 - 15.0 g/dL   HCT 78.2  95.6 - 21.3 %   MCV 91.0  78.0 - 100.0 fL   MCH 30.0  26.0 - 34.0 pg   MCHC 33.0  30.0 - 36.0 g/dL   RDW 08.6  57.8 - 46.9 %   Platelets 331  150 - 400 K/uL  COMPREHENSIVE METABOLIC PANEL      Component Value Range   Sodium 137  135 - 145 mEq/L   Potassium 3.8  3.5 - 5.1 mEq/L   Chloride 98  96 - 112 mEq/L   CO2 25  19 - 32 mEq/L   Glucose, Bld 84  70 - 99 mg/dL   BUN 15  6 - 23 mg/dL   Creatinine, Ser 6.29  0.50 - 1.10 mg/dL   Calcium 52.8  8.4 - 41.3 mg/dL   Total Protein 6.6  6.0 - 8.3 g/dL   Albumin 2.7 (*) 3.5 - 5.2 g/dL   AST 27  0 - 37 U/L   ALT 28  0 - 35 U/L   Alkaline Phosphatase 99  39 - 117 U/L   Total Bilirubin 0.5  0.3 - 1.2 mg/dL   GFR calc non Af Amer 78 (*) >90 mL/min   GFR calc Af Amer 90 (*) >90 mL/min  HEPARIN LEVEL (UNFRACTIONATED)      Component Value Range   Heparin Unfractionated 0.21 (*) 0.30 - 0.70 IU/mL  HEPARIN LEVEL (UNFRACTIONATED)      Component Value Range   Heparin Unfractionated 0.54  0.30 - 0.70 IU/mL  PROTIME-INR      Component Value Range   Prothrombin Time 18.0 (*) 11.6 - 15.2 seconds   INR 1.54 (*) 0.00 - 1.49  CBC      Component Value Range   WBC 8.3  4.0 - 10.5 K/uL   RBC 4.46  3.87 - 5.11 MIL/uL   Hemoglobin 13.0  12.0 - 15.0 g/dL   HCT 24.4  01.0 -  46.0 %   MCV 90.6  78.0 - 100.0 fL   MCH 29.1  26.0 - 34.0 pg   MCHC 32.2  30.0 - 36.0 g/dL   RDW 16.1  09.6 - 04.5 %   Platelets 341  150 - 400 K/uL  HEPARIN LEVEL (UNFRACTIONATED)      Component Value Range   Heparin  Unfractionated 0.60  0.30 - 0.70 IU/mL    EKG Orders placed in visit on 03/21/12  . EKG 12-LEAD     Prior Assessment and Plan Problem List as of 03/21/2012            Cardiology Problems   Atrial fibrillation   Last Assessment & Plan Note   03/07/2012 Office Visit Signed 03/07/2012 12:56 PM by Dyann Kief, PA    Atrial fibrillation is chronic and rate is controlled. Patient is on Coumadin and INR. Was 2.48 today    Hypertension   Last Assessment & Plan Note   02/16/2012 Office Visit Addendum 02/16/2012  3:01 PM by Jodelle Gross, NP    Remains on verapamil. BP low normal today. Will not increase dose for better HR control due to soft BP. Can consider decreasing the HCTZ portion of lisinopril combo if BP remains low. As she is in atrial fibrillation, accuracy of BP readings is in question.    Hyperlipidemia   Last Assessment & Plan Note   10/28/2011 Office Visit Signed 10/28/2011  2:59 PM by Kathlen Brunswick, MD    No recent lipid profile available.  Due to advanced age and absence of vascular disease, careful monitoring of hyperlipidemia does not appear absolutely necessary.    Acute pericardial effusion   Last Assessment & Plan Note   03/07/2012 Office Visit Addendum 03/07/2012 12:59 PM by Dyann Kief, PA    Patient has a moderate free-flowing pericardial effusion on 2-D echo on 02/27/12. There was no evidence of hemodynamic compromise. There was no change in size of effusion compared to echo on 02/24/12. We'll continue to monitor. No evidence of compromise on exam today.I will follow up a chest x-ray today blood work next week and she will be followed closely in our office. We will schedule her to see Dr. Dietrich Pates next week.    Syncope   Hypotension   Last Assessment & Plan Note   03/07/2012 Office Visit Signed 03/07/2012 12:57 PM by Dyann Kief, PA    Patient's blood pressure is on the low side. I will decrease her Lotensin to 10 mg daily. Hopefully this will help with her  weakness. Her blood pressure will be monitored daily at the [facility].      Other   Chronic anticoagulation   Last Assessment & Plan Note   10/28/2011 Office Visit Signed 10/28/2011  2:59 PM by Kathlen Brunswick, MD    Patient has done well with long-term warfarin treatment.  We discussed alternative oral agents, but she wishes to continue current therapy.  We will monitor for occult GI blood loss.    Chest discomfort   Last Assessment & Plan Note   02/16/2012 Office Visit Signed 02/16/2012  2:58 PM by Jodelle Gross, NP    Unclear of etiology at this point. She was ruled out for MI in ER.   However she continues to have this at rest. EKG shows nonspecific T-wave abnormalities. I will plan a lexiscan nuclear study for evaluation of ischemia causing discomfort. Cannot rule out unstable angina. Echocardiogram for LV fx and pulmonary  pressures will be completed. She is on CCB, which will assist with vasospasms and questionable pulmonary hypertension, although this has not been diagnosed. She was very active until this past August when she had a severe illness requiring a lengthy hospitalization.     Generalized weakness   Hypercalcemia   Bradycardia   Hyposmolality and/or hyponatremia   Weakness       Imaging: Dg Chest 1 View  03/07/2012  *RADIOLOGY REPORT*  Clinical Data: Dyspnea.  Weakness and shortness of breath.  CHEST - 1 VIEW  Comparison: Chest radiograph 03/05/2012  Findings: Cardiomegaly is stable.  Ectatic/tortuous contour of the ascending thoracic aorta is stable.  Small bilateral pleural effusions and bibasilar atelectasis are unchanged.  The upper and mid lung fields remain well expanded and clear.  IMPRESSION: Small bilateral pleural effusions and bibasilar atelectasis.  No significant change compared to chest radiograph of 03/05/2012.   Original Report Authenticated By: Britta Mccreedy, M.D.    Dg Chest 1 View  03/05/2012  *RADIOLOGY REPORT*  Clinical Data: CHF.  CHEST - 1 VIEW   Comparison: 02/27/2012.  Findings: Mild cardiac enlargement, slightly improved.  There are small persistent pleural effusions but no pulmonary edema. Bibasilar atelectasis but no infiltrates.  No pneumothorax.  IMPRESSION: Small pleural effusions and overlying atelectasis.  No edema or infiltrate.   Original Report Authenticated By: Rudie Meyer, M.D.    Dg Chest 2 View  02/27/2012  *RADIOLOGY REPORT*  Clinical Data: Follow up of cardiomegaly.  Cough.  Shortness of breath.  Hypertension.  Hiatal hernia.  CHEST - 2 VIEW  Comparison: 02/25/2012  Findings: Mild osteopenia.  Moderate cardiomegaly.  A small left pleural effusion is similar to slightly increased.  No right-sided pleural effusion. No pneumothorax.  No congestive failure.  There is bibasilar volume loss.  On the lateral view, elevation of the left hemidiaphragm with underlying subdiaphragmatic colonic gas identified.  IMPRESSION: Cardiomegaly with a small left pleural effusion and bibasilar volume loss/atelectasis.   Original Report Authenticated By: Jeronimo Greaves, M.D.    Dg Shoulder Right  03/05/2012  *RADIOLOGY REPORT*  Clinical Data: Right shoulder pain  RIGHT SHOULDER - 2+ VIEW  Comparison: None.  Findings: Three views of the right shoulder submitted.  No acute fracture or subluxation.  Mild degenerative changes right AC joint. Mild spurring of the humeral head.  IMPRESSION: No acute fracture or subluxation.  Mild degenerative changes.   Original Report Authenticated By: Natasha Mead, M.D.    Dg Chest Portable 1 View  02/25/2012  *RADIOLOGY REPORT*  Clinical Data: Weakness and cough  PORTABLE CHEST - 1 VIEW  Comparison: 02/09/2012 CT, 02/04/2012 chest radiograph  Findings: Interval increase in now moderate enlargement of the cardiomediastinal silhouette is noted with patchy bibasilar consolidation.  No pleural effusion.  Mildly diffusely prominent residual markings are again noted.  No acute osseous finding.  IMPRESSION: Increase in now moderate  cardiomegaly with patchy bibasilar airspace opacities which could indicate early edema or atelectasis versus pneumonia.  If the patient is thought to be fluid overloaded, consider follow-up after diuresis if clinically appropriate.   Original Report Authenticated By: Christiana Pellant, M.D.    Dg Humerus Right  03/05/2012  *RADIOLOGY REPORT*  Clinical Data: Pain.  No reported injury.  RIGHT HUMERUS - 2+ VIEW  Comparison:  None.  Findings: There is no evidence of fracture or other focal bone lesions.  Soft tissues are unremarkable.Mild skeletal osteopenia is suspected.  IMPRESSION: Negative for fracture.   Original Report Authenticated By: Davonna Belling,  M.D.      Luna Glasgow Calculation: Score not calculated

## 2012-03-21 NOTE — Progress Notes (Signed)
Patient ID: Tamara [name], female   DOB: [birth year], 79 y.o.   MRN: [id number]  HPI: Scheduled return visit for this nice woman who is being followed closely after recent hospitalization with a moderate-sized pericardial effusion, bilateral pleural effusions and pulmonary parenchymal abnormalities.  She denies fever, chills or sputum production.  Cough has been noticed intermittently in recent weeks.  She recalls no blood transfusions, but the chart indicates administration of fresh frozen plasma in the past.  She remains at [facility] where she was sent following hospitalization.  Modest improvement has been noted, but she continues to experience extreme weakness and poor exercise tolerance.  She notes anorexia and decrease caloric intake.  Patient also describes diarrhea, but staff denies that this has been a problem.  Prior to Admission medications   Medication Sig Start Date End Date Taking? Authorizing Provider  escitalopram (LEXAPRO) 10 MG tablet Take 10 mg by mouth daily.   Yes Historical Provider, MD  levothyroxine (SYNTHROID, LEVOTHROID) 125 MCG tablet Take 125 mcg by mouth daily.   Yes Historical Provider, MD  memantine (NAMENDA) 10 MG tablet Take 10 mg by mouth 2 (two) times daily.   Yes Historical Provider, MD  omeprazole (PRILOSEC) 40 MG capsule Take 40 mg by mouth daily. For heartburn   Yes Historical Provider, MD  warfarin (COUMADIN) 5 MG tablet Take 0.5-1 tablets (2.5-5 mg total) by mouth daily. Take 7.5 mg on 03/01/2011, then take 5mg  on Mon.,Wed.,Fri.,Sat.,Sun., and 2.5mg  on Tues.,Thur. 02/29/12  Yes Roger A Arguello, PA-C  diltiazem (CARDIZEM CD) 120 MG 24 hr capsule Take 1 capsule (120 mg total) by mouth daily. 03/21/12   Kathlen Brunswick, MD  No Known Allergies    Past medical history, social history, and family history reviewed and updated.  ROS: Denies nausea although daughter-in-law recalls an episode of recent emesis; patient's review of systems is of questionable  validity.  She denies orthopnea, PND, lightheadedness or syncope.  She notes intermittent dyspnea not necessarily related to activity.  She has a history of dementia, but mentation has been worse in recent weeks.  All other systems reviewed and are negative.  PHYSICAL EXAM: BP 120/78  Pulse 145  Ht 5\' 10"  (1.778 m)  Wt 63.957 kg (141 lb)  BMI 20.23 kg/m2  SpO2 88% ; weight has decreased 9 pounds in 4 weeks; no paradox on BP determination General-Well developed; no acute distress Body habitus-underweight Neck-No JVD; no carotid bruits Lungs-clear lung fields; resonant to percussion Cardiovascular-normal PMI; normal S1 and S2 Abdomen-normal bowel sounds; soft and non-tender without masses or organomegaly Musculoskeletal-No deformities, no cyanosis or clubbing Neurologic-Normal cranial nerves; symmetric strength and tone Skin-Warm, no significant lesions Extremities-distal pulses intact; no edema  EKG: atrial flutter with variable AV conduction; low voltage; occasional PVC versus aberrant conduction; nonspecific ST-T wave abnormality.  No previous tracing for comparison  ASSESSMENT AND PLAN:  [place] Bing, MD 03/21/2012 7:51 PM

## 2012-03-21 NOTE — Assessment & Plan Note (Signed)
No lipid profile available.  In the absence of known vascular disease, pharmacologic therapy may not be necessary.  To simplify assessment of her current symptoms, medications or not currently absolutely necessary, including simvastatin, will be discontinued.

## 2012-03-21 NOTE — Assessment & Plan Note (Addendum)
Heart rate is currently adequately controlled; INRs have been subtherapeutic and need to be monitored more closely.  Patient's daughter-in-law is convinced that digoxin treatment led to her acute illness.  I believe that this was coincidental or the digoxin was added because of increased heart rate that was associated with her acute illness, but treatment with digoxin is probably not necessary.  That drug and other extraneous medications including furosemide, lisinopril, magnesium, simvastatin and metoprolol will be discontinued with diltiazem utilized for control of heart rate.

## 2012-03-21 NOTE — Assessment & Plan Note (Signed)
Minimal elevation during acute illness; now normal.

## 2012-03-21 NOTE — Patient Instructions (Signed)
Your physician recommends that you schedule a follow-up appointment in: 10 days  Your physician recommends that you return for lab work in:  1 - Stools x 3 for hemoccult 2 - Stool for Cdiff 3 - Urinalysis with c&s 4 - Obtain cmet,cbc,influenza A&B,HIV,RPR and B 12 in 1 week  Ensure 1 can BID  Your physician has recommended you make the following change in your medication:  1 - STOP Lasix 2 - STOP Lisinopril 3 - STOP Magnesium 4 - STOP Metoprolol 5 - STOP Simvastatin 6 - STOP Digoxin 7 - STOP Ul;tram 8 - START Diltiazem 120 mg daily

## 2012-03-23 ENCOUNTER — Encounter: Payer: Self-pay | Admitting: Cardiology

## 2012-04-02 ENCOUNTER — Encounter: Payer: Self-pay | Admitting: Cardiology

## 2012-04-02 ENCOUNTER — Ambulatory Visit (HOSPITAL_COMMUNITY)
Admit: 2012-04-02 | Discharge: 2012-04-02 | Disposition: A | Discharge status: Home / Self Care | Payer: Medicare PPO | Attending: Internal Medicine | Admitting: Internal Medicine

## 2012-04-02 ENCOUNTER — Ambulatory Visit (INDEPENDENT_AMBULATORY_CARE_PROVIDER_SITE_OTHER): Payer: Medicare PPO | Admitting: Cardiology

## 2012-04-02 VITALS — BP 153/85 | HR 108 | Ht 70.0 in | Wt 142.0 lb

## 2012-04-02 DIAGNOSIS — I309 Acute pericarditis, unspecified: Secondary | ICD-10-CM

## 2012-04-02 DIAGNOSIS — I517 Cardiomegaly: Secondary | ICD-10-CM | POA: Insufficient documentation

## 2012-04-02 DIAGNOSIS — Z7901 Long term (current) use of anticoagulants: Secondary | ICD-10-CM

## 2012-04-02 DIAGNOSIS — I10 Essential (primary) hypertension: Secondary | ICD-10-CM

## 2012-04-02 DIAGNOSIS — J90 Pleural effusion, not elsewhere classified: Secondary | ICD-10-CM | POA: Insufficient documentation

## 2012-04-02 DIAGNOSIS — I4891 Unspecified atrial fibrillation: Secondary | ICD-10-CM

## 2012-04-02 NOTE — Patient Instructions (Addendum)
Your physician recommends that you schedule a follow-up appointment in: ONE MONTH WITH RR  Your physician has recommended you make the following change in your medication:   1) INCREASE DILTIAZEM CD TO 180MG  ONCE DAILY    Your physician has requested that you have an echocardiogram. Echocardiography is a painless test that uses sound waves to create images of your heart. It provides your doctor with information about the size and shape of your heart and how well your heart's chambers and valves are working. This procedure takes approximately one hour. There are no restrictions for this procedure IN THREE WEEKS

## 2012-04-02 NOTE — Progress Notes (Deleted)
Name: Tamara [name]    DOB: 1924/11/24  Age: 79 y.o.  MR#: [id number]       PCP:  Dwana Melena, MD      Insurance: @PAYORNAME @   CC:   LIST REQUESTED ALL LABS TO BE SENT FROM [facility]   VS BP 153/85  Pulse 108  Ht 5\' 10"  (1.778 m)  Wt 142 lb (64.411 kg)  BMI 20.37 kg/m2  Weights Current Weight  04/02/12 142 lb (64.411 kg)  03/21/12 141 lb (63.957 kg)  03/07/12 150 lb 6.4 oz (68.221 kg)    Blood Pressure  BP Readings from Last 3 Encounters:  04/02/12 153/85  03/21/12 120/78  03/07/12 101/69     Admit date:  (Not on file) Last encounter with RMR:  03/21/2012   Allergy No Known Allergies  Current Outpatient Prescriptions  Medication Sig Dispense Refill  . diltiazem (CARDIZEM CD) 120 MG 24 hr capsule Take 1 capsule (120 mg total) by mouth daily.  90 capsule  3   No current facility-administered medications for this visit.    Discontinued Meds:   There are no discontinued medications.  Patient Active Problem List  Diagnosis  . Atrial fibrillation  . Hypertension  . Chronic anticoagulation  . Hyperlipidemia  . Acute pericardial effusion  . Hypercalcemia    LABS Admission on 02/25/2012, Discharged on 02/29/2012  Component Date Value  . WBC 02/25/2012 9.9   . RBC 02/25/2012 4.07   . Hemoglobin 02/25/2012 12.5   . HCT 02/25/2012 37.2   . MCV 02/25/2012 91.4   . [facility] 02/25/2012 30.7   . MCHC 02/25/2012 33.6   . RDW 02/25/2012 13.6   . Platelets 02/25/2012 323   . Neutrophils Relative 02/25/2012 78*  . Neutro Abs 02/25/2012 7.7   . Lymphocytes Relative 02/25/2012 10*  . Lymphs Abs 02/25/2012 1.0   . Monocytes Relative 02/25/2012 10   . Monocytes Absolute 02/25/2012 1.0   . Eosinophils Relative 02/25/2012 1   . Eosinophils Absolute 02/25/2012 0.1   . Basophils Relative 02/25/2012 0   . Basophils Absolute 02/25/2012 0.0   . Sodium 02/25/2012 130*  . Potassium 02/25/2012 4.0   . Chloride 02/25/2012 95*  . CO2 02/25/2012 29   . Glucose, Bld 02/25/2012  128*  . BUN 02/25/2012 29*  . Creatinine, Ser 02/25/2012 0.75   . Calcium 02/25/2012 10.7*  . GFR calc non Af Amer 02/25/2012 75*  . GFR calc Af Amer 02/25/2012 87*  . Prothrombin Time 02/25/2012 27.5*  . INR 02/25/2012 2.72*  . Pro B Natriuretic peptid* 02/25/2012 1629.0*  . Total Protein 02/25/2012 6.8   . Albumin 02/25/2012 2.8*  . AST 02/25/2012 30   . ALT 02/25/2012 22   . Alkaline Phosphatase 02/25/2012 80   . Total Bilirubin 02/25/2012 0.3   . Bilirubin, Direct 02/25/2012 0.1   . Indirect Bilirubin 02/25/2012 0.2*  . Lipase 02/25/2012 50   . Color, Urine 02/25/2012 YELLOW   . APPearance 02/25/2012 CLEAR   . Specific Gravity, Urine 02/25/2012 1.020   . pH 02/25/2012 6.0   . Glucose, UA 02/25/2012 NEGATIVE   . Hgb urine dipstick 02/25/2012 TRACE*  . Bilirubin Urine 02/25/2012 NEGATIVE   . Ketones, ur 02/25/2012 NEGATIVE   . Protein, ur 02/25/2012 NEGATIVE   . Urobilinogen, UA 02/25/2012 0.2   . Nitrite 02/25/2012 NEGATIVE   . Leukocytes, UA 02/25/2012 NEGATIVE   . Digoxin Level 02/25/2012 1.1   . Troponin I 02/25/2012 <0.30   . Squamous Epithelial / LPF 02/25/2012  MANY*  . WBC, UA 02/25/2012 3-6   . RBC / HPF 02/25/2012 0-2   . Sodium 02/26/2012 137   . Potassium 02/26/2012 3.9   . Chloride 02/26/2012 99   . CO2 02/26/2012 24   . Glucose, Bld 02/26/2012 96   . BUN 02/26/2012 21   . Creatinine, Ser 02/26/2012 0.66   . Calcium 02/26/2012 10.1   . Total Protein 02/26/2012 6.3   . Albumin 02/26/2012 2.6*  . AST 02/26/2012 19   . ALT 02/26/2012 19   . Alkaline Phosphatase 02/26/2012 78   . Total Bilirubin 02/26/2012 0.5   . GFR calc non Af Amer 02/26/2012 78*  . GFR calc Af Amer 02/26/2012 >90   . Magnesium 02/26/2012 1.6   . Phosphorus 02/26/2012 3.1   . TSH 02/26/2012 2.821   . WBC 02/26/2012 8.4   . RBC 02/26/2012 4.18   . Hemoglobin 02/26/2012 12.3   . HCT 02/26/2012 38.0   . MCV 02/26/2012 90.9   . [facility] 02/26/2012 29.4   . MCHC 02/26/2012 32.4   . RDW  02/26/2012 13.7   . Platelets 02/26/2012 350   . Prothrombin Time 02/26/2012 21.1*  . INR 02/26/2012 1.90*  . Prothrombin Time 02/27/2012 18.0*  . INR 02/27/2012 1.54*  . Heparin Unfractionated 02/27/2012 0.20*  . Prothrombin Time 02/28/2012 18.3*  . INR 02/28/2012 1.57*  . WBC 02/28/2012 7.9   . RBC 02/28/2012 4.66   . Hemoglobin 02/28/2012 14.0   . HCT 02/28/2012 42.4   . MCV 02/28/2012 91.0   . [facility] 02/28/2012 30.0   . MCHC 02/28/2012 33.0   . RDW 02/28/2012 13.2   . Platelets 02/28/2012 331   . Sodium 02/28/2012 137   . Potassium 02/28/2012 3.8   . Chloride 02/28/2012 98   . CO2 02/28/2012 25   . Glucose, Bld 02/28/2012 84   . BUN 02/28/2012 15   . Creatinine, Ser 02/28/2012 0.68   . Calcium 02/28/2012 10.2   . Total Protein 02/28/2012 6.6   . Albumin 02/28/2012 2.7*  . AST 02/28/2012 27   . ALT 02/28/2012 28   . Alkaline Phosphatase 02/28/2012 99   . Total Bilirubin 02/28/2012 0.5   . GFR calc non Af Amer 02/28/2012 78*  . GFR calc Af Amer 02/28/2012 90*  . Heparin Unfractionated 02/28/2012 0.21*  . Heparin Unfractionated 02/28/2012 0.54   . Prothrombin Time 02/29/2012 18.0*  . INR 02/29/2012 1.54*  . WBC 02/29/2012 8.3   . RBC 02/29/2012 4.46   . Hemoglobin 02/29/2012 13.0   . HCT 02/29/2012 40.4   . MCV 02/29/2012 90.6   . [facility] 02/29/2012 29.1   . MCHC 02/29/2012 32.2   . RDW 02/29/2012 13.2   . Platelets 02/29/2012 341   . Heparin Unfractionated 02/29/2012 0.60   Admission on 02/04/2012, Discharged on 02/04/2012  Component Date Value  . WBC 02/04/2012 13.0*  . RBC 02/04/2012 5.06   . Hemoglobin 02/04/2012 15.7*  . HCT 02/04/2012 45.9   . MCV 02/04/2012 90.7   . [facility] 02/04/2012 31.0   . MCHC 02/04/2012 34.2   . RDW 02/04/2012 13.4   . Platelets 02/04/2012 241   . Neutrophils Relative 02/04/2012 85*  . Neutro Abs 02/04/2012 11.1*  . Lymphocytes Relative 02/04/2012 4*  . Lymphs Abs 02/04/2012 0.5*  . Monocytes Relative 02/04/2012 11   . Monocytes  Absolute 02/04/2012 1.4*  . Eosinophils Relative 02/04/2012 0   . Eosinophils Absolute 02/04/2012 0.0   . Basophils Relative 02/04/2012 0   .  Basophils Absolute 02/04/2012 0.0   . Troponin I 02/04/2012 <0.30   . Sodium 02/04/2012 136   . Potassium 02/04/2012 3.4*  . Chloride 02/04/2012 97   . CO2 02/04/2012 29   . Glucose, Bld 02/04/2012 135*  . BUN 02/04/2012 12   . Creatinine, Ser 02/04/2012 0.68   . Calcium 02/04/2012 10.6*  . GFR calc non Af Amer 02/04/2012 78*  . GFR calc Af Amer 02/04/2012 90*  . D-Dimer, Quant 02/04/2012 0.30   . Pro B Natriuretic peptid* 02/04/2012 772.1*  . aPTT 02/04/2012 34   . Prothrombin Time 02/04/2012 25.8*  . INR 02/04/2012 2.50*  . Total Protein 02/04/2012 7.5   . Albumin 02/04/2012 4.0   . AST 02/04/2012 18   . ALT 02/04/2012 17   . Alkaline Phosphatase 02/04/2012 79   . Total Bilirubin 02/04/2012 0.8   . Bilirubin, Direct 02/04/2012 0.2   . Indirect Bilirubin 02/04/2012 0.6   . Lipase 02/04/2012 33   . Color, Urine 02/04/2012 YELLOW   . APPearance 02/04/2012 CLEAR   . Specific Gravity, Urine 02/04/2012 1.025   . pH 02/04/2012 6.0   . Glucose, UA 02/04/2012 NEGATIVE   . Hgb urine dipstick 02/04/2012 SMALL*  . Bilirubin Urine 02/04/2012 NEGATIVE   . Ketones, ur 02/04/2012 NEGATIVE   . Protein, ur 02/04/2012 NEGATIVE   . Urobilinogen, UA 02/04/2012 0.2   . Nitrite 02/04/2012 NEGATIVE   . Leukocytes, UA 02/04/2012 NEGATIVE   . Squamous Epithelial / LPF 02/04/2012 FEW*  . WBC, UA 02/04/2012 7-10   . RBC / HPF 02/04/2012 3-6   . Bacteria, UA 02/04/2012 RARE      Results for this Opt Visit:     Results for orders placed during the hospital encounter of 02/25/12  CBC WITH DIFFERENTIAL      Result Value Range   WBC 9.9  4.0 - 10.5 K/uL   RBC 4.07  3.87 - 5.11 MIL/uL   Hemoglobin 12.5  12.0 - 15.0 g/dL   HCT 16.1  09.6 - 04.5 %   MCV 91.4  78.0 - 100.0 fL   MCH 30.7  26.0 - 34.0 pg   MCHC 33.6  30.0 - 36.0 g/dL   RDW 40.9  81.1 -  91.4 %   Platelets 323  150 - 400 K/uL   Neutrophils Relative 78 (*) 43 - 77 %   Neutro Abs 7.7  1.7 - 7.7 K/uL   Lymphocytes Relative 10 (*) 12 - 46 %   Lymphs Abs 1.0  0.7 - 4.0 K/uL   Monocytes Relative 10  3 - 12 %   Monocytes Absolute 1.0  0.1 - 1.0 K/uL   Eosinophils Relative 1  0 - 5 %   Eosinophils Absolute 0.1  0.0 - 0.7 K/uL   Basophils Relative 0  0 - 1 %   Basophils Absolute 0.0  0.0 - 0.1 K/uL  BASIC METABOLIC PANEL      Result Value Range   Sodium 130 (*) 135 - 145 mEq/L   Potassium 4.0  3.5 - 5.1 mEq/L   Chloride 95 (*) 96 - 112 mEq/L   CO2 29  19 - 32 mEq/L   Glucose, Bld 128 (*) 70 - 99 mg/dL   BUN 29 (*) 6 - 23 mg/dL   Creatinine, Ser 7.82  0.50 - 1.10 mg/dL   Calcium 95.6 (*) 8.4 - 10.5 mg/dL   GFR calc non Af Amer 75 (*) >90 mL/min   GFR calc Af Amer 87 (*) >  90 mL/min  PROTIME-INR      Result Value Range   Prothrombin Time 27.5 (*) 11.6 - 15.2 seconds   INR 2.72 (*) 0.00 - 1.49  PRO B NATRIURETIC PEPTIDE      Result Value Range   Pro B Natriuretic peptide (BNP) 1629.0 (*) 0 - 450 pg/mL  HEPATIC FUNCTION PANEL      Result Value Range   Total Protein 6.8  6.0 - 8.3 g/dL   Albumin 2.8 (*) 3.5 - 5.2 g/dL   AST 30  0 - 37 U/L   ALT 22  0 - 35 U/L   Alkaline Phosphatase 80  39 - 117 U/L   Total Bilirubin 0.3  0.3 - 1.2 mg/dL   Bilirubin, Direct 0.1  0.0 - 0.3 mg/dL   Indirect Bilirubin 0.2 (*) 0.3 - 0.9 mg/dL  LIPASE, BLOOD      Result Value Range   Lipase 50  11 - 59 U/L  URINALYSIS, ROUTINE W REFLEX MICROSCOPIC      Result Value Range   Color, Urine YELLOW  YELLOW   APPearance CLEAR  CLEAR   Specific Gravity, Urine 1.020  1.005 - 1.030   pH 6.0  5.0 - 8.0   Glucose, UA NEGATIVE  NEGATIVE mg/dL   Hgb urine dipstick TRACE (*) NEGATIVE   Bilirubin Urine NEGATIVE  NEGATIVE   Ketones, ur NEGATIVE  NEGATIVE mg/dL   Protein, ur NEGATIVE  NEGATIVE mg/dL   Urobilinogen, UA 0.2  0.0 - 1.0 mg/dL   Nitrite NEGATIVE  NEGATIVE   Leukocytes, UA NEGATIVE   NEGATIVE  DIGOXIN LEVEL      Result Value Range   Digoxin Level 1.1  0.8 - 2.0 ng/mL  TROPONIN I      Result Value Range   Troponin I <0.30  <0.30 ng/mL  URINE MICROSCOPIC-ADD ON      Result Value Range   Squamous Epithelial / LPF MANY (*) RARE   WBC, UA 3-6  <3 WBC/hpf   RBC / HPF 0-2  <3 RBC/hpf  COMPREHENSIVE METABOLIC PANEL      Result Value Range   Sodium 137  135 - 145 mEq/L   Potassium 3.9  3.5 - 5.1 mEq/L   Chloride 99  96 - 112 mEq/L   CO2 24  19 - 32 mEq/L   Glucose, Bld 96  70 - 99 mg/dL   BUN 21  6 - 23 mg/dL   Creatinine, Ser 1.61  0.50 - 1.10 mg/dL   Calcium 09.6  8.4 - 04.5 mg/dL   Total Protein 6.3  6.0 - 8.3 g/dL   Albumin 2.6 (*) 3.5 - 5.2 g/dL   AST 19  0 - 37 U/L   ALT 19  0 - 35 U/L   Alkaline Phosphatase 78  39 - 117 U/L   Total Bilirubin 0.5  0.3 - 1.2 mg/dL   GFR calc non Af Amer 78 (*) >90 mL/min   GFR calc Af Amer >90  >90 mL/min  MAGNESIUM      Result Value Range   Magnesium 1.6  1.5 - 2.5 mg/dL  PHOSPHORUS      Result Value Range   Phosphorus 3.1  2.3 - 4.6 mg/dL  TSH      Result Value Range   TSH 2.821  0.350 - 4.500 uIU/mL  CBC      Result Value Range   WBC 8.4  4.0 - 10.5 K/uL   RBC 4.18  3.87 - 5.11 MIL/uL  Hemoglobin 12.3  12.0 - 15.0 g/dL   HCT 16.1  09.6 - 04.5 %   MCV 90.9  78.0 - 100.0 fL   MCH 29.4  26.0 - 34.0 pg   MCHC 32.4  30.0 - 36.0 g/dL   RDW 40.9  81.1 - 91.4 %   Platelets 350  150 - 400 K/uL  PROTIME-INR      Result Value Range   Prothrombin Time 21.1 (*) 11.6 - 15.2 seconds   INR 1.90 (*) 0.00 - 1.49  PROTIME-INR      Result Value Range   Prothrombin Time 18.0 (*) 11.6 - 15.2 seconds   INR 1.54 (*) 0.00 - 1.49  HEPARIN LEVEL (UNFRACTIONATED)      Result Value Range   Heparin Unfractionated 0.20 (*) 0.30 - 0.70 IU/mL  PROTIME-INR      Result Value Range   Prothrombin Time 18.3 (*) 11.6 - 15.2 seconds   INR 1.57 (*) 0.00 - 1.49  CBC      Result Value Range   WBC 7.9  4.0 - 10.5 K/uL   RBC 4.66  3.87 -  5.11 MIL/uL   Hemoglobin 14.0  12.0 - 15.0 g/dL   HCT 78.2  95.6 - 21.3 %   MCV 91.0  78.0 - 100.0 fL   MCH 30.0  26.0 - 34.0 pg   MCHC 33.0  30.0 - 36.0 g/dL   RDW 08.6  57.8 - 46.9 %   Platelets 331  150 - 400 K/uL  COMPREHENSIVE METABOLIC PANEL      Result Value Range   Sodium 137  135 - 145 mEq/L   Potassium 3.8  3.5 - 5.1 mEq/L   Chloride 98  96 - 112 mEq/L   CO2 25  19 - 32 mEq/L   Glucose, Bld 84  70 - 99 mg/dL   BUN 15  6 - 23 mg/dL   Creatinine, Ser 6.29  0.50 - 1.10 mg/dL   Calcium 52.8  8.4 - 41.3 mg/dL   Total Protein 6.6  6.0 - 8.3 g/dL   Albumin 2.7 (*) 3.5 - 5.2 g/dL   AST 27  0 - 37 U/L   ALT 28  0 - 35 U/L   Alkaline Phosphatase 99  39 - 117 U/L   Total Bilirubin 0.5  0.3 - 1.2 mg/dL   GFR calc non Af Amer 78 (*) >90 mL/min   GFR calc Af Amer 90 (*) >90 mL/min  HEPARIN LEVEL (UNFRACTIONATED)      Result Value Range   Heparin Unfractionated 0.21 (*) 0.30 - 0.70 IU/mL  HEPARIN LEVEL (UNFRACTIONATED)      Result Value Range   Heparin Unfractionated 0.54  0.30 - 0.70 IU/mL  PROTIME-INR      Result Value Range   Prothrombin Time 18.0 (*) 11.6 - 15.2 seconds   INR 1.54 (*) 0.00 - 1.49  CBC      Result Value Range   WBC 8.3  4.0 - 10.5 K/uL   RBC 4.46  3.87 - 5.11 MIL/uL   Hemoglobin 13.0  12.0 - 15.0 g/dL   HCT 24.4  01.0 - 27.2 %   MCV 90.6  78.0 - 100.0 fL   MCH 29.1  26.0 - 34.0 pg   MCHC 32.2  30.0 - 36.0 g/dL   RDW 53.6  64.4 - 03.4 %   Platelets 341  150 - 400 K/uL  HEPARIN LEVEL (UNFRACTIONATED)      Result Value Range  Heparin Unfractionated 0.60  0.30 - 0.70 IU/mL    EKG Orders placed in visit on 04/02/12  . EKG 12-LEAD     Prior Assessment and Plan Problem List as of 04/02/2012     ICD-9-CM   Atrial fibrillation   Last Assessment & Plan   03/21/2012 Office Visit Edited 03/21/2012  8:04 PM by Kathlen Brunswick, MD     Heart rate is currently adequately controlled; INRs have been subtherapeutic and need to be monitored more closely.   Patient's daughter-in-law is convinced that digoxin treatment led to her acute illness.  I believe that this was coincidental or the digoxin was added because of increased heart rate that was associated with her acute illness, but treatment with digoxin is probably not necessary.  That drug and other extraneous medications including furosemide, lisinopril, magnesium, simvastatin and metoprolol will be discontinued with diltiazem utilized for control of heart rate.    Hypertension   Last Assessment & Plan   03/21/2012 Office Visit Written 03/21/2012  8:08 PM by Kathlen Brunswick, MD     No elevated blood pressures recorded in recent months despite treatment with minimal antihypertensive medication.  Continued monitoring at [facility]F will clarify this issue.    Chronic anticoagulation   Last Assessment & Plan   10/28/2011 Office Visit Written 10/28/2011  2:59 PM by Kathlen Brunswick, MD     Patient has done well with long-term warfarin treatment.  We discussed alternative oral agents, but she wishes to continue current therapy.  We will monitor for occult GI blood loss.    Hyperlipidemia   Last Assessment & Plan   03/21/2012 Office Visit Written 03/21/2012  8:06 PM by Kathlen Brunswick, MD     No lipid profile available.  In the absence of known vascular disease, pharmacologic therapy may not be necessary.  To simplify assessment of her current symptoms, medications or not currently absolutely necessary, including simvastatin, will be discontinued.     Acute pericardial effusion   Last Assessment & Plan   03/21/2012 Office Visit Written 03/21/2012  8:00 PM by Kathlen Brunswick, MD     Pericardial effusion likely reflects a pleuropericarditis, perhaps virally mediated.  Although her symptoms are not highly suggestive of influenza, titers will be obtained.  She appears to be recovering marginally and has no evidence on exam for hemodynamic compromise.  Accordingly, repeat cardiac imaging will be deferred.     Hypercalcemia   Last Assessment & Plan   03/21/2012 Office Visit Written 03/21/2012  8:05 PM by Kathlen Brunswick, MD     Minimal elevation during acute illness; now normal.        Imaging: Dg Chest 1 View  03/07/2012  *RADIOLOGY REPORT*  Clinical Data: Dyspnea.  Weakness and shortness of breath.  CHEST - 1 VIEW  Comparison: Chest radiograph 03/05/2012  Findings: Cardiomegaly is stable.  Ectatic/tortuous contour of the ascending thoracic aorta is stable.  Small bilateral pleural effusions and bibasilar atelectasis are unchanged.  The upper and mid lung fields remain well expanded and clear.  IMPRESSION: Small bilateral pleural effusions and bibasilar atelectasis.  No significant change compared to chest radiograph of 03/05/2012.   Original Report Authenticated By: Britta Mccreedy, M.D.    Dg Chest 1 View  03/05/2012  *RADIOLOGY REPORT*  Clinical Data: CHF.  CHEST - 1 VIEW  Comparison: 02/27/2012.  Findings: Mild cardiac enlargement, slightly improved.  There are small persistent pleural effusions but no pulmonary edema. Bibasilar atelectasis but no infiltrates.  No pneumothorax.  IMPRESSION: Small pleural effusions and overlying atelectasis.  No edema or infiltrate.   Original Report Authenticated By: Rudie Meyer, M.D.    Dg Chest 2 View  04/02/2012  *RADIOLOGY REPORT*  Clinical Data: Pericardial effusion  CHEST - 2 VIEW  Comparison: 03/07/2012  Findings: Cardiomegaly.  No frank interstitial edema.  Small bilateral pleural effusions.  Associated lower lobe opacities, likely atelectasis.  Degenerative changes of the visualized thoracolumbar spine.  IMPRESSION: Cardiomegaly with small bilateral pleural effusions.  Associated lower lobe opacities, likely atelectasis.   Original Report Authenticated By: Charline Bills, M.D.    Dg Shoulder Right  03/05/2012  *RADIOLOGY REPORT*  Clinical Data: Right shoulder pain  RIGHT SHOULDER - 2+ VIEW  Comparison: None.  Findings: Three views of the right shoulder  submitted.  No acute fracture or subluxation.  Mild degenerative changes right AC joint. Mild spurring of the humeral head.  IMPRESSION: No acute fracture or subluxation.  Mild degenerative changes.   Original Report Authenticated By: Natasha Mead, M.D.    Dg Humerus Right  03/05/2012  *RADIOLOGY REPORT*  Clinical Data: Pain.  No reported injury.  RIGHT HUMERUS - 2+ VIEW  Comparison:  None.  Findings: There is no evidence of fracture or other focal bone lesions.  Soft tissues are unremarkable.Mild skeletal osteopenia is suspected.  IMPRESSION: Negative for fracture.   Original Report Authenticated By: Davonna Belling, M.D.      [facility] Calculation: Score not calculated

## 2012-04-03 ENCOUNTER — Encounter: Payer: Self-pay | Admitting: Cardiology

## 2012-04-03 NOTE — Assessment & Plan Note (Addendum)
Stable and therapeutic INRs; recent (02/2012) positive FOBT obtained by Dr. Leanord Hawking at [facility].

## 2012-04-03 NOTE — Assessment & Plan Note (Signed)
Patient is asymptomatic, but heart rate control is suboptimal.  Diltiazem dosage will be increased as needed to maintain heart rate less than 100 bpm.

## 2012-04-03 NOTE — Progress Notes (Signed)
Patient ID: Tamara [name], female   DOB: January 19, 1925, 79 y.o.   MRN: [id number]  HPI: Scheduled return visit for this very sharp and very nice older woman with atrial fibrillation requiring anticoagulation, hyperlipidemia and hypertension.  Since her last visit, she has done extremely well.  She reports no cardiopulmonary symptoms despite remaining quite active for her age.  She has had no overt bleeding and no evidence for occult blood loss.  She has not been hospitalized or required urgent medical care.  Prior to Admission medications   Medication Sig Start Date End Date Taking? Authorizing Provider  alendronate (FOSAMAX) 70 MG tablet Take 70 mg by mouth every 7 (seven) days. Take with a full glass of water on an empty stomach.   Yes Historical Provider, MD  amoxicillin (AMOXIL) 500 MG tablet Take 500 mg by mouth 2 (two) times daily.   Yes Historical Provider, MD  diltiazem (CARDIZEM CD) 180 MG 24 hr capsule Take 180 mg by mouth daily.   Yes Historical Provider, MD  ENSURE (ENSURE) Take 237 mLs by mouth.   Yes Historical Provider, MD  escitalopram (LEXAPRO) 10 MG tablet Take 10 mg by mouth daily.   Yes Historical Provider, MD  levothyroxine (SYNTHROID, LEVOTHROID) 125 MCG tablet Take 125 mcg by mouth daily.   Yes Historical Provider, MD  memantine (NAMENDA) 10 MG tablet Take 10 mg by mouth 2 (two) times daily.   Yes Historical Provider, MD  omeprazole (PRILOSEC) 40 MG capsule Take 40 mg by mouth daily. For heartburn   Yes Historical Provider, MD  warfarin (COUMADIN) 5 MG tablet Take 0.5-1 tablets (2.5-5 mg total) by mouth daily. Take 7.5 mg on 03/01/2011, then take 5mg  on Mon.,Wed.,Fri.,Sat.,Sun., and 2.5mg  on Tues.,Thur. 02/29/12  Yes Roger A Arguello, PA-C  No Known Allergies    Past medical history, social history, and family history reviewed and updated.  ROS: Denies orthopnea, PND, palpitations, lightheadedness, syncope or chest pain.  All other systems reviewed and are negative.  PHYSICAL  EXAM: BP 153/85  Pulse 108  Ht 5\' 10"  (1.778 m)  Wt 64.411 kg (142 lb)  BMI 20.37 kg/m2; repeat BP-143/67, no paradox General-Well developed; no acute distress Body habitus-proportionate weight and height Neck-No JVD; no carotid bruits Lungs-clear lung fields; resonant to percussion Cardiovascular-normal PMI; normal S1 and S2; rapid and irregular rhythm-apical rate of 100 bpm Abdomen-normal bowel sounds; soft and non-tender without masses or organomegaly Musculoskeletal-No deformities, no cyanosis or clubbing Neurologic-Normal cranial nerves; symmetric strength and tone Skin-Warm, no significant lesions Extremities-distal pulses intact; trace edema of the right ankle, 1/2+ on the left  EKG: Atrial fibrillation with rapid ventricular response; ventricular rate of 115 bpm; low voltage; delayed R-wave progression; nonspecific T-wave abnormality.  No previous tracing for comparison.  ASSESSMENT AND PLAN:  [place] Bing, MD 04/03/2012 8:35 PM

## 2012-04-03 NOTE — Assessment & Plan Note (Signed)
Multiple blood pressure determinations entirely normal over the past 2 months; however, minimal elevation in systolic pressure today.  We will continue to monitor.

## 2012-04-03 NOTE — Assessment & Plan Note (Signed)
Elevated blood pressure and absence of paradox-no clinical evidence for tamponade.  Repeat echocardiogram will be obtained in 3 weeks.

## 2012-04-03 NOTE — Assessment & Plan Note (Signed)
Most recent calcium in 03/2012 normal at 9.8 with PTH at the upper limit of normal.

## 2012-04-09 NOTE — Progress Notes (Signed)
Patient ID: Tamara [name], female   DOB: 1924-10-12, 79 y.o.   MRN: [id number]  Urine culture results return to our office with >  100,000 enterococcus.  Call placed to [facility].  Staff Kathie Rhodes) verified that antibiotic treatment had been ordered and provided to this patient.

## 2012-04-12 ENCOUNTER — Encounter: Payer: Self-pay | Admitting: Cardiology

## 2012-04-23 ENCOUNTER — Ambulatory Visit (HOSPITAL_COMMUNITY)
Admit: 2012-04-23 | Discharge: 2012-04-23 | Disposition: A | Discharge status: Home / Self Care | Payer: Medicare PPO | Source: Ambulatory Visit | Attending: Cardiology | Admitting: Cardiology

## 2012-04-23 DIAGNOSIS — E785 Hyperlipidemia, unspecified: Secondary | ICD-10-CM | POA: Insufficient documentation

## 2012-04-23 DIAGNOSIS — I319 Disease of pericardium, unspecified: Secondary | ICD-10-CM

## 2012-04-23 DIAGNOSIS — I10 Essential (primary) hypertension: Secondary | ICD-10-CM | POA: Insufficient documentation

## 2012-04-23 DIAGNOSIS — I4891 Unspecified atrial fibrillation: Secondary | ICD-10-CM | POA: Insufficient documentation

## 2012-04-23 NOTE — Progress Notes (Signed)
*  PRELIMINARY RESULTS* Echocardiogram 2D Echocardiogram has been performed.  Tamara [name] 04/23/2012, 11:45 AM

## 2012-05-14 ENCOUNTER — Other Ambulatory Visit: Payer: Self-pay | Admitting: Geriatric Medicine

## 2012-05-14 MED ORDER — ESCITALOPRAM OXALATE 10 MG PO TABS: 10.0000 mg | ORAL_TABLET | Freq: Every day | ORAL | Status: DC

## 2012-05-14 MED ORDER — OMEPRAZOLE 40 MG PO CPDR: 40.0000 mg | DELAYED_RELEASE_CAPSULE | Freq: Every day | ORAL | Status: AC

## 2012-05-14 MED ORDER — LEVOTHYROXINE SODIUM 125 MCG PO TABS: 125.0000 ug | ORAL_TABLET | Freq: Every day | ORAL | Status: DC

## 2012-05-14 MED ORDER — ALENDRONATE SODIUM 70 MG PO TABS: 70.0000 mg | ORAL_TABLET | ORAL | Status: DC

## 2012-05-14 MED ORDER — DILTIAZEM HCL ER COATED BEADS 180 MG PO CP24: 180.0000 mg | ORAL_CAPSULE | Freq: Every day | ORAL | Status: DC

## 2012-05-22 ENCOUNTER — Encounter: Payer: Self-pay | Admitting: Cardiology

## 2012-05-22 ENCOUNTER — Ambulatory Visit (INDEPENDENT_AMBULATORY_CARE_PROVIDER_SITE_OTHER): Payer: Medicare PPO | Admitting: Cardiology

## 2012-05-22 VITALS — BP 110/60 | HR 68 | Ht 70.0 in | Wt 142.0 lb

## 2012-05-22 DIAGNOSIS — I309 Acute pericarditis, unspecified: Secondary | ICD-10-CM

## 2012-05-22 DIAGNOSIS — Z7901 Long term (current) use of anticoagulants: Secondary | ICD-10-CM

## 2012-05-22 DIAGNOSIS — I4891 Unspecified atrial fibrillation: Secondary | ICD-10-CM

## 2012-05-22 DIAGNOSIS — I10 Essential (primary) hypertension: Secondary | ICD-10-CM

## 2012-05-22 NOTE — Patient Instructions (Addendum)
Your physician recommends that you schedule a follow-up appointment in: 3 months  Low Salt Diet  Elevate Legs

## 2012-05-22 NOTE — Progress Notes (Deleted)
Name: Tamara [name]    DOB: [birth year]  Age: 79 y.o.  MR#: [id number]       PCP:  Dwana Melena, MD      Insurance: Payor: Francine Graven MEDICARE  Plan: HUMANA MEDICARE CHOICE PPO  Product Type: *No Product type*    CC:   No chief complaint on file.   VS Filed Vitals:   05/22/12 1306  BP: 110/60  Pulse: 68  Height: 5\' 10"  (1.778 m)  Weight: 142 lb (64.411 kg)  SpO2: 96%    Weights Current Weight  05/22/12 142 lb (64.411 kg)  04/02/12 142 lb (64.411 kg)  03/21/12 141 lb (63.957 kg)    Blood Pressure  BP Readings from Last 3 Encounters:  05/22/12 110/60  04/02/12 153/85  03/21/12 120/78     Admit date:  (Not on file) Last encounter with RMR:  04/02/2012   Allergy Review of patient's allergies indicates no known allergies.  Current Outpatient Prescriptions  Medication Sig Dispense Refill  . alendronate (FOSAMAX) 70 MG tablet Take 1 tablet (70 mg total) by mouth every 7 (seven) days. Take with a full glass of water on an empty stomach.  4 tablet  5  . diltiazem (TIAZAC) 120 MG 24 hr capsule Take 120 mg by mouth daily.      Marland Kitchen ENSURE (ENSURE) Take 237 mLs by mouth.      . escitalopram (LEXAPRO) 10 MG tablet Take 1 tablet (10 mg total) by mouth daily.  30 tablet  3  . levothyroxine (SYNTHROID, LEVOTHROID) 112 MCG tablet Take 112 mcg by mouth daily before breakfast.      . memantine (NAMENDA) 10 MG tablet Take 10 mg by mouth 2 (two) times daily.      Marland Kitchen omeprazole (PRILOSEC) 40 MG capsule Take 1 capsule (40 mg total) by mouth daily. For heartburn  30 capsule  3  . potassium chloride (K-DUR,KLOR-CON) 10 MEQ tablet Take 10 mEq by mouth daily.      Marland Kitchen warfarin (COUMADIN) 5 MG tablet Take 0.5-1 tablets (2.5-5 mg total) by mouth daily. Take 7.5 mg on 03/01/2011, then take 5mg  on Mon.,Wed.,Fri.,Sat.,Sun., and 2.5mg  on Tues.,Thur.  30 tablet  3   No current facility-administered medications for this visit.    Discontinued Meds:    Medications Discontinued During This Encounter  Medication  Reason  . diltiazem (CARDIZEM CD) 180 MG 24 hr capsule Error  . levothyroxine (SYNTHROID, LEVOTHROID) 125 MCG tablet Error  . amoxicillin (AMOXIL) 500 MG tablet Error    Patient Active Problem List  Diagnosis  . Atrial fibrillation  . Hypertension  . Chronic anticoagulation  . Hyperlipidemia  . Acute pericardial effusion  . Hypercalcemia    LABS    Component Value Date/Time   NA 137 02/28/2012 0535   NA 137 02/26/2012 0650   NA 130* 02/25/2012 1518   K 3.8 02/28/2012 0535   K 3.9 02/26/2012 0650   K 4.0 02/25/2012 1518   CL 98 02/28/2012 0535   CL 99 02/26/2012 0650   CL 95* 02/25/2012 1518   CO2 25 02/28/2012 0535   CO2 24 02/26/2012 0650   CO2 29 02/25/2012 1518   GLUCOSE 84 02/28/2012 0535   GLUCOSE 96 02/26/2012 0650   GLUCOSE 128* 02/25/2012 1518   BUN 15 02/28/2012 0535   BUN 21 02/26/2012 0650   BUN 29* 02/25/2012 1518   CREATININE 0.68 02/28/2012 0535   CREATININE 0.66 02/26/2012 0650   CREATININE 0.75 02/25/2012 1518   CREATININE 0.93 10/28/2011 1441  CALCIUM 10.2 02/28/2012 0535   CALCIUM 10.1 02/26/2012 0650   CALCIUM 10.7* 02/25/2012 1518   GFRNONAA 78* 02/28/2012 0535   GFRNONAA 78* 02/26/2012 0650   GFRNONAA 75* 02/25/2012 1518   GFRAA 90* 02/28/2012 0535   GFRAA >90 02/26/2012 0650   GFRAA 87* 02/25/2012 1518   CMP     Component Value Date/Time   NA 137 02/28/2012 0535   K 3.8 02/28/2012 0535   CL 98 02/28/2012 0535   CO2 25 02/28/2012 0535   GLUCOSE 84 02/28/2012 0535   BUN 15 02/28/2012 0535   CREATININE 0.68 02/28/2012 0535   CREATININE 0.93 10/28/2011 1441   CALCIUM 10.2 02/28/2012 0535   PROT 6.6 02/28/2012 0535   ALBUMIN 2.7* 02/28/2012 0535   AST 27 02/28/2012 0535   ALT 28 02/28/2012 0535   ALKPHOS 99 02/28/2012 0535   BILITOT 0.5 02/28/2012 0535   GFRNONAA 78* 02/28/2012 0535   GFRAA 90* 02/28/2012 0535       Component Value Date/Time   WBC 8.3 02/29/2012 0510   WBC 7.9 02/28/2012 0535   WBC 8.4 02/26/2012 0650   HGB 13.0 02/29/2012 0510   HGB 14.0 02/28/2012 0535   HGB 12.3 02/26/2012 0650   HCT 40.4 02/29/2012 0510    HCT 42.4 02/28/2012 0535   HCT 38.0 02/26/2012 0650   MCV 90.6 02/29/2012 0510   MCV 91.0 02/28/2012 0535   MCV 90.9 02/26/2012 0650    Lipid Panel  No results found for this basename: chol, trig, hdl, cholhdl, vldl, ldlcalc    ABG    Component Value Date/Time   PHART 7.319* 09/27/2011 2228   PCO2ART 32.3* 09/27/2011 2228   PO2ART 231.0* 09/27/2011 2228   HCO3 16.1* 09/27/2011 2228   TCO2 14.7 09/27/2011 2228   ACIDBASEDEF 8.8* 09/27/2011 2228   O2SAT 99.3 09/27/2011 2228     Lab Results  Component Value Date   TSH 2.821 02/26/2012   BNP (last 3 results)  Recent Labs  02/04/12 1100 02/25/12 1620  PROBNP 772.1* 1629.0*   Cardiac Panel (last 3 results) No results found for this basename: CKTOTAL, CKMB, TROPONINI, RELINDX,  in the last 72 hours  Iron/TIBC/Ferritin No results found for this basename: iron, tibc, ferritin     EKG Orders placed in visit on 04/02/12  . EKG 12-LEAD     Prior Assessment and Plan Problem List as of 05/22/2012     ICD-9-CM     Cardiology Problems   Atrial fibrillation   Last Assessment & Plan   04/02/2012 Office Visit Written 04/03/2012  8:53 PM by Kathlen Brunswick, MD     Patient is asymptomatic, but heart rate control is suboptimal.  Diltiazem dosage will be increased as needed to maintain heart rate less than 100 bpm.    Hypertension   Last Assessment & Plan   04/02/2012 Office Visit Written 04/03/2012  8:52 PM by Kathlen Brunswick, MD     Multiple blood pressure determinations entirely normal over the past 2 months; however, minimal elevation in systolic pressure today.  We will continue to monitor.    Hyperlipidemia   Last Assessment & Plan   03/21/2012 Office Visit Written 03/21/2012  8:06 PM by Kathlen Brunswick, MD     No lipid profile available.  In the absence of known vascular disease, pharmacologic therapy may not be necessary.  To simplify assessment of her current symptoms, medications or not currently absolutely necessary, including simvastatin,  will be discontinued.  Acute pericardial effusion   Last Assessment & Plan   04/02/2012 Office Visit Written 04/03/2012  8:54 PM by Kathlen Brunswick, MD     Elevated blood pressure and absence of paradox-no clinical evidence for tamponade.  Repeat echocardiogram will be obtained in 3 weeks.      Other   Chronic anticoagulation   Last Assessment & Plan   04/02/2012 Office Visit Edited 04/04/2012 11:57 AM by Kathlen Brunswick, MD     Stable and therapeutic INRs; recent (02/2012) positive FOBT obtained by Dr. Leanord Hawking at [facility].    Hypercalcemia   Last Assessment & Plan   04/02/2012 Office Visit Written 04/03/2012  8:50 PM by Kathlen Brunswick, MD     Most recent calcium in 03/2012 normal at 9.8 with PTH at the upper limit of normal.        Imaging: No results found.

## 2012-05-23 NOTE — Assessment & Plan Note (Signed)
Pericardial effusion resolved as of early 04/2012.

## 2012-05-23 NOTE — Assessment & Plan Note (Signed)
Patient has no symptoms attributable to atrial fibrillation. Heart rate is well-controlled. Effective prophylaxis of thromboembolism with warfarin.

## 2012-05-23 NOTE — Assessment & Plan Note (Addendum)
Blood pressure control is excellent, and current medications will be continued. Pedal edema will be addressed with salt restriction and leg elevation for now.

## 2012-05-23 NOTE — Progress Notes (Signed)
Patient ID: Tamara [name], female   DOB: Feb 07, 1925, 79 y.o.   MRN: [id number]  HPI: Schedule return visit for this lovely older woman with atrial fibrillation and hypertension. Since her last visit, she is continued to improve noting increased energy and increased activity. Nonetheless, she naps during the day and reports difficulty with insomnia at night. Pedal edema is only mildly uncomfortable for her. Current Outpatient Prescriptions  Medication Sig Dispense Refill  . alendronate (FOSAMAX) 70 MG tablet Take 1 tablet (70 mg total) by mouth every 7 (seven) days. Take with a full glass of water on an empty stomach.  4 tablet  5  . diltiazem (TIAZAC) 120 MG 24 hr capsule Take 120 mg by mouth daily.      Marland Kitchen ENSURE (ENSURE) Take 237 mLs by mouth.      . escitalopram (LEXAPRO) 10 MG tablet Take 1 tablet (10 mg total) by mouth daily.  30 tablet  3  . levothyroxine (SYNTHROID, LEVOTHROID) 112 MCG tablet Take 112 mcg by mouth daily before breakfast.      . memantine (NAMENDA) 10 MG tablet Take 10 mg by mouth 2 (two) times daily.      Marland Kitchen omeprazole (PRILOSEC) 40 MG capsule Take 1 capsule (40 mg total) by mouth daily. For heartburn  30 capsule  3  . potassium chloride (K-DUR,KLOR-CON) 10 MEQ tablet Take 10 mEq by mouth daily.      Marland Kitchen warfarin (COUMADIN) 5 MG tablet Take 0.5-1 tablets (2.5-5 mg total) by mouth daily. Take 7.5 mg on 03/01/2011, then take 5mg  on Mon.,Wed.,Fri.,Sat.,Sun., and 2.5mg  on Tues.,Thur.  30 tablet  3   No current facility-administered medications for this visit.   No Known Allergies   Past medical history, social history, and family history reviewed and updated.  ROS: Denies chest pain, dyspnea, orthopnea or PND. Appetite is good, but she has not gained weight. All other systems reviewed and are negative.  PHYSICAL EXAM: BP 110/60  Pulse 68  Ht 5\' 10"  (1.778 m)  Wt 64.411 kg (142 lb)  BMI 20.37 kg/m2  SpO2 96%;  Body mass index is 20.37 kg/(m^2). General-Well developed; no  acute distress Body habitus-proportionate weight and height Neck-No JVD; no carotid bruits Lungs-clear lung fields; resonant to percussion; mild kyphosis Cardiovascular-normal PMI; split S1 and normal S2 Abdomen-normal bowel sounds; soft and non-tender without masses or organomegaly Musculoskeletal-No deformities, no cyanosis or clubbing Neurologic-Normal cranial nerves; symmetric strength and tone Skin-Warm, no significant lesions Extremities-distal pulses intact; 1+ ankle edema bilaterally   Bing, MD 05/23/2012  8:41 AM  ASSESSMENT AND PLAN

## 2012-05-23 NOTE — Assessment & Plan Note (Signed)
Warfarin; monitored by Dr. Leanord Hawking at [facility] as of 02/2012

## 2012-08-16 ENCOUNTER — Ambulatory Visit (INDEPENDENT_AMBULATORY_CARE_PROVIDER_SITE_OTHER): Payer: Medicare PPO | Admitting: Cardiology

## 2012-08-16 ENCOUNTER — Encounter: Payer: Self-pay | Admitting: Cardiology

## 2012-08-16 VITALS — BP 128/68 | HR 64 | Ht 70.0 in | Wt 151.8 lb

## 2012-08-16 DIAGNOSIS — M1990 Unspecified osteoarthritis, unspecified site: Secondary | ICD-10-CM

## 2012-08-16 DIAGNOSIS — I10 Essential (primary) hypertension: Secondary | ICD-10-CM

## 2012-08-16 DIAGNOSIS — Z7901 Long term (current) use of anticoagulants: Secondary | ICD-10-CM

## 2012-08-16 DIAGNOSIS — I4891 Unspecified atrial fibrillation: Secondary | ICD-10-CM

## 2012-08-16 DIAGNOSIS — I309 Acute pericarditis, unspecified: Secondary | ICD-10-CM

## 2012-08-16 MED ORDER — DICLOFENAC SODIUM 1 % TD GEL: 4.0000 g | Freq: Three times a day (TID) | TRANSDERMAL | Status: DC | PRN

## 2012-08-16 NOTE — Progress Notes (Signed)
Patient ID: Tamara [name], female   DOB: 11-11-1924, 79 y.o.   MRN: [id number]  HPI: Scheduled return visit for this nice woman with a history of pericardial effusion that has resolved and now with persistent atrial fibrillation requiring anticoagulation. Since her last visit, she has continued to improve. She is moved from the [facility] to [facility]. She moves around independently with her walker without difficulty and without dyspnea or chest discomfort. Appetite has improved. Her major complaint now is of right knee pain.  Current Outpatient Prescriptions  Medication Sig Dispense Refill  . alendronate (FOSAMAX) 70 MG tablet Take 1 tablet (70 mg total) by mouth every 7 (seven) days. Take with a full glass of water on an empty stomach.  4 tablet  5  . diltiazem (TIAZAC) 120 MG 24 hr capsule Take 120 mg by mouth daily.      Marland Kitchen ENSURE (ENSURE) Take 237 mLs by mouth.      . escitalopram (LEXAPRO) 10 MG tablet Take 1 tablet (10 mg total) by mouth daily.  30 tablet  3  . levothyroxine (SYNTHROID, LEVOTHROID) 112 MCG tablet Take 112 mcg by mouth daily before breakfast.      . memantine (NAMENDA) 10 MG tablet Take 10 mg by mouth 2 (two) times daily.      Marland Kitchen omeprazole (PRILOSEC) 40 MG capsule Take 1 capsule (40 mg total) by mouth daily. For heartburn  30 capsule  3  . Polyethyl Glycol-Propyl Glycol (SYSTANE OP) Apply to eye 2 (two) times daily. 1 drop into both eyes      . potassium chloride (K-DUR,KLOR-CON) 10 MEQ tablet Take 10 mEq by mouth daily.      Marland Kitchen warfarin (COUMADIN) 5 MG tablet Take 0.5-1 tablets (2.5-5 mg total) by mouth daily. Take 7.5 mg on 03/01/2011, then take 5mg  on Mon.,Wed.,Fri.,Sat.,Sun., and 2.5mg  on Tues.,Thur.  30 tablet  3  . diltiazem (CARDIZEM) 120 MG tablet       . potassium chloride (K-DUR) 10 MEQ tablet        No current facility-administered medications for this visit.   No Known Allergies   Past medical history, social history, and family history reviewed and  updated.  ROS: Denies palpitations, lightheadedness or syncope. She has had no falls. All other systems reviewed and are negative.  PHYSICAL EXAM: BP 128/68  Pulse 64  Ht 5\' 10"  (1.778 m)  Wt 68.856 kg (151 lb 12.8 oz)  BMI 21.78 kg/m2;  Body mass index is 21.78 kg/(m^2). General-Well developed; no acute distress Body habitus-proportionate weight and height Neck-No JVD; no carotid bruits Lungs-clear lung fields; resonant to percussion; + kyphosis  Cardiovascular-normal PMI; normal S1 and S2; irregular rhythm; modest systolic ejection murmur Abdomen-normal bowel sounds; soft and non-tender without masses or organomegaly Musculoskeletal-, no cyanosis or clubbing; right knee-minor swelling without erythema or warmth; full range of motion with discomfort; nontender. Neurologic-Normal cranial nerves; symmetric strength and tone Skin-Warm, no significant lesions Extremities-distal pulses intact; trace edema  [place] Bing, MD 08/16/2012  2:31 PM  ASSESSMENT AND PLAN

## 2012-08-16 NOTE — Patient Instructions (Addendum)
Your physician recommends that you schedule a follow-up appointment in: 8 MONTHS WITH DR K  Your Physician recommends that you complete the Hemoccult package given to you today, instructions are enclosed in your packet. Once completed please return to this office.  Your physician recommends THAT YOU STOP DRINKING ENSURE  Your physician recommends that you return for lab work in: IN 4 MONTHS FOR CBC Your physician recommends that you have follow up lab work, we will mail you a reminder letter to alert you when to go [employer], located across the street from our office.   Your physician has recommended you make the following change in your medication:   1) START VOLTAREN GEL 1%  (TAKE 4 GM THREE TIMES DAILY/AS NEEDED TO RIGHT KNEE

## 2012-08-16 NOTE — Progress Notes (Deleted)
Name: Tamara [name]    DOB: 1924/08/13  Age: 79 y.o.  MR#: [id number]       PCP:  Catalina Pizza, MD      Insurance: Payor: [email or web address] MEDICARE / Plan: HUMANA MEDICARE CHOICE PPO / Product Type: *No Product type* /   CC:   No chief complaint on file. MAR FROM FACILITY  VS Filed Vitals:   08/16/12 1352  BP: 128/68  Pulse: 64  Height: 5\' 10"  (1.778 m)  Weight: 151 lb 12.8 oz (68.856 kg)    Weights Current Weight  08/16/12 151 lb 12.8 oz (68.856 kg)  05/22/12 142 lb (64.411 kg)  04/02/12 142 lb (64.411 kg)    Blood Pressure  BP Readings from Last 3 Encounters:  08/16/12 128/68  05/22/12 110/60  04/02/12 153/85     Admit date:  (Not on file) Last encounter with RMR:  05/22/2012   Allergy Review of patient's allergies indicates no known allergies.  Current Outpatient Prescriptions  Medication Sig Dispense Refill  . alendronate (FOSAMAX) 70 MG tablet Take 1 tablet (70 mg total) by mouth every 7 (seven) days. Take with a full glass of water on an empty stomach.  4 tablet  5  . diltiazem (TIAZAC) 120 MG 24 hr capsule Take 120 mg by mouth daily.      Marland Kitchen ENSURE (ENSURE) Take 237 mLs by mouth.      . escitalopram (LEXAPRO) 10 MG tablet Take 1 tablet (10 mg total) by mouth daily.  30 tablet  3  . levothyroxine (SYNTHROID, LEVOTHROID) 112 MCG tablet Take 112 mcg by mouth daily before breakfast.      . memantine (NAMENDA) 10 MG tablet Take 10 mg by mouth 2 (two) times daily.      Marland Kitchen omeprazole (PRILOSEC) 40 MG capsule Take 1 capsule (40 mg total) by mouth daily. For heartburn  30 capsule  3  . Polyethyl Glycol-Propyl Glycol (SYSTANE OP) Apply to eye 2 (two) times daily. 1 drop into both eyes      . potassium chloride (K-DUR,KLOR-CON) 10 MEQ tablet Take 10 mEq by mouth daily.      Marland Kitchen warfarin (COUMADIN) 5 MG tablet Take 0.5-1 tablets (2.5-5 mg total) by mouth daily. Take 7.5 mg on 03/01/2011, then take 5mg  on Mon.,Wed.,Fri.,Sat.,Sun., and 2.5mg  on Tues.,Thur.  30 tablet  3  . diltiazem (CARDIZEM)  120 MG tablet       . potassium chloride (K-DUR) 10 MEQ tablet        No current facility-administered medications for this visit.    Discontinued Meds:   There are no discontinued medications.  Patient Active Problem List   Diagnosis Date Noted  . Hypercalcemia 02/29/2012  . Acute pericardial effusion 02/25/2012  . Hypertension   . Chronic anticoagulation   . Hyperlipidemia   . Atrial fibrillation 09/28/2011    LABS    Component Value Date/Time   NA 137 02/28/2012 0535   NA 137 02/26/2012 0650   NA 130* 02/25/2012 1518   K 3.8 02/28/2012 0535   K 3.9 02/26/2012 0650   K 4.0 02/25/2012 1518   CL 98 02/28/2012 0535   CL 99 02/26/2012 0650   CL 95* 02/25/2012 1518   CO2 25 02/28/2012 0535   CO2 24 02/26/2012 0650   CO2 29 02/25/2012 1518   GLUCOSE 84 02/28/2012 0535   GLUCOSE 96 02/26/2012 0650   GLUCOSE 128* 02/25/2012 1518   BUN 15 02/28/2012 0535   BUN 21 02/26/2012 0650   BUN  29* 02/25/2012 1518   CREATININE 0.68 02/28/2012 0535   CREATININE 0.66 02/26/2012 0650   CREATININE 0.75 02/25/2012 1518   CREATININE 0.93 10/28/2011 1441   CALCIUM 10.2 02/28/2012 0535   CALCIUM 10.1 02/26/2012 0650   CALCIUM 10.7* 02/25/2012 1518   GFRNONAA 78* 02/28/2012 0535   GFRNONAA 78* 02/26/2012 0650   GFRNONAA 75* 02/25/2012 1518   GFRAA 90* 02/28/2012 0535   GFRAA >90 02/26/2012 0650   GFRAA 87* 02/25/2012 1518   CMP     Component Value Date/Time   NA 137 02/28/2012 0535   K 3.8 02/28/2012 0535   CL 98 02/28/2012 0535   CO2 25 02/28/2012 0535   GLUCOSE 84 02/28/2012 0535   BUN 15 02/28/2012 0535   CREATININE 0.68 02/28/2012 0535   CREATININE 0.93 10/28/2011 1441   CALCIUM 10.2 02/28/2012 0535   PROT 6.6 02/28/2012 0535   ALBUMIN 2.7* 02/28/2012 0535   AST 27 02/28/2012 0535   ALT 28 02/28/2012 0535   ALKPHOS 99 02/28/2012 0535   BILITOT 0.5 02/28/2012 0535   GFRNONAA 78* 02/28/2012 0535   GFRAA 90* 02/28/2012 0535       Component Value Date/Time   WBC 8.3 02/29/2012 0510   WBC 7.9 02/28/2012 0535   WBC 8.4 02/26/2012 0650   HGB 13.0 02/29/2012 0510    HGB 14.0 02/28/2012 0535   HGB 12.3 02/26/2012 0650   HCT 40.4 02/29/2012 0510   HCT 42.4 02/28/2012 0535   HCT 38.0 02/26/2012 0650   MCV 90.6 02/29/2012 0510   MCV 91.0 02/28/2012 0535   MCV 90.9 02/26/2012 0650    Lipid Panel  No results found for this basename: chol, trig, hdl, cholhdl, vldl, ldlcalc    ABG    Component Value Date/Time   PHART 7.319* 09/27/2011 2228   PCO2ART 32.3* 09/27/2011 2228   PO2ART 231.0* 09/27/2011 2228   HCO3 16.1* 09/27/2011 2228   TCO2 14.7 09/27/2011 2228   ACIDBASEDEF 8.8* 09/27/2011 2228   O2SAT 99.3 09/27/2011 2228     Lab Results  Component Value Date   TSH 2.821 02/26/2012   BNP (last 3 results)  Recent Labs  02/04/12 1100 02/25/12 1620  PROBNP 772.1* 1629.0*   Cardiac Panel (last 3 results) No results found for this basename: CKTOTAL, CKMB, TROPONINI, RELINDX,  in the last 72 hours  Iron/TIBC/Ferritin No results found for this basename: iron, tibc, ferritin     EKG Orders placed in visit on 04/02/12  . EKG 12-LEAD     Prior Assessment and Plan Problem List as of 08/16/2012   Atrial fibrillation   Last Assessment & Plan   05/22/2012 Office Visit Written 05/23/2012  8:48 AM by Kathlen Brunswick, MD     Patient has no symptoms attributable to atrial fibrillation. Heart rate is well-controlled. Effective prophylaxis of thromboembolism with warfarin.    Hypertension   Last Assessment & Plan   05/22/2012 Office Visit Edited 05/23/2012  8:49 AM by Kathlen Brunswick, MD     Blood pressure control is excellent, and current medications will be continued. Pedal edema will be addressed with salt restriction and leg elevation for now.    Chronic anticoagulation   Last Assessment & Plan   05/22/2012 Office Visit Written 05/23/2012  8:49 AM by Kathlen Brunswick, MD     Warfarin; monitored by Dr. Leanord Hawking at [facility] as of 02/2012    Hyperlipidemia   Last Assessment & Plan   03/21/2012 Office Visit Written 03/21/2012  8:06  PM by Kathlen Brunswick, MD     No lipid profile  available.  In the absence of known vascular disease, pharmacologic therapy may not be necessary.  To simplify assessment of her current symptoms, medications or not currently absolutely necessary, including simvastatin, will be discontinued.     Acute pericardial effusion   Last Assessment & Plan   05/22/2012 Office Visit Written 05/23/2012  8:47 AM by Kathlen Brunswick, MD     Pericardial effusion resolved as of early 04/2012.    Hypercalcemia   Last Assessment & Plan   04/02/2012 Office Visit Written 04/03/2012  8:50 PM by Kathlen Brunswick, MD     Most recent calcium in 03/2012 normal at 9.8 with PTH at the upper limit of normal.        Imaging: No results found.

## 2012-08-17 DIAGNOSIS — M1990 Unspecified osteoarthritis, unspecified site: Secondary | ICD-10-CM | POA: Insufficient documentation

## 2012-08-17 NOTE — Assessment & Plan Note (Addendum)
Blood pressure has only rarely been minimally elevated on multiple determinations over the past 6 months. Current therapy will be continued.  Nutritional status now appears adequate. Daily Ensure will be discontinued

## 2012-08-17 NOTE — Assessment & Plan Note (Signed)
Pericardial effusion has resolved. Etiology of pericarditis uncertain. No further evaluation or treatment warranted.

## 2012-08-17 NOTE — Assessment & Plan Note (Addendum)
Tolerating anticoagulation well without apparent complications.  We will continue to monitor CBCs and FOBT to exclude occult GI blood loss.

## 2012-08-17 NOTE — Assessment & Plan Note (Addendum)
No symptoms attributable to atrial arrhythmias. We will maintain our strategy of rate control and anticoagulation.

## 2012-08-17 NOTE — Assessment & Plan Note (Addendum)
At patient's request, voltaren gel ordered for treatment of DJD of the right knee.

## 2012-08-28 ENCOUNTER — Ambulatory Visit (INDEPENDENT_AMBULATORY_CARE_PROVIDER_SITE_OTHER): Payer: Medicare PPO | Admitting: *Deleted

## 2012-08-28 DIAGNOSIS — Z7901 Long term (current) use of anticoagulants: Secondary | ICD-10-CM

## 2012-08-31 DIAGNOSIS — Z7901 Long term (current) use of anticoagulants: Secondary | ICD-10-CM

## 2012-08-31 LAB — POC HEMOCCULT BLD/STL (HOME/3-CARD/SCREEN): Card #3 Fecal Occult Blood, POC: NEGATIVE

## 2012-09-04 ENCOUNTER — Encounter: Payer: Self-pay | Admitting: Cardiology

## 2012-09-04 ENCOUNTER — Encounter: Payer: Self-pay | Admitting: *Deleted

## 2012-12-14 ENCOUNTER — Other Ambulatory Visit: Payer: Self-pay | Admitting: *Deleted

## 2012-12-14 ENCOUNTER — Encounter: Payer: Self-pay | Admitting: *Deleted

## 2012-12-14 DIAGNOSIS — I10 Essential (primary) hypertension: Secondary | ICD-10-CM

## 2013-01-03 ENCOUNTER — Encounter: Payer: Self-pay | Admitting: Cardiovascular Disease

## 2013-02-07 ENCOUNTER — Encounter (HOSPITAL_COMMUNITY): Payer: Self-pay | Admitting: Emergency Medicine

## 2013-02-07 ENCOUNTER — Emergency Department (HOSPITAL_COMMUNITY)
Admission: EM | Admit: 2013-02-07 | Discharge: 2013-02-07 | Disposition: A | Discharge status: Skilled Nursing Facility | Payer: Medicare PPO | Attending: Emergency Medicine | Admitting: Emergency Medicine

## 2013-02-07 ENCOUNTER — Emergency Department (HOSPITAL_COMMUNITY): Payer: Medicare PPO

## 2013-02-07 DIAGNOSIS — M810 Age-related osteoporosis without current pathological fracture: Secondary | ICD-10-CM | POA: Insufficient documentation

## 2013-02-07 DIAGNOSIS — R5381 Other malaise: Secondary | ICD-10-CM | POA: Insufficient documentation

## 2013-02-07 DIAGNOSIS — Z8701 Personal history of pneumonia (recurrent): Secondary | ICD-10-CM | POA: Insufficient documentation

## 2013-02-07 DIAGNOSIS — R82998 Other abnormal findings in urine: Secondary | ICD-10-CM | POA: Insufficient documentation

## 2013-02-07 DIAGNOSIS — Z862 Personal history of diseases of the blood and blood-forming organs and certain disorders involving the immune mechanism: Secondary | ICD-10-CM | POA: Insufficient documentation

## 2013-02-07 DIAGNOSIS — Z8719 Personal history of other diseases of the digestive system: Secondary | ICD-10-CM | POA: Insufficient documentation

## 2013-02-07 DIAGNOSIS — Z79899 Other long term (current) drug therapy: Secondary | ICD-10-CM | POA: Insufficient documentation

## 2013-02-07 DIAGNOSIS — E860 Dehydration: Secondary | ICD-10-CM

## 2013-02-07 DIAGNOSIS — Z7983 Long term (current) use of bisphosphonates: Secondary | ICD-10-CM | POA: Insufficient documentation

## 2013-02-07 DIAGNOSIS — I10 Essential (primary) hypertension: Secondary | ICD-10-CM | POA: Insufficient documentation

## 2013-02-07 DIAGNOSIS — Z8619 Personal history of other infectious and parasitic diseases: Secondary | ICD-10-CM | POA: Insufficient documentation

## 2013-02-07 DIAGNOSIS — I4891 Unspecified atrial fibrillation: Secondary | ICD-10-CM | POA: Insufficient documentation

## 2013-02-07 DIAGNOSIS — Z7901 Long term (current) use of anticoagulants: Secondary | ICD-10-CM | POA: Insufficient documentation

## 2013-02-07 DIAGNOSIS — E785 Hyperlipidemia, unspecified: Secondary | ICD-10-CM | POA: Insufficient documentation

## 2013-02-07 DIAGNOSIS — Z8639 Personal history of other endocrine, nutritional and metabolic disease: Secondary | ICD-10-CM | POA: Insufficient documentation

## 2013-02-07 DIAGNOSIS — R5383 Other fatigue: Secondary | ICD-10-CM

## 2013-02-07 DIAGNOSIS — R8271 Bacteriuria: Secondary | ICD-10-CM

## 2013-02-07 LAB — CBC WITH DIFFERENTIAL/PLATELET
Eosinophils Absolute: 0.1 10*3/uL (ref 0.0–0.7)
Hemoglobin: 15.8 g/dL — ABNORMAL HIGH (ref 12.0–15.0)
Lymphocytes Relative: 20 % (ref 12–46)
Lymphs Abs: 1.2 10*3/uL (ref 0.7–4.0)
MCH: 30.3 pg (ref 26.0–34.0)
Monocytes Relative: 9 % (ref 3–12)
Neutro Abs: 4.3 10*3/uL (ref 1.7–7.7)
Neutrophils Relative %: 71 % (ref 43–77)
Platelets: 232 10*3/uL (ref 150–400)
RBC: 5.21 MIL/uL — ABNORMAL HIGH (ref 3.87–5.11)
RDW: 14.9 % (ref 11.5–15.5)
WBC: 6.2 10*3/uL (ref 4.0–10.5)

## 2013-02-07 LAB — URINE MICROSCOPIC-ADD ON

## 2013-02-07 LAB — COMPREHENSIVE METABOLIC PANEL
Alkaline Phosphatase: 98 U/L (ref 39–117)
BUN: 12 mg/dL (ref 6–23)
CO2: 26 mEq/L (ref 19–32)
Calcium: 10.8 mg/dL — ABNORMAL HIGH (ref 8.4–10.5)
Chloride: 99 mEq/L (ref 96–112)
Creatinine, Ser: 0.86 mg/dL (ref 0.50–1.10)
GFR calc non Af Amer: 59 mL/min — ABNORMAL LOW (ref >90)
Sodium: 138 mEq/L (ref 135–145)
Total Bilirubin: 0.4 mg/dL (ref 0.3–1.2)

## 2013-02-07 LAB — URINALYSIS, ROUTINE W REFLEX MICROSCOPIC
Glucose, UA: NEGATIVE mg/dL
Ketones, ur: NEGATIVE mg/dL
Leukocytes, UA: NEGATIVE
Protein, ur: NEGATIVE mg/dL
Specific Gravity, Urine: >1.03 — ABNORMAL HIGH (ref 1.005–1.030)
pH: 6 (ref 5.0–8.0)

## 2013-02-07 LAB — PRO B NATRIURETIC PEPTIDE: Pro B Natriuretic peptide (BNP): 908.8 pg/mL — ABNORMAL HIGH (ref 0–450)

## 2013-02-07 MED ORDER — DEXTROSE 5 % IV SOLN
1.0000 g | Freq: Once | INTRAVENOUS | Status: AC
  Administered 2013-02-07: 1 g via INTRAVENOUS
  Filled 2013-02-07: qty 10

## 2013-02-07 MED ORDER — SODIUM CHLORIDE 0.9 % IV BOLUS (SEPSIS)
500.0000 mL | Freq: Once | INTRAVENOUS | Status: AC
  Administered 2013-02-07: 500 mL via INTRAVENOUS

## 2013-02-07 NOTE — ED Notes (Signed)
Pt comes from [facility] via EMS after staff reports pt "has not been herself" for the past few days. Pt states "I've been more tired recently". Pt A&O x4 and able to answer all questions during assessment. Pt denies any pain. Pt uses cane and states she able been able to ambulate with cane "like usual". Pt denies unusual urinary symptoms.

## 2013-02-07 NOTE — ED Notes (Addendum)
Patient with no complaints at this time. Respirations even and unlabored. Skin warm/dry. Discharge instructions reviewed with patient at this time. Patient given opportunity to voice concerns/ask questions. IV removed per policy and band-aid applied to site. Patient discharged at this time and left Emergency Department via wheelchair.  

## 2013-02-07 NOTE — ED Provider Notes (Signed)
CSN: [id number]     Arrival date & time 02/07/13  1507 History   First MD Initiated Contact with Patient 02/07/13 1518   This chart was scribed for Nelia Shi, MD by Valera Castle, ED Scribe. This patient was seen in room APA05/APA05 and the patient's care was started at 3:21 PM.    Chief Complaint  Patient presents with  . Fatigue    The history is provided by the patient and the EMS personnel. No language interpreter was used.   HPI Comments: Tamara [name] is a 79 y.o. female brought in by EMS from [facility] who presents to the Emergency Department complaining of sudden, severe fatigue, weakness, and drowsiness, onset over 1 week ago. She reports associated urinary retention. She denies h/o similar symptoms. She denies being on any new medication. She states she usually is not this tired and this weak. Per EMS pt reports being able to ambulate with a cane "like usual". She denies h/o Thyroid problems. She denies back pain, any other pain, and any other associated symptoms.   PCP - Catalina Pizza, MD   Past Medical History  Diagnosis Date  . Hypertension   . Atrial fibrillation     Onset well before 2004; Negative stress nuclear study in 01/2003  . Osteoporosis   . Acute colitis 09/2011    C. difficile negative  . Chronic anticoagulation   . Hyperlipidemia   . Hiatal hernia     by CT in 2013; also noted was an adrenal adenoma, duodenal lipoma right ovarian cyst, post hysterectomy  . Pneumonia 09/2011    09/2011   Past Surgical History  Procedure Laterality Date  . Abdominal hysterectomy      No oophorectomy  . Appendectomy    . Colonoscopy  2005    Reportedly normal screening study   Family History  Problem Relation Age of Onset  . Heart attack Father   . Cancer Mother   . Hypertension Father    History  Substance Use Topics  . Smoking status: Never Smoker   . Smokeless tobacco: Never Used  . Alcohol Use: No   OB History   Grav Para Term Preterm Abortions  TAB SAB Ect Mult Living                 Review of Systems A complete 10 system review of systems was obtained and all systems are negative except as noted in the HPI and PMH.   Allergies  Review of patient's allergies indicates no known allergies.  Home Medications   Current Outpatient Rx  Name  Route  Sig  Dispense  Refill  . alendronate (FOSAMAX) 70 MG tablet   Oral   Take 1 tablet (70 mg total) by mouth every 7 (seven) days. Take with a full glass of water on an empty stomach.   4 tablet   5   . diclofenac sodium (VOLTAREN) 1 % GEL   Topical   Apply 4 g topically 3 (three) times daily as needed.   100 g   1   . diltiazem (TIAZAC) 120 MG 24 hr capsule   Oral   Take 120 mg by mouth daily.         Marland Kitchen escitalopram (LEXAPRO) 10 MG tablet   Oral   Take 1 tablet (10 mg total) by mouth daily.   30 tablet   3   . levothyroxine (SYNTHROID, LEVOTHROID) 100 MCG tablet   Oral   Take 100 mcg by  mouth daily before breakfast.         . memantine (NAMENDA) 10 MG tablet   Oral   Take 10 mg by mouth 2 (two) times daily. Administered at 8am and 5pm         . omeprazole (PRILOSEC) 40 MG capsule   Oral   Take 1 capsule (40 mg total) by mouth daily. For heartburn   30 capsule   3   . Polyethyl Glycol-Propyl Glycol (SYSTANE OP)   Ophthalmic   Apply to eye 2 (two) times daily. 1 drop into both eyes         . potassium chloride (K-DUR) 10 MEQ tablet   Oral   Take 10 mEq by mouth daily.          Marland Kitchen warfarin (COUMADIN) 3 MG tablet   Oral   Take 4.5 mg by mouth See admin instructions. Take one and one-half tablet (4.5mg  total) on Tuesdays, Thursdays, Saturdays, and Sundays ONLY         . warfarin (COUMADIN) 6 MG tablet   Oral   Take 6 mg by mouth See admin instructions. Take one tablet (6mg  total) on Mondays Wednesdays and Fridays ONLY          BP 135/59  Pulse 77  Resp 20  SpO2 97%  Physical Exam  Nursing note and vitals reviewed. Constitutional: She is  oriented to person, place, and time. She appears well-developed and well-nourished. No distress.  HENT:  Head: Normocephalic and atraumatic.  Eyes: Pupils are equal, round, and reactive to light.  Neck: Normal range of motion. No JVD present.  Cardiovascular: Normal rate and intact distal pulses.   Bedside ultrasound showed no evidence of pericardial effusion.  Pulmonary/Chest: No respiratory distress.  Abdominal: Normal appearance. She exhibits no distension. There is no tenderness. There is no rebound.  Musculoskeletal: Normal range of motion.  Neurological: She is alert and oriented to person, place, and time. No cranial nerve deficit.  Skin: Skin is warm and dry. No rash noted.  Psychiatric: She has a normal mood and affect. Her behavior is normal.    ED Course  Procedures (including critical care time)  DIAGNOSTIC STUDIES: Oxygen Saturation is 97% on room air, normal by my interpretation.    COORDINATION OF CARE: 3:21 PM-Discussed treatment plan which includes EKG and rectal temperature with pt at bedside and pt agreed to plan.   Labs Reviewed  COMPREHENSIVE METABOLIC PANEL - Abnormal; Notable for the following:    Glucose, Bld 106 (*)    Calcium 10.8 (*)    GFR calc non Af Amer 59 (*)    GFR calc Af Amer 69 (*)    All other components within normal limits  CBC WITH DIFFERENTIAL - Abnormal; Notable for the following:    RBC 5.21 (*)    Hemoglobin 15.8 (*)    HCT 46.7 (*)    All other components within normal limits  URINALYSIS, ROUTINE W REFLEX MICROSCOPIC - Abnormal; Notable for the following:    Specific Gravity, Urine >1.030 (*)    Hgb urine dipstick TRACE (*)    All other components within normal limits  PRO B NATRIURETIC PEPTIDE - Abnormal; Notable for the following:    Pro B Natriuretic peptide (BNP) 908.8 (*)    All other components within normal limits  URINE MICROSCOPIC-ADD ON - Abnormal; Notable for the following:    Bacteria, UA MANY (*)    All other  components within normal limits  URINE CULTURE  TSH  PTH, INTACT AND CALCIUM  POCT I-STAT TROPONIN I   Imaging Review Dg Chest 2 View  02/07/2013   CLINICAL DATA:  Fatigue  EXAM: CHEST  2 VIEW  COMPARISON:  04/02/2012  FINDINGS: The lungs are hyperinflated likely secondary to COPD. There is no focal parenchymal opacity, pleural effusion, or pneumothorax. Stable cardiomegaly.  Mild degenerative disc disease throughout the thoracic spine.  IMPRESSION: No active cardiopulmonary disease.   Electronically Signed   By: Elige Ko   On: 02/07/2013 16:15    EKG Interpretation    Date/Time:  Thursday February 07 2013 15:13:54 EST Ventricular Rate:  70 PR Interval:    QRS Duration: 66 QT Interval:  440 QTC Calculation: 475 R Axis:   -27 Text Interpretation:  Atrial fibrillation Abnormal ECG When compared with ECG of 25-Feb-2012 15:18, Minimal criteria for Anterior infarct are no longer Present T wave inversion no longer evident in Inferior leads T wave inversion no longer evident in Anterolateral leads Confirmed by Lexys Milliner  MD, Pearlean Sabina ([id number]) on 02/07/2013 3:37:32 PM            MDM   1. Fatigue   2. Hypercalcemia   3. Dehydration   4. Bacteria in urine     Will plan on checking a PTH level and urine culture.  Have instructed patient to followup with her primary care physician for interpretation and further workup.   I personally performed the services described in this documentation, which was scribed in my presence. The recorded information has been reviewed and considered.    Nelia Shi, MD 02/07/13 [phone]

## 2013-02-07 NOTE — ED Notes (Signed)
Report given to [place], LPN at [facility].

## 2013-02-08 LAB — PTH, INTACT AND CALCIUM
Calcium, Total (PTH): 9.1 mg/dL (ref 8.4–10.5)
PTH: 76.5 pg/mL — ABNORMAL HIGH (ref 14.0–72.0)

## 2013-02-09 LAB — URINE CULTURE
Colony Count: NO GROWTH
Culture: NO GROWTH

## 2013-02-23 ENCOUNTER — Emergency Department (HOSPITAL_COMMUNITY): Payer: Medicare PPO

## 2013-02-23 ENCOUNTER — Emergency Department (HOSPITAL_COMMUNITY)
Admission: EM | Admit: 2013-02-23 | Discharge: 2013-02-24 | Disposition: A | Discharge status: Home / Self Care | Payer: Medicare PPO | Source: Home / Self Care | Attending: Emergency Medicine | Admitting: Emergency Medicine

## 2013-02-23 ENCOUNTER — Encounter (HOSPITAL_COMMUNITY): Payer: Self-pay | Admitting: Emergency Medicine

## 2013-02-23 DIAGNOSIS — Z8701 Personal history of pneumonia (recurrent): Secondary | ICD-10-CM

## 2013-02-23 DIAGNOSIS — M810 Age-related osteoporosis without current pathological fracture: Secondary | ICD-10-CM

## 2013-02-23 DIAGNOSIS — M6281 Muscle weakness (generalized): Secondary | ICD-10-CM

## 2013-02-23 DIAGNOSIS — I4891 Unspecified atrial fibrillation: Secondary | ICD-10-CM | POA: Insufficient documentation

## 2013-02-23 DIAGNOSIS — Z79899 Other long term (current) drug therapy: Secondary | ICD-10-CM

## 2013-02-23 DIAGNOSIS — Z8719 Personal history of other diseases of the digestive system: Secondary | ICD-10-CM | POA: Insufficient documentation

## 2013-02-23 DIAGNOSIS — Z90710 Acquired absence of both cervix and uterus: Secondary | ICD-10-CM

## 2013-02-23 DIAGNOSIS — Y9389 Activity, other specified: Secondary | ICD-10-CM

## 2013-02-23 DIAGNOSIS — Z7901 Long term (current) use of anticoagulants: Secondary | ICD-10-CM | POA: Insufficient documentation

## 2013-02-23 DIAGNOSIS — E785 Hyperlipidemia, unspecified: Secondary | ICD-10-CM

## 2013-02-23 DIAGNOSIS — I10 Essential (primary) hypertension: Secondary | ICD-10-CM

## 2013-02-23 DIAGNOSIS — S52599A Other fractures of lower end of unspecified radius, initial encounter for closed fracture: Secondary | ICD-10-CM

## 2013-02-23 DIAGNOSIS — W06XXXA Fall from bed, initial encounter: Secondary | ICD-10-CM | POA: Insufficient documentation

## 2013-02-23 DIAGNOSIS — Z8742 Personal history of other diseases of the female genital tract: Secondary | ICD-10-CM | POA: Insufficient documentation

## 2013-02-23 DIAGNOSIS — Y9210 Unspecified residential institution as the place of occurrence of the external cause: Secondary | ICD-10-CM

## 2013-02-23 MED ORDER — HYDROCODONE-ACETAMINOPHEN 5-325 MG PO TABS: 1.0000 | ORAL_TABLET | Freq: Four times a day (QID) | ORAL | Status: DC | PRN

## 2013-02-23 MED ORDER — HYDROCODONE-ACETAMINOPHEN 5-325 MG PO TABS
1.0000 | ORAL_TABLET | Freq: Once | ORAL | Status: AC
  Administered 2013-02-23: 1 via ORAL
  Filled 2013-02-23: qty 1

## 2013-02-23 NOTE — ED Notes (Signed)
Here by ems, on LSB with neck collar s/p fall. Slipped getting out of bed, slid onto buttocks and left wrist. ems reports angulated fx of left wrist. No loc,

## 2013-02-23 NOTE — ED Notes (Signed)
Attempted to have pt sit upright, unable to do so secondary to increased pain low back with any movement. ERMD aware

## 2013-02-23 NOTE — ED Provider Notes (Addendum)
CSN: [id number]     Arrival date & time 02/23/13  1859 History   First MD Initiated Contact with Patient 02/23/13 1915 This chart was scribed for Ward Givens, MD by Valera Castle, ED Scribe. This patient was seen in room APA05/APA05 and the patient's care was started at 8:03 PM.      Chief Complaint  Patient presents with  . Fall    The history is provided by the patient and a relative. No language interpreter was used.   HPI Comments: Tamara [name] is a 80 y.o. female who presents to the Emergency Department complaining of bilateral knee weakness, when her knees gave out on her earlier today when she tried to get up to go to the bathroom. Pt was trying to get out of her bed and slid down to the floor with her left hand outstretched to break her fall. Relative reports pt has had bilateral knee weakness over the last couple of weeks. Relative reports she was at [facility], assisted living, when she fell. She reports mild, constant, left wrist pain from the fall. Relative reports pt has walker she uses to ambulate. Pt denies head trauma, LOC, neck pain, chest pain, and any other associated symptoms.  PCP - Catalina Pizza, MD  Past Medical History  Diagnosis Date  . Hypertension   . Atrial fibrillation     Onset well before 2004; Negative stress nuclear study in 01/2003  . Osteoporosis   . Acute colitis 09/2011    C. difficile negative  . Chronic anticoagulation   . Hyperlipidemia   . Hiatal hernia     by CT in 2013; also noted was an adrenal adenoma, duodenal lipoma right ovarian cyst, post hysterectomy  . Pneumonia 09/2011    09/2011   Past Surgical History  Procedure Laterality Date  . Abdominal hysterectomy      No oophorectomy  . Appendectomy    . Colonoscopy  2005    Reportedly normal screening study   Family History  Problem Relation Age of Onset  . Heart attack Father   . Cancer Mother   . Hypertension Father    History  Substance Use Topics  . Smoking status:  Never Smoker   . Smokeless tobacco: Never Used  . Alcohol Use: No   Lives in ALF Uses a walker and cane   OB History   Grav Para Term Preterm Abortions TAB SAB Ect Mult Living                 Review of Systems  Cardiovascular: Negative for chest pain.  Musculoskeletal: Negative for neck pain.  Neurological: Positive for weakness (bilateral knees). Negative for syncope and headaches.  All other systems reviewed and are negative.    Allergies  Review of patient's allergies indicates no known allergies.  Home Medications   Current Outpatient Rx  Name  Route  Sig  Dispense  Refill  . alendronate (FOSAMAX) 70 MG tablet   Oral   Take 70 mg by mouth once a week. Take with a full glass of water on an empty stomach.(on Sunday)         . diclofenac sodium (VOLTAREN) 1 % GEL   Topical   Apply 4 g topically 3 (three) times daily as needed. pain         . diltiazem (CARDIZEM) 120 MG tablet   Oral   Take 120 mg by mouth daily.         Marland Kitchen escitalopram (LEXAPRO)  10 MG tablet   Oral   Take 1 tablet (10 mg total) by mouth daily.   30 tablet   3   . levothyroxine (SYNTHROID, LEVOTHROID) 100 MCG tablet   Oral   Take 100 mcg by mouth daily before breakfast.         . memantine (NAMENDA) 10 MG tablet   Oral   Take 10 mg by mouth 2 (two) times daily. Administered at 8am and 5pm         . omeprazole (PRILOSEC) 40 MG capsule   Oral   Take 1 capsule (40 mg total) by mouth daily. For heartburn   30 capsule   3   . Polyethyl Glycol-Propyl Glycol (SYSTANE OP)   Ophthalmic   Apply to eye 2 (two) times daily. 1 drop into both eyes         . potassium chloride (K-DUR) 10 MEQ tablet   Oral   Take 10 mEq by mouth daily.          Marland Kitchen. warfarin (COUMADIN) 6 MG tablet   Oral   Take 6 mg by mouth See admin instructions. Take one tablet (6mg  total) on Mondays Wednesdays and Fridays ONLY         . warfarin (COUMADIN) 3 MG tablet   Oral   Take 4.5 mg by mouth See admin  instructions. Take one and one-half tablet (4.5mg  total) on Tuesdays, Thursdays, Saturdays, and Sundays ONLY          BP 176/82  Pulse 100  Temp(Src) 98.9 F (37.2 C) (Oral)  Resp 24  Ht 5\' 10"  (1.778 m)  Wt 150 lb (68.04 kg)  BMI 21.52 kg/m2  SpO2 94%  Vital signs normal    Physical Exam  Nursing note and vitals reviewed. Constitutional: She is oriented to person, place, and time. She appears well-developed and well-nourished.  Non-toxic appearance. She does not appear ill. No distress.  HENT:  Head: Normocephalic and atraumatic.  Right Ear: External ear normal.  Left Ear: External ear normal.  Nose: Nose normal. No mucosal edema or rhinorrhea.  Mouth/Throat: Oropharynx is clear and moist and mucous membranes are normal. No dental abscesses or uvula swelling.  nontender head  Eyes: Conjunctivae and EOM are normal. Pupils are equal, round, and reactive to light.  Neck: Normal range of motion and full passive range of motion without pain. Neck supple.   No pain to palpation of c-spine. No pain with ROM so collar was left off  Cardiovascular: Normal rate, regular rhythm and normal heart sounds.  Exam reveals no gallop and no friction rub.   No murmur heard. Pulmonary/Chest: Effort normal and breath sounds normal. No respiratory distress. She has no wheezes. She has no rhonchi. She has no rales. She exhibits no tenderness and no crepitus.  Abdominal: Soft. Normal appearance and bowel sounds are normal. She exhibits no distension. There is no tenderness. There is no rebound and no guarding.  Musculoskeletal: Normal range of motion. She exhibits no edema and no tenderness.  Moves all extremities well. Good ROM of fingers of the left hand, sensation intact. Has diffuse swelling and bruising of her left wrist without obvious deformity. Good flexion extension of hips and knees without pain.  Neurological: She is alert and oriented to person, place, and time. She has normal strength. No  cranial nerve deficit.  Skin: Skin is warm, dry and intact. No rash noted. No erythema. No pallor.  Psychiatric: She has a normal mood and affect. Her  speech is normal and behavior is normal. Her mood appears not anxious.    ED Course  Procedures (including critical care time)  DIAGNOSTIC STUDIES: Oxygen Saturation is 94% on room air, adequate by my interpretation.    COORDINATION OF CARE: 8:07 PM-Discussed treatment plan which includes DG left wrist with pt at bedside and pt agreed to plan.   8:37 PM - Discussed imaging resulting in left wrist fx. Advised pt and relative that pt will probably need surgery to fix fx. Pt reports being right hand dominant. Will consult Dr. Romeo Apple to see if specialist is needed. Pt denies needing pain medication. Will order splint for pt.   20:45 Dr Romeo Apple, states he can see in the office.   PT placed in a sugar tong splint by nursing staff.   Review of prior visits shows patient was seen on December 18 and had lab work and a urinalysis done at that time.  Nurse reports patient complained of back pain when they got her up to be discharged. LS-spine x-rays were done. There was questionable new endplate fracture. CT of the lumbar spine was done. There are no acute fractures. Patient was discharged.  Labs Review Labs Reviewed - No data to display Imaging Review Dg Wrist Complete Left  02/23/2013   CLINICAL DATA:  Fall.  Wrist injury and pain.  EXAM: LEFT WRIST - COMPLETE 3+ VIEW  COMPARISON:  None.  FINDINGS: Comminuted fracture of the distal radius is seen with intra-articular extension into the radiocarpal joint. There is mild impaction and dorsal angulation of the distal articular surface of the radius.  Nondisplaced ulnar styloid fracture also noted. No carpal bone fracture or dislocation identified. Osteoarthritis of the radiocarpal, STT joint complex, and base of thumb noted. Peripheral vascular calcification also demonstrated.  IMPRESSION: Comminuted  fracture of the distal radius, with mild impaction and dorsal angulation.  Nondisplaced ulnar styloid fracture.  Wrist osteoarthritis.   Electronically Signed   By: Myles Rosenthal M.D.   On: 02/23/2013 20:07   Dg Lumbar Spine Complete  02/23/2013   CLINICAL DATA:  Fall with back pain.  EXAM: LUMBAR SPINE - COMPLETE 4+ VIEW  COMPARISON:  KUB 09/27/2011 and CT abdomen/ pelvis 09/28/2011.  FINDINGS: Exam demonstrates severe spondylosis of the lumbar spine. There is mild decreased bone density. There is mild compression deformity of L1 unchanged. There is disc space narrowing at multiple levels unchanged. There subtle anterior wedging of the T12 not well seen on the prior exams. There is possible mild loss of posterior vertebral body height of L5 not well seen on the prior exams. Facet arthropathy is present. There is calcified plaque over the abdominal aorta.  IMPRESSION: Severe spondylosis of the lumbar spinal multilevel disc disease. Stable L1 compression fracture. Mild anterior wedging of T12 a mild posterior wedging of L5 not well seen on the prior exams as this is likely chronic although cannot exclude acute injury.   Electronically Signed   By: Elberta Fortis M.D.   On: 02/23/2013 21:44   Ct Lumbar Spine Wo Contrast  02/24/2013   CLINICAL DATA:  Fall with mid back pain. Abnormal lumbar spine radiography  EXAM: CT LUMBAR SPINE WITHOUT CONTRAST  TECHNIQUE: Multidetector CT imaging of the lumbar spine was performed without intravenous contrast administration. Multiplanar CT image reconstructions were also generated.  COMPARISON:  Abdominal CT 09/28/2011  FINDINGS: No evidence of acute fracture. There is stable mild compression deformity of the L1 vertebral body, with upper body sclerosis. Remote left L1 and  L2 transverse process fractures, with callus. No perivertebral edema. There is diffuse spondylosis and degenerative facet change, without evidence of significant osseous canal or foraminal stenosis.  Incidental  2.5 cm adenoma in the right adrenal gland. There is a nonobstructive lipoma within the mid duodenum. A right adnexal/ovarian cyst is larger than before, now 5 x 4 cm as compared to 4 x 3 cm.  IMPRESSION: 1. No evidence of acute lumbar fracture. 2. Remote L1 superior endplate fracture. Remote L1 and L2 left transverse process fractures. 3. Right adnexal/ovarian cyst has enlarged 1 cm since 09/28/2011, now 5 cm.   Electronically Signed   By: Tiburcio Pea M.D.   On: 02/24/2013 00:01      EKG Interpretation   None       MDM   1. Fracture of wrist, closed, left, closed, initial encounter    New Prescriptions   HYDROCODONE-ACETAMINOPHEN (NORCO/VICODIN) 5-325 MG PER TABLET    Take 1 tablet by mouth every 6 (six) hours as needed for moderate pain.    Plan discharge   Devoria Albe, MD, FACEP   I personally performed the services described in this documentation, which was scribed in my presence. The recorded information has been reviewed and considered.  Devoria Albe, MD, FACEP    Ward Givens, MD 02/23/13 1610  Ward Givens, MD 02/24/13 [phone]

## 2013-02-23 NOTE — Discharge Instructions (Signed)
Elevate the arm, use ice packs for pain and swelling. Take the norco for pain if needed. Leave the splint on until you can be seen by Dr Romeo AppleHarrison, the orthopedist on call. Call his office on Monday, Jan 5th to get an appointment. Return to the ED for any problems listed on the head injury sheet.

## 2013-02-24 ENCOUNTER — Inpatient Hospital Stay (HOSPITAL_COMMUNITY)
Admission: EM | Admit: 2013-02-24 | Discharge: 2013-02-26 | DRG: 194 | Disposition: A | Discharge status: Skilled Nursing Facility | Payer: Medicare PPO | Attending: Internal Medicine | Admitting: Internal Medicine

## 2013-02-24 ENCOUNTER — Emergency Department (HOSPITAL_COMMUNITY): Payer: Medicare PPO

## 2013-02-24 ENCOUNTER — Encounter (HOSPITAL_COMMUNITY): Payer: Self-pay | Admitting: Emergency Medicine

## 2013-02-24 DIAGNOSIS — Y9210 Unspecified residential institution as the place of occurrence of the external cause: Secondary | ICD-10-CM | POA: Diagnosis present

## 2013-02-24 DIAGNOSIS — J09X2 Influenza due to identified novel influenza A virus with other respiratory manifestations: Principal | ICD-10-CM | POA: Diagnosis present

## 2013-02-24 DIAGNOSIS — I4891 Unspecified atrial fibrillation: Secondary | ICD-10-CM | POA: Diagnosis present

## 2013-02-24 DIAGNOSIS — I309 Acute pericarditis, unspecified: Secondary | ICD-10-CM

## 2013-02-24 DIAGNOSIS — E785 Hyperlipidemia, unspecified: Secondary | ICD-10-CM | POA: Diagnosis present

## 2013-02-24 DIAGNOSIS — S62109A Fracture of unspecified carpal bone, unspecified wrist, initial encounter for closed fracture: Secondary | ICD-10-CM

## 2013-02-24 DIAGNOSIS — S62102A Fracture of unspecified carpal bone, left wrist, initial encounter for closed fracture: Secondary | ICD-10-CM

## 2013-02-24 DIAGNOSIS — Z8249 Family history of ischemic heart disease and other diseases of the circulatory system: Secondary | ICD-10-CM

## 2013-02-24 DIAGNOSIS — Z66 Do not resuscitate: Secondary | ICD-10-CM | POA: Diagnosis present

## 2013-02-24 DIAGNOSIS — I10 Essential (primary) hypertension: Secondary | ICD-10-CM | POA: Diagnosis present

## 2013-02-24 DIAGNOSIS — J209 Acute bronchitis, unspecified: Secondary | ICD-10-CM | POA: Diagnosis present

## 2013-02-24 DIAGNOSIS — Z7901 Long term (current) use of anticoagulants: Secondary | ICD-10-CM

## 2013-02-24 DIAGNOSIS — M810 Age-related osteoporosis without current pathological fracture: Secondary | ICD-10-CM | POA: Diagnosis present

## 2013-02-24 DIAGNOSIS — E876 Hypokalemia: Secondary | ICD-10-CM

## 2013-02-24 DIAGNOSIS — R509 Fever, unspecified: Secondary | ICD-10-CM | POA: Diagnosis present

## 2013-02-24 DIAGNOSIS — M1990 Unspecified osteoarthritis, unspecified site: Secondary | ICD-10-CM

## 2013-02-24 DIAGNOSIS — E039 Hypothyroidism, unspecified: Secondary | ICD-10-CM | POA: Diagnosis present

## 2013-02-24 DIAGNOSIS — M549 Dorsalgia, unspecified: Secondary | ICD-10-CM | POA: Diagnosis present

## 2013-02-24 DIAGNOSIS — R3129 Other microscopic hematuria: Secondary | ICD-10-CM | POA: Diagnosis present

## 2013-02-24 DIAGNOSIS — E860 Dehydration: Secondary | ICD-10-CM | POA: Diagnosis present

## 2013-02-24 DIAGNOSIS — J40 Bronchitis, not specified as acute or chronic: Secondary | ICD-10-CM

## 2013-02-24 DIAGNOSIS — Z9181 History of falling: Secondary | ICD-10-CM

## 2013-02-24 DIAGNOSIS — R319 Hematuria, unspecified: Secondary | ICD-10-CM

## 2013-02-24 DIAGNOSIS — W19XXXA Unspecified fall, initial encounter: Secondary | ICD-10-CM | POA: Diagnosis present

## 2013-02-24 DIAGNOSIS — R4182 Altered mental status, unspecified: Secondary | ICD-10-CM

## 2013-02-24 DIAGNOSIS — F0390 Unspecified dementia without behavioral disturbance: Secondary | ICD-10-CM | POA: Diagnosis present

## 2013-02-24 DIAGNOSIS — S52599A Other fractures of lower end of unspecified radius, initial encounter for closed fracture: Secondary | ICD-10-CM | POA: Diagnosis present

## 2013-02-24 LAB — URINALYSIS, ROUTINE W REFLEX MICROSCOPIC
Bilirubin Urine: NEGATIVE
GLUCOSE, UA: NEGATIVE mg/dL
KETONES UR: NEGATIVE mg/dL
LEUKOCYTES UA: NEGATIVE
Nitrite: NEGATIVE
PROTEIN: NEGATIVE mg/dL
Specific Gravity, Urine: 1.02 (ref 1.005–1.030)
Urobilinogen, UA: 0.2 mg/dL (ref 0.0–1.0)
pH: 6.5 (ref 5.0–8.0)

## 2013-02-24 LAB — CBC WITH DIFFERENTIAL/PLATELET
Basophils Absolute: 0 10*3/uL (ref 0.0–0.1)
Basophils Relative: 0 % (ref 0–1)
EOS PCT: 0 % (ref 0–5)
Eosinophils Absolute: 0 10*3/uL (ref 0.0–0.7)
HCT: 41.7 % (ref 36.0–46.0)
Hemoglobin: 14.1 g/dL (ref 12.0–15.0)
LYMPHS ABS: 0.4 10*3/uL — AB (ref 0.7–4.0)
Lymphocytes Relative: 6 % — ABNORMAL LOW (ref 12–46)
MCH: 30.2 pg (ref 26.0–34.0)
MCHC: 33.8 g/dL (ref 30.0–36.0)
MCV: 89.3 fL (ref 78.0–100.0)
MONO ABS: 1 10*3/uL (ref 0.1–1.0)
Monocytes Relative: 15 % — ABNORMAL HIGH (ref 3–12)
Neutro Abs: 5.2 10*3/uL (ref 1.7–7.7)
Neutrophils Relative %: 79 % — ABNORMAL HIGH (ref 43–77)
PLATELETS: 194 10*3/uL (ref 150–400)
RBC: 4.67 MIL/uL (ref 3.87–5.11)
RDW: 14.7 % (ref 11.5–15.5)
WBC: 6.6 10*3/uL (ref 4.0–10.5)

## 2013-02-24 LAB — COMPREHENSIVE METABOLIC PANEL
ALT: 13 U/L (ref 0–35)
AST: 22 U/L (ref 0–37)
Albumin: 3.3 g/dL — ABNORMAL LOW (ref 3.5–5.2)
Alkaline Phosphatase: 93 U/L (ref 39–117)
BUN: 11 mg/dL (ref 6–23)
CALCIUM: 9.3 mg/dL (ref 8.4–10.5)
CO2: 25 mEq/L (ref 19–32)
Chloride: 100 mEq/L (ref 96–112)
Creatinine, Ser: 0.71 mg/dL (ref 0.50–1.10)
GFR, EST AFRICAN AMERICAN: 88 mL/min — AB (ref >90)
GFR, EST NON AFRICAN AMERICAN: 76 mL/min — AB (ref >90)
GLUCOSE: 117 mg/dL — AB (ref 70–99)
Potassium: 3.5 mEq/L — ABNORMAL LOW (ref 3.7–5.3)
SODIUM: 138 meq/L (ref 137–147)
Total Bilirubin: 0.5 mg/dL (ref 0.3–1.2)
Total Protein: 6.1 g/dL (ref 6.0–8.3)

## 2013-02-24 LAB — URINE MICROSCOPIC-ADD ON

## 2013-02-24 LAB — PROTIME-INR
INR: 1.69 — ABNORMAL HIGH (ref 0.00–1.49)
Prothrombin Time: 19.4 seconds — ABNORMAL HIGH (ref 11.6–15.2)

## 2013-02-24 LAB — INFLUENZA PANEL BY PCR (TYPE A & B)
H1N1FLUPCR: NOT DETECTED
INFLAPCR: POSITIVE — AB
Influenza B By PCR: NEGATIVE

## 2013-02-24 MED ORDER — DOCUSATE SODIUM 100 MG PO CAPS
100.0000 mg | ORAL_CAPSULE | Freq: Two times a day (BID) | ORAL | Status: DC
  Administered 2013-02-24 – 2013-02-26 (×4): 100 mg via ORAL
  Filled 2013-02-24 (×4): qty 1

## 2013-02-24 MED ORDER — HYDROCODONE-ACETAMINOPHEN 5-325 MG PO TABS
ORAL_TABLET | ORAL | Status: AC
  Filled 2013-02-24: qty 1

## 2013-02-24 MED ORDER — PIPERACILLIN-TAZOBACTAM 3.375 G IVPB 30 MIN
3.3750 g | Freq: Once | INTRAVENOUS | Status: AC
  Administered 2013-02-24: 3.375 g via INTRAVENOUS
  Filled 2013-02-24 (×2): qty 50

## 2013-02-24 MED ORDER — LEVALBUTEROL HCL 0.63 MG/3ML IN NEBU: 0.6300 mg | INHALATION_SOLUTION | RESPIRATORY_TRACT | Status: DC | PRN

## 2013-02-24 MED ORDER — LEVALBUTEROL HCL 0.63 MG/3ML IN NEBU
1.2500 mg | INHALATION_SOLUTION | RESPIRATORY_TRACT | Status: DC | PRN
  Administered 2013-02-24: 1.26 mg via RESPIRATORY_TRACT
  Filled 2013-02-24: qty 6

## 2013-02-24 MED ORDER — OSELTAMIVIR PHOSPHATE 75 MG PO CAPS
75.0000 mg | ORAL_CAPSULE | Freq: Two times a day (BID) | ORAL | Status: DC
  Administered 2013-02-24 – 2013-02-26 (×4): 75 mg via ORAL
  Filled 2013-02-24 (×4): qty 1

## 2013-02-24 MED ORDER — MEMANTINE HCL 10 MG PO TABS
10.0000 mg | ORAL_TABLET | Freq: Two times a day (BID) | ORAL | Status: DC
  Administered 2013-02-25 – 2013-02-26 (×3): 10 mg via ORAL
  Filled 2013-02-24 (×3): qty 1

## 2013-02-24 MED ORDER — POTASSIUM CHLORIDE 10 MEQ/100ML IV SOLN
10.0000 meq | INTRAVENOUS | Status: AC
  Administered 2013-02-24 – 2013-02-25 (×4): 10 meq via INTRAVENOUS
  Filled 2013-02-24 (×4): qty 100

## 2013-02-24 MED ORDER — VANCOMYCIN HCL IN DEXTROSE 1-5 GM/200ML-% IV SOLN
1000.0000 mg | Freq: Once | INTRAVENOUS | Status: AC
  Administered 2013-02-24: 1000 mg via INTRAVENOUS
  Filled 2013-02-24: qty 200

## 2013-02-24 MED ORDER — ONDANSETRON HCL 4 MG/2ML IJ SOLN: 4.0000 mg | Freq: Four times a day (QID) | INTRAMUSCULAR | Status: DC | PRN

## 2013-02-24 MED ORDER — LEVOFLOXACIN IN D5W 500 MG/100ML IV SOLN
500.0000 mg | INTRAVENOUS | Status: DC
Start: 2013-02-24 — End: 2013-02-24
  Filled 2013-02-24: qty 100

## 2013-02-24 MED ORDER — LEVALBUTEROL HCL 0.63 MG/3ML IN NEBU: 0.6300 mg | INHALATION_SOLUTION | Freq: Four times a day (QID) | RESPIRATORY_TRACT | Status: DC

## 2013-02-24 MED ORDER — ONDANSETRON HCL 4 MG PO TABS: 4.0000 mg | ORAL_TABLET | Freq: Four times a day (QID) | ORAL | Status: DC | PRN

## 2013-02-24 MED ORDER — PANTOPRAZOLE SODIUM 40 MG PO TBEC
40.0000 mg | DELAYED_RELEASE_TABLET | Freq: Every day | ORAL | Status: DC
  Administered 2013-02-25 – 2013-02-26 (×2): 40 mg via ORAL
  Filled 2013-02-24 (×2): qty 1

## 2013-02-24 MED ORDER — ESCITALOPRAM OXALATE 10 MG PO TABS
10.0000 mg | ORAL_TABLET | Freq: Every day | ORAL | Status: DC
Start: 2013-02-25 — End: 2013-02-26
  Administered 2013-02-25 – 2013-02-26 (×2): 10 mg via ORAL
  Filled 2013-02-24 (×2): qty 1

## 2013-02-24 MED ORDER — SODIUM CHLORIDE 0.9 % IV SOLN
INTRAVENOUS | Status: AC
  Administered 2013-02-25: 1 mL via INTRAVENOUS
  Administered 2013-02-25: 06:00:00 via INTRAVENOUS

## 2013-02-24 MED ORDER — LEVOFLOXACIN IN D5W 500 MG/100ML IV SOLN
500.0000 mg | INTRAVENOUS | Status: DC
  Administered 2013-02-25: 500 mg via INTRAVENOUS
  Filled 2013-02-24: qty 100

## 2013-02-24 MED ORDER — DILTIAZEM HCL 60 MG PO TABS
120.0000 mg | ORAL_TABLET | Freq: Every day | ORAL | Status: DC
  Administered 2013-02-25 – 2013-02-26 (×2): 120 mg via ORAL
  Filled 2013-02-24 (×2): qty 2

## 2013-02-24 MED ORDER — LEVALBUTEROL HCL 0.63 MG/3ML IN NEBU
1.2500 mg | INHALATION_SOLUTION | Freq: Four times a day (QID) | RESPIRATORY_TRACT | Status: DC
  Filled 2013-02-24: qty 6

## 2013-02-24 MED ORDER — SODIUM CHLORIDE 0.9 % IJ SOLN
3.0000 mL | Freq: Two times a day (BID) | INTRAMUSCULAR | Status: DC
  Administered 2013-02-25 – 2013-02-26 (×3): 3 mL via INTRAVENOUS

## 2013-02-24 MED ORDER — DICLOFENAC SODIUM 1 % TD GEL
4.0000 g | Freq: Three times a day (TID) | TRANSDERMAL | Status: DC | PRN
  Filled 2013-02-24: qty 100

## 2013-02-24 MED ORDER — ACETAMINOPHEN 650 MG RE SUPP
650.0000 mg | Freq: Once | RECTAL | Status: AC
  Administered 2013-02-24: 650 mg via RECTAL
  Filled 2013-02-24: qty 1

## 2013-02-24 MED ORDER — POTASSIUM CHLORIDE 10 MEQ/100ML IV SOLN: 10.0000 meq | INTRAVENOUS | Status: DC

## 2013-02-24 MED ORDER — ACETAMINOPHEN 325 MG PO TABS
650.0000 mg | ORAL_TABLET | Freq: Four times a day (QID) | ORAL | Status: DC | PRN
  Administered 2013-02-25: 650 mg via ORAL
  Filled 2013-02-24: qty 2

## 2013-02-24 MED ORDER — HYDROCODONE-ACETAMINOPHEN 5-325 MG PO TABS
1.0000 | ORAL_TABLET | Freq: Four times a day (QID) | ORAL | Status: DC | PRN
  Administered 2013-02-24 – 2013-02-26 (×3): 1 via ORAL
  Filled 2013-02-24 (×4): qty 1

## 2013-02-24 MED ORDER — LEVOFLOXACIN IN D5W 500 MG/100ML IV SOLN: 500.0000 mg | INTRAVENOUS | Status: DC

## 2013-02-24 MED ORDER — HYDROCODONE-ACETAMINOPHEN 5-325 MG PO TABS
1.0000 | ORAL_TABLET | Freq: Once | ORAL | Status: AC
  Administered 2013-02-24: 1 via ORAL

## 2013-02-24 MED ORDER — LEVOTHYROXINE SODIUM 100 MCG PO TABS
100.0000 ug | ORAL_TABLET | Freq: Every day | ORAL | Status: DC
  Administered 2013-02-25 – 2013-02-26 (×2): 100 ug via ORAL
  Filled 2013-02-24 (×2): qty 1

## 2013-02-24 MED ORDER — SODIUM CHLORIDE 0.9 % IV SOLN
INTRAVENOUS | Status: DC
  Administered 2013-02-24: 22:00:00 via INTRAVENOUS

## 2013-02-24 MED ORDER — ACETAMINOPHEN 650 MG RE SUPP: 650.0000 mg | Freq: Four times a day (QID) | RECTAL | Status: DC | PRN

## 2013-02-24 NOTE — H&P (Signed)
[facility] History and Physical  CAIRA POCHE ZTI:[id number] DOB: 08-25-1924 DOA: 02/24/2013   PCP: Delphina Cahill, MD  Specialists: She has been seen by Dr. Lattie Haw, cardiology in the past  Chief Complaint: Fever, cough  HPI: Tamara [name] is a 80 y.o. female with a past medical history dementia, atrial fibrillation on Coumadin, hypothyroidism, hypertension, who lives in [place] and has been living there since March of 2014. She presented yesterday after a fall over at the facility. She was found to have a fracture of her left wrist. A splint was placed and she was asked to follow up with an orthopedic surgeon. She is accompanied by her daughter-in-law today who went to visit her and she found that she was warm to touch. Patient was found to have a fever with a temperature of 102F. She was given a dose of Vicodin at 5 PM and the temperature decreased to 101.29F. They decided to send her to the hospital subsequently. She has been having a cough since this morning with the whitish expectoration. Patient is confused, but was able to answer a few questions. She denies any chest pain or shortness of breath. No nausea, vomiting, no diarrhea. No abdominal pain. No headaches. She also had back pain as a result of her fall yesterday and so she continues to have the same. Denies any diarrhea. History was limited due to her dementia.  Home Medications: Prior to Admission medications   Medication Sig Start Date End Date Taking? Authorizing Provider  alendronate (FOSAMAX) 70 MG tablet Take 70 mg by mouth once a week. Take with a full glass of water on an empty stomach.(on Sunday)   Yes Historical Provider, MD  diltiazem (CARDIZEM) 120 MG tablet Take 120 mg by mouth daily. 02/06/13  Yes Historical Provider, MD  escitalopram (LEXAPRO) 10 MG tablet Take 1 tablet (10 mg total) by mouth daily. 05/14/12  Yes Pricilla Larsson, NP  HYDROcodone-acetaminophen (NORCO/VICODIN) 5-325 MG per tablet Take 1  tablet by mouth every 6 (six) hours as needed for moderate pain. 02/23/13  Yes Janice Norrie, MD  levothyroxine (SYNTHROID, LEVOTHROID) 100 MCG tablet Take 100 mcg by mouth daily before breakfast.   Yes Historical Provider, MD  memantine (NAMENDA) 10 MG tablet Take 10 mg by mouth 2 (two) times daily. Administered at 8am and 5pm   Yes Historical Provider, MD  omeprazole (PRILOSEC) 40 MG capsule Take 1 capsule (40 mg total) by mouth daily. For heartburn 05/14/12  Yes Pricilla Larsson, NP  Polyethyl Glycol-Propyl Glycol (SYSTANE OP) Apply to eye 2 (two) times daily. 1 drop into both eyes   Yes Historical Provider, MD  potassium chloride (K-DUR) 10 MEQ tablet Take 10 mEq by mouth daily.  08/13/12  Yes Historical Provider, MD  warfarin (COUMADIN) 3 MG tablet Take 4.5 mg by mouth See admin instructions. Take one and one-half tablet (4.64m total) on Tuesdays, Thursdays, Saturdays, and Sundays ONLY   Yes Historical Provider, MD  diclofenac sodium (VOLTAREN) 1 % GEL Apply 4 g topically 3 (three) times daily as needed. pain    Historical Provider, MD  warfarin (COUMADIN) 6 MG tablet Take 6 mg by mouth See admin instructions. Take one tablet (678mtotal) on Mondays Wednesdays and Fridays ONLY    Historical Provider, MD    Allergies: No Known Allergies  Past Medical History: Past Medical History  Diagnosis Date  . Hypertension   . Atrial fibrillation     Onset well before 2004; Negative stress nuclear  study in 01/2003  . Osteoporosis   . Acute colitis 09/2011    C. difficile negative  . Chronic anticoagulation   . Hyperlipidemia   . Hiatal hernia     by CT in 2013; also noted was an adrenal adenoma, duodenal lipoma right ovarian cyst, post hysterectomy  . Pneumonia 09/2011    09/2011    Past Surgical History  Procedure Laterality Date  . Abdominal hysterectomy      No oophorectomy  . Appendectomy    . Colonoscopy  2005    Reportedly normal screening study    Social History: Patient lives in [place]. There's been no history of smoking, alcohol, or illicit drug use. She uses a walker to ambulate. She has had a few falls over the last 6 months according to the daughter-in-law.  Family History:  Family History  Problem Relation Age of Onset  . Heart attack Father   . Cancer Mother   . Hypertension Father      Review of Systems - unable to do due to her dementia  Physical Examination  Filed Vitals:   02/24/13 1802 02/24/13 1858 02/24/13 2203  BP: 108/46  139/77  Pulse: 76  87  Temp: 99.5 F (37.5 C) 102.4 F (39.1 C) 98.3 F (36.8 C)  TempSrc: Oral Rectal Oral  Resp: 18  18  Weight:   71.2 kg (156 lb 15.5 oz)  SpO2: 90%  97%    General appearance: alert, cooperative, appears stated age, distracted and no distress Head: Normocephalic, without obvious abnormality, atraumatic Eyes: conjunctivae/corneas clear. PERRL, EOM's intact. Throat: Dry mucous membranes Neck: no adenopathy, no carotid bruit, no JVD, supple, symmetrical, trachea midline and thyroid not enlarged, symmetric, no tenderness/mass/nodules Resp: Course breath sound bilaterally, with a few wheezes. No definite crackles Cardio: irregular rhythm, no murmur, click, rub or gallop GI: soft, non-tender; bowel sounds normal; no masses,  no organomegaly Extremities: Left arm is in a splint. She was able to move her fingers in the left hand. Pulses: 2+ and symmetric Skin: Skin color, texture, turgor normal. No rashes or lesions Lymph nodes: Cervical, supraclavicular, and axillary nodes normal. Neurologic: She is alert. She was confused about where she wants. She did not know the year. She knew the month. She knew that it wasn't here recently. She knew her on the above. No focal deficits otherwise. She was able to lift both her lower extremities off the bed without any difficulty.  Laboratory Data: Results for orders placed during the hospital encounter of 02/24/13 (from the past 48 hour(s))  CBC WITH DIFFERENTIAL      Status: Abnormal   Collection Time    02/24/13  6:35 PM      Result Value Range   WBC 6.6  4.0 - 10.5 K/uL   RBC 4.67  3.87 - 5.11 MIL/uL   Hemoglobin 14.1  12.0 - 15.0 g/dL   HCT 41.7  36.0 - 46.0 %   MCV 89.3  78.0 - 100.0 fL   MCH 30.2  26.0 - 34.0 pg   MCHC 33.8  30.0 - 36.0 g/dL   RDW 14.7  11.5 - 15.5 %   Platelets 194  150 - 400 K/uL   Neutrophils Relative % 79 (*) 43 - 77 %   Neutro Abs 5.2  1.7 - 7.7 K/uL   Lymphocytes Relative 6 (*) 12 - 46 %   Lymphs Abs 0.4 (*) 0.7 - 4.0 K/uL   Monocytes Relative 15 (*) 3 - 12 %  Monocytes Absolute 1.0  0.1 - 1.0 K/uL   Eosinophils Relative 0  0 - 5 %   Eosinophils Absolute 0.0  0.0 - 0.7 K/uL   Basophils Relative 0  0 - 1 %   Basophils Absolute 0.0  0.0 - 0.1 K/uL  COMPREHENSIVE METABOLIC PANEL     Status: Abnormal   Collection Time    02/24/13  6:35 PM      Result Value Range   Sodium 138  137 - 147 mEq/L   Comment: Please note change in reference range.   Potassium 3.5 (*) 3.7 - 5.3 mEq/L   Comment: Please note change in reference range.   Chloride 100  96 - 112 mEq/L   CO2 25  19 - 32 mEq/L   Glucose, Bld 117 (*) 70 - 99 mg/dL   BUN 11  6 - 23 mg/dL   Creatinine, Ser 0.71  0.50 - 1.10 mg/dL   Calcium 9.3  8.4 - 10.5 mg/dL   Total Protein 6.1  6.0 - 8.3 g/dL   Albumin 3.3 (*) 3.5 - 5.2 g/dL   AST 22  0 - 37 U/L   ALT 13  0 - 35 U/L   Alkaline Phosphatase 93  39 - 117 U/L   Total Bilirubin 0.5  0.3 - 1.2 mg/dL   GFR calc non Af Amer 76 (*) >90 mL/min   GFR calc Af Amer 88 (*) >90 mL/min   Comment: (NOTE)     The eGFR has been calculated using the CKD EPI equation.     This calculation has not been validated in all clinical situations.     eGFR's persistently <90 mL/min signify possible Chronic Kidney     Disease.  PROTIME-INR     Status: Abnormal   Collection Time    02/24/13  6:35 PM      Result Value Range   Prothrombin Time 19.4 (*) 11.6 - 15.2 seconds   INR 1.69 (*) 0.00 - 1.49  URINALYSIS, ROUTINE W REFLEX  MICROSCOPIC     Status: Abnormal   Collection Time    02/24/13  6:58 PM      Result Value Range   Color, Urine YELLOW  YELLOW   APPearance HAZY (*) CLEAR   Specific Gravity, Urine 1.020  1.005 - 1.030   pH 6.5  5.0 - 8.0   Glucose, UA NEGATIVE  NEGATIVE mg/dL   Hgb urine dipstick LARGE (*) NEGATIVE   Bilirubin Urine NEGATIVE  NEGATIVE   Ketones, ur NEGATIVE  NEGATIVE mg/dL   Protein, ur NEGATIVE  NEGATIVE mg/dL   Urobilinogen, UA 0.2  0.0 - 1.0 mg/dL   Nitrite NEGATIVE  NEGATIVE   Leukocytes, UA NEGATIVE  NEGATIVE  URINE MICROSCOPIC-ADD ON     Status: None   Collection Time    02/24/13  6:58 PM      Result Value Range   WBC, UA 0-2  <3 WBC/hpf   RBC / HPF 21-50  <3 RBC/hpf  INFLUENZA PANEL BY PCR     Status: Abnormal   Collection Time    02/24/13  8:49 PM      Result Value Range   Influenza A By PCR POSITIVE (*) NEGATIVE   Comment: RESULT CALLED TO, READ BACK BY AND VERIFIED WITH:     T.NEILSON AT 2220 ON 02/24/13 BY S.VANHOORNE   Influenza B By PCR NEGATIVE  NEGATIVE   H1N1 flu by pcr NOT DETECTED  NOT DETECTED   Comment:  The Xpert Flu assay (FDA approved for     nasal aspirates or washes and     nasopharyngeal swab specimens), is     intended as an aid in the diagnosis of     influenza and should not be used as     a sole basis for treatment.    Radiology Reports: Dg Chest 1 View  02/24/2013   CLINICAL DATA:  Fever, cough, congestion today  EXAM: CHEST - 1 VIEW  COMPARISON:  12/18/4 T  FINDINGS: Mild to moderate cardiac enlargement stable. Vascular pattern normal. Lungs clear except for mild subsegmental atelectasis in the lateral left lower lobe similar to the prior study.  IMPRESSION: No active disease.   Electronically Signed   By: Skipper Cliche M.D.   On: 02/24/2013 19:16   Dg Lumbar Spine Complete  02/23/2013   CLINICAL DATA:  Fall with back pain.  EXAM: LUMBAR SPINE - COMPLETE 4+ VIEW  COMPARISON:  KUB 09/27/2011 and CT abdomen/ pelvis 09/28/2011.   FINDINGS: Exam demonstrates severe spondylosis of the lumbar spine. There is mild decreased bone density. There is mild compression deformity of L1 unchanged. There is disc space narrowing at multiple levels unchanged. There subtle anterior wedging of the T12 not well seen on the prior exams. There is possible mild loss of posterior vertebral body height of L5 not well seen on the prior exams. Facet arthropathy is present. There is calcified plaque over the abdominal aorta.  IMPRESSION: Severe spondylosis of the lumbar spinal multilevel disc disease. Stable L1 compression fracture. Mild anterior wedging of T12 a mild posterior wedging of L5 not well seen on the prior exams as this is likely chronic although cannot exclude acute injury.   Electronically Signed   By: Marin Olp M.D.   On: 02/23/2013 21:44   Dg Wrist Complete Left  02/23/2013   CLINICAL DATA:  Fall.  Wrist injury and pain.  EXAM: LEFT WRIST - COMPLETE 3+ VIEW  COMPARISON:  None.  FINDINGS: Comminuted fracture of the distal radius is seen with intra-articular extension into the radiocarpal joint. There is mild impaction and dorsal angulation of the distal articular surface of the radius.  Nondisplaced ulnar styloid fracture also noted. No carpal bone fracture or dislocation identified. Osteoarthritis of the radiocarpal, STT joint complex, and base of thumb noted. Peripheral vascular calcification also demonstrated.  IMPRESSION: Comminuted fracture of the distal radius, with mild impaction and dorsal angulation.  Nondisplaced ulnar styloid fracture.  Wrist osteoarthritis.   Electronically Signed   By: Earle Gell M.D.   On: 02/23/2013 20:07   Ct Head Wo Contrast  02/24/2013   CLINICAL DATA:  Fall 1 day ago with altered mental status and weakness.  EXAM: CT HEAD WITHOUT CONTRAST  TECHNIQUE: Contiguous axial images were obtained from the base of the skull through the vertex without intravenous contrast.  COMPARISON:  None.  FINDINGS: Sinuses/Soft  tissues: No significant soft tissue swelling. Clear paranasal sinuses and mastoid air cells.  Intracranial: Advanced cerebral atrophy. Ventriculomegaly which is felt to be secondary. No mass lesion, hemorrhage, hydrocephalus, acute infarct, intra-axial, or extra-axial fluid collection.  IMPRESSION: No acute or posttraumatic deformity identified. Normal head for age.   Electronically Signed   By: Abigail Miyamoto M.D.   On: 02/24/2013 20:45   Ct Lumbar Spine Wo Contrast  02/24/2013   CLINICAL DATA:  Fall with mid back pain. Abnormal lumbar spine radiography  EXAM: CT LUMBAR SPINE WITHOUT CONTRAST  TECHNIQUE: Multidetector CT imaging of the  lumbar spine was performed without intravenous contrast administration. Multiplanar CT image reconstructions were also generated.  COMPARISON:  Abdominal CT 09/28/2011  FINDINGS: No evidence of acute fracture. There is stable mild compression deformity of the L1 vertebral body, with upper body sclerosis. Remote left L1 and L2 transverse process fractures, with callus. No perivertebral edema. There is diffuse spondylosis and degenerative facet change, without evidence of significant osseous canal or foraminal stenosis.  Incidental 2.5 cm adenoma in the right adrenal gland. There is a nonobstructive lipoma within the mid duodenum. A right adnexal/ovarian cyst is larger than before, now 5 x 4 cm as compared to 4 x 3 cm.  IMPRESSION: 1. No evidence of acute lumbar fracture. 2. Remote L1 superior endplate fracture. Remote L1 and L2 left transverse process fractures. 3. Right adnexal/ovarian cyst has enlarged 1 cm since 09/28/2011, now 5 cm.   Electronically Signed   By: Jorje Guild M.D.   On: 02/24/2013 00:01     Problem List  Principal Problem:   Influenza due to identified novel influenza A virus with other respiratory manifestations Active Problems:   Atrial fibrillation   Hypertension   Chronic anticoagulation   Fever   Acute bronchitis   Back pain   Left wrist  fracture   Assessment: This is 80 year old, Caucasian female, who presents with fever, cough. Influenza PCR is positive. So, this is all likely due to influenza. She could have a component of bronchitis as well. She has had a few falls recently with a fall yesterday resulting in left wrist fracture. She also has back pain with a CT that was unremarkable for any acute process.  Plan: #1 fever secondary to influenza: She'll be given Tamiflu. Due to possible concomitant acute bronchitis we will give her nebulizer treatments, as well as Levaquin.  #2 recent fall with left wrist fracture: Orthopedic surgeon will be consulted while she is in the hospital. PT and OT will be consulted as well.  #3 back pain as a result of a fall: CT of the lumbar spine did not show any acute process. Remote L1 superior end plate fracture was seen. She does not have any neurological deficits at this time. Pain control and physical therapy will be ordered. She does have a history of osteoporosis, which is contributing. She should continue her bisphosphonates, when she is discharged.  #4 history of dementia: Continue with Namenda.  #5 atrial fibrillation on chronic anticoagulation: Continue diltiazem. Continue with warfarin per pharmacy. INR is currently subtherapeutic. Anticipate some drug interaction.  #6 dehydration: She'll be given gentle IV hydration. EF back in March of 2014 was normal. However, she still could have diastolic dysfunction.  Potassium will be repleted. Microscopic hematuria seen in the UA can be addressed by her PCP.  DVT Prophylaxis: On warfarin Code Status: DO NOT RESUSCITATE based on my conversation with daughter-in-law Family Communication: Discussed with the daughter-in-law  Disposition Plan: Admit to telemetry   Further management decisions will depend on results of further testing and patient's response to treatment.  [facility]  [facility] Pager [phone]  If 7PM-7AM,  please contact night-coverage [email or web address] Password [facility]  02/24/2013, 10:29 PM

## 2013-02-24 NOTE — ED Notes (Signed)
Pt here from [facility] for evaluation of fever and cough

## 2013-02-24 NOTE — ED Provider Notes (Addendum)
CSN: [id number]     Arrival date & time 02/24/13  1756 History   This chart was scribed for Ward Givens, MD by Bennett Scrape, ED Scribe. This patient was seen in room [facility]2/[facility] and the patient's care was started at 6:15 PM.   Chief Complaint  Patient presents with  . Fever   Level 5 caveat-AMS  The history is provided by a relative. No language interpreter was used.    HPI Comments: Tamara [name] is a 80 y.o. female brought in by ambulance from [facility], who presents to the Emergency Department complaining of a fever was 102 this afternoon with associated cough that family member states is "rattling". Temperature is 99.5 in the ED after OTC medication was given at the SNF.  Pt ate one banana for breakfast and some meatloaf and a spoonful of mashed potatoes for dinner which is much less than normal. Family member states that the pt denies pain but will grimace when coughing. Family denies the pt needing any prior breathing treatments.   Pt was seen in the ED last night by myself after a fall. She is considerably altered. She does not know where she is or who I am.  PCP Dr Margo Aye  Past Medical History  Diagnosis Date  . Hypertension   . Atrial fibrillation     Onset well before 2004; Negative stress nuclear study in 01/2003  . Osteoporosis   . Acute colitis 09/2011    C. difficile negative  . Chronic anticoagulation   . Hyperlipidemia   . Hiatal hernia     by CT in 2013; also noted was an adrenal adenoma, duodenal lipoma right ovarian cyst, post hysterectomy  . Pneumonia 09/2011    09/2011   Past Surgical History  Procedure Laterality Date  . Abdominal hysterectomy      No oophorectomy  . Appendectomy    . Colonoscopy  2005    Reportedly normal screening study   Family History  Problem Relation Age of Onset  . Heart attack Father   . Cancer Mother   . Hypertension Father    History  Substance Use Topics  . Smoking status: Never Smoker   . Smokeless tobacco:  Never Used  . Alcohol Use: No   Lives in SNF  No OB history provided.   Review of Systems  Unable to perform ROS: Mental status change    Allergies  Review of patient's allergies indicates no known allergies.  Home Medications   Current Outpatient Rx  Name  Route  Sig  Dispense  Refill  . alendronate (FOSAMAX) 70 MG tablet   Oral   Take 70 mg by mouth once a week. Take with a full glass of water on an empty stomach.(on Sunday)         . diltiazem (CARDIZEM) 120 MG tablet   Oral   Take 120 mg by mouth daily.         Marland Kitchen escitalopram (LEXAPRO) 10 MG tablet   Oral   Take 1 tablet (10 mg total) by mouth daily.   30 tablet   3   . HYDROcodone-acetaminophen (NORCO/VICODIN) 5-325 MG per tablet   Oral   Take 1 tablet by mouth every 6 (six) hours as needed for moderate pain.   20 tablet   0   . levothyroxine (SYNTHROID, LEVOTHROID) 100 MCG tablet   Oral   Take 100 mcg by mouth daily before breakfast.         . memantine (  NAMENDA) 10 MG tablet   Oral   Take 10 mg by mouth 2 (two) times daily. Administered at 8am and 5pm         . omeprazole (PRILOSEC) 40 MG capsule   Oral   Take 1 capsule (40 mg total) by mouth daily. For heartburn   30 capsule   3   . Polyethyl Glycol-Propyl Glycol (SYSTANE OP)   Ophthalmic   Apply to eye 2 (two) times daily. 1 drop into both eyes         . potassium chloride (K-DUR) 10 MEQ tablet   Oral   Take 10 mEq by mouth daily.          Marland Kitchen warfarin (COUMADIN) 3 MG tablet   Oral   Take 4.5 mg by mouth See admin instructions. Take one and one-half tablet (4.5mg  total) on Tuesdays, Thursdays, Saturdays, and Sundays ONLY         . diclofenac sodium (VOLTAREN) 1 % GEL   Topical   Apply 4 g topically 3 (three) times daily as needed. pain         . warfarin (COUMADIN) 6 MG tablet   Oral   Take 6 mg by mouth See admin instructions. Take one tablet (6mg  total) on Mondays Wednesdays and Fridays ONLY          Triage Vitals:   BP 168/82  Pulse 82  Temp(Src) 99.5 F (37.5 C) (Oral)  Resp 16  SpO2 96% Rectal temp 102.4  Vital signs normal except for fever   Physical Exam  Nursing note and vitals reviewed. Constitutional: She appears well-developed and well-nourished.  Non-toxic appearance. She does not appear ill. No distress.  Pt is sleeply and is hard to arouse  HENT:  Head: Normocephalic and atraumatic.  Right Ear: External ear normal.  Left Ear: External ear normal.  Nose: Nose normal. No mucosal edema or rhinorrhea.  Mouth/Throat: Mucous membranes are normal. No dental abscesses or uvula swelling.  Eyes: Conjunctivae and EOM are normal.  Neck: Normal range of motion and full passive range of motion without pain. Neck supple.  Cardiovascular: Normal rate, regular rhythm and normal heart sounds.  Exam reveals no gallop and no friction rub.   No murmur heard. Pulmonary/Chest: Effort normal. No respiratory distress. She has no wheezes. She has rhonchi. She has no rales. She exhibits no tenderness and no crepitus.  Coarse breath sounds and few rhonchi   Abdominal: Soft. Normal appearance and bowel sounds are normal. She exhibits no distension. There is no tenderness. There is no rebound and no guarding.  Musculoskeletal: Normal range of motion. She exhibits no edema and no tenderness.  Moves all extremities well.   Patient's sugar tong splint placed last night is still in place.  Neurological: She has normal strength. No cranial nerve deficit.  Doesn't recognize me from last night. Doesn't know where she is.  Change in mentation from last night when pt was very talkative and oriented.   Skin: Skin is warm, dry and intact. No rash noted. No erythema. No pallor.    ED Course  Procedures (including critical care time)  Medications  vancomycin (VANCOCIN) IVPB 1000 mg/200 mL premix (not administered)  piperacillin-tazobactam (ZOSYN) IVPB 3.375 g (3.375 g Intravenous New Bag/Given 02/24/13 2011)   acetaminophen (TYLENOL) suppository 650 mg (not administered)     DIAGNOSTIC STUDIES: Oxygen Saturation is 90% on RA, low by my interpretation.    COORDINATION OF CARE: 6:19 PM-CXR, CBC, CMP and UA with pt's  relative and she agreed.   Patient remains less responsive after getting IV fluids. Her fever was treated with acetaminophen.  20:11 Dr Rito Ehrlich, admit to tele, requests influenza screen and head CT scan.   Results for orders placed during the hospital encounter of 02/24/13  CBC WITH DIFFERENTIAL      Result Value Range   WBC 6.6  4.0 - 10.5 K/uL   RBC 4.67  3.87 - 5.11 MIL/uL   Hemoglobin 14.1  12.0 - 15.0 g/dL   HCT 16.1  09.6 - 04.5 %   MCV 89.3  78.0 - 100.0 fL   MCH 30.2  26.0 - 34.0 pg   MCHC 33.8  30.0 - 36.0 g/dL   RDW 40.9  81.1 - 91.4 %   Platelets 194  150 - 400 K/uL   Neutrophils Relative % 79 (*) 43 - 77 %   Neutro Abs 5.2  1.7 - 7.7 K/uL   Lymphocytes Relative 6 (*) 12 - 46 %   Lymphs Abs 0.4 (*) 0.7 - 4.0 K/uL   Monocytes Relative 15 (*) 3 - 12 %   Monocytes Absolute 1.0  0.1 - 1.0 K/uL   Eosinophils Relative 0  0 - 5 %   Eosinophils Absolute 0.0  0.0 - 0.7 K/uL   Basophils Relative 0  0 - 1 %   Basophils Absolute 0.0  0.0 - 0.1 K/uL  COMPREHENSIVE METABOLIC PANEL      Result Value Range   Sodium 138  137 - 147 mEq/L   Potassium 3.5 (*) 3.7 - 5.3 mEq/L   Chloride 100  96 - 112 mEq/L   CO2 25  19 - 32 mEq/L   Glucose, Bld 117 (*) 70 - 99 mg/dL   BUN 11  6 - 23 mg/dL   Creatinine, Ser 7.82  0.50 - 1.10 mg/dL   Calcium 9.3  8.4 - 95.6 mg/dL   Total Protein 6.1  6.0 - 8.3 g/dL   Albumin 3.3 (*) 3.5 - 5.2 g/dL   AST 22  0 - 37 U/L   ALT 13  0 - 35 U/L   Alkaline Phosphatase 93  39 - 117 U/L   Total Bilirubin 0.5  0.3 - 1.2 mg/dL   GFR calc non Af Amer 76 (*) >90 mL/min   GFR calc Af Amer 88 (*) >90 mL/min  URINALYSIS, ROUTINE W REFLEX MICROSCOPIC      Result Value Range   Color, Urine YELLOW  YELLOW   APPearance HAZY (*) CLEAR   Specific  Gravity, Urine 1.020  1.005 - 1.030   pH 6.5  5.0 - 8.0   Glucose, UA NEGATIVE  NEGATIVE mg/dL   Hgb urine dipstick LARGE (*) NEGATIVE   Bilirubin Urine NEGATIVE  NEGATIVE   Ketones, ur NEGATIVE  NEGATIVE mg/dL   Protein, ur NEGATIVE  NEGATIVE mg/dL   Urobilinogen, UA 0.2  0.0 - 1.0 mg/dL   Nitrite NEGATIVE  NEGATIVE   Leukocytes, UA NEGATIVE  NEGATIVE  PROTIME-INR      Result Value Range   Prothrombin Time 19.4 (*) 11.6 - 15.2 seconds   INR 1.69 (*) 0.00 - 1.49  URINE MICROSCOPIC-ADD ON      Result Value Range   WBC, UA 0-2  <3 WBC/hpf   RBC / HPF 21-50  <3 RBC/hpf  INFLUENZA PANEL BY PCR      Result Value Range   Influenza A By PCR POSITIVE (*) NEGATIVE   Influenza B By PCR NEGATIVE  NEGATIVE  H1N1 flu by pcr NOT DETECTED  NOT DETECTED    Laboratory interpretation all normal except subtherapeutic INR, + flu   Dg Chest 1 View  02/24/2013   CLINICAL DATA:  Fever, cough, congestion today  EXAM: CHEST - 1 VIEW  COMPARISON:  12/18/4 T  FINDINGS: Mild to moderate cardiac enlargement stable. Vascular pattern normal. Lungs clear except for mild subsegmental atelectasis in the lateral left lower lobe similar to the prior study.  IMPRESSION: No active disease.   Electronically Signed   By: Esperanza Heiraymond  Rubner M.D.   On: 02/24/2013 19:16   Dg Chest 2 View  02/07/2013   CLINICAL DATA:  Fatigue  EXAM: CHEST  2 VIEW  COMPARISON:  04/02/2012  FINDINGS: The lungs are hyperinflated likely secondary to COPD. There is no focal parenchymal opacity, pleural effusion, or pneumothorax. Stable cardiomegaly.  Mild degenerative disc disease throughout the thoracic spine.  IMPRESSION: No active cardiopulmonary disease.   Electronically Signed   By: Elige KoHetal  Patel   On: 02/07/2013 16:15   Dg Lumbar Spine Complete  02/23/2013   CLINICAL DATA:  Fall with back pain.  EXAM: LUMBAR SPINE - COMPLETE 4+ VIEW  COMPARISON:  KUB 09/27/2011 and CT abdomen/ pelvis 09/28/2011.  FINDINGS: Exam demonstrates severe spondylosis  of the lumbar spine. There is mild decreased bone density. There is mild compression deformity of L1 unchanged. There is disc space narrowing at multiple levels unchanged. There subtle anterior wedging of the T12 not well seen on the prior exams. There is possible mild loss of posterior vertebral body height of L5 not well seen on the prior exams. Facet arthropathy is present. There is calcified plaque over the abdominal aorta.  IMPRESSION: Severe spondylosis of the lumbar spinal multilevel disc disease. Stable L1 compression fracture. Mild anterior wedging of T12 a mild posterior wedging of L5 not well seen on the prior exams as this is likely chronic although cannot exclude acute injury.   Electronically Signed   By: Elberta Fortisaniel  Boyle M.D.   On: 02/23/2013 21:44   Dg Wrist Complete Left  02/23/2013   CLINICAL DATA:  Fall.  Wrist injury and pain.  EXAM: LEFT WRIST - COMPLETE 3+ VIEW  COMPARISON:  None.  FINDINGS: Comminuted fracture of the distal radius is seen with intra-articular extension into the radiocarpal joint. There is mild impaction and dorsal angulation of the distal articular surface of the radius.  Nondisplaced ulnar styloid fracture also noted. No carpal bone fracture or dislocation identified. Osteoarthritis of the radiocarpal, STT joint complex, and base of thumb noted. Peripheral vascular calcification also demonstrated.  IMPRESSION: Comminuted fracture of the distal radius, with mild impaction and dorsal angulation.  Nondisplaced ulnar styloid fracture.  Wrist osteoarthritis.   Electronically Signed   By: Myles RosenthalJohn  Stahl M.D.   On: 02/23/2013 20:07   Ct Head Wo Contrast  02/24/2013   CLINICAL DATA:  Fall 1 day ago with altered mental status and weakness.  EXAM: CT HEAD WITHOUT CONTRAST  TECHNIQUE: Contiguous axial images were obtained from the base of the skull through the vertex without intravenous contrast.  COMPARISON:  None.  FINDINGS: Sinuses/Soft tissues: No significant soft tissue swelling.  Clear paranasal sinuses and mastoid air cells.  Intracranial: Advanced cerebral atrophy. Ventriculomegaly which is felt to be secondary. No mass lesion, hemorrhage, hydrocephalus, acute infarct, intra-axial, or extra-axial fluid collection.  IMPRESSION: No acute or posttraumatic deformity identified. Normal head for age.   Electronically Signed   By: Jeronimo GreavesKyle  Talbot M.D.   On: 02/24/2013  20:45   Ct Lumbar Spine Wo Contrast  02/24/2013   CLINICAL DATA:  Fall with mid back pain. Abnormal lumbar spine radiography  EXAM: CT LUMBAR SPINE WITHOUT CONTRAST  TECHNIQUE: Multidetector CT imaging of the lumbar spine was performed without intravenous contrast administration. Multiplanar CT image reconstructions were also generated.  COMPARISON:  Abdominal CT 09/28/2011  FINDINGS: No evidence of acute fracture. There is stable mild compression deformity of the L1 vertebral body, with upper body sclerosis. Remote left L1 and L2 transverse process fractures, with callus. No perivertebral edema. There is diffuse spondylosis and degenerative facet change, without evidence of significant osseous canal or foraminal stenosis.  Incidental 2.5 cm adenoma in the right adrenal gland. There is a nonobstructive lipoma within the mid duodenum. A right adnexal/ovarian cyst is larger than before, now 5 x 4 cm as compared to 4 x 3 cm.  IMPRESSION: 1. No evidence of acute lumbar fracture. 2. Remote L1 superior endplate fracture. Remote L1 and L2 left transverse process fractures. 3. Right adnexal/ovarian cyst has enlarged 1 cm since 09/28/2011, now 5 cm.   Electronically Signed   By: Tiburcio Pea M.D.   On: 02/24/2013 00:01      EKG Interpretation   None       MDM   1. Fever   2. Bronchitis   3. Altered mental status    Plan admission   Devoria Albe, MD, FACEP   I personally performed the services described in this documentation, which was scribed in my presence. The recorded information has been reviewed and  considered.  Devoria Albe, MD, FACEP    Ward Givens, MD 02/24/13 Alvina Filbert  Ward Givens, MD 02/25/13 [phone]

## 2013-02-25 DIAGNOSIS — E876 Hypokalemia: Secondary | ICD-10-CM | POA: Diagnosis present

## 2013-02-25 DIAGNOSIS — R509 Fever, unspecified: Secondary | ICD-10-CM

## 2013-02-25 DIAGNOSIS — R319 Hematuria, unspecified: Secondary | ICD-10-CM | POA: Diagnosis present

## 2013-02-25 LAB — COMPREHENSIVE METABOLIC PANEL
ALT: 11 U/L (ref 0–35)
AST: 24 U/L (ref 0–37)
Albumin: 2.9 g/dL — ABNORMAL LOW (ref 3.5–5.2)
Alkaline Phosphatase: 79 U/L (ref 39–117)
BUN: 10 mg/dL (ref 6–23)
CHLORIDE: 102 meq/L (ref 96–112)
CO2: 24 mEq/L (ref 19–32)
Calcium: 8.7 mg/dL (ref 8.4–10.5)
Creatinine, Ser: 0.63 mg/dL (ref 0.50–1.10)
GFR calc non Af Amer: 79 mL/min — ABNORMAL LOW (ref >90)
Glucose, Bld: 99 mg/dL (ref 70–99)
Potassium: 4 mEq/L (ref 3.7–5.3)
SODIUM: 137 meq/L (ref 137–147)
Total Bilirubin: 0.5 mg/dL (ref 0.3–1.2)
Total Protein: 5.9 g/dL — ABNORMAL LOW (ref 6.0–8.3)

## 2013-02-25 LAB — CBC
HCT: 41.1 % (ref 36.0–46.0)
Hemoglobin: 13.4 g/dL (ref 12.0–15.0)
MCH: 29.7 pg (ref 26.0–34.0)
MCHC: 32.6 g/dL (ref 30.0–36.0)
MCV: 91.1 fL (ref 78.0–100.0)
Platelets: 177 10*3/uL (ref 150–400)
RBC: 4.51 MIL/uL (ref 3.87–5.11)
RDW: 15 % (ref 11.5–15.5)
WBC: 6.1 10*3/uL (ref 4.0–10.5)

## 2013-02-25 MED ORDER — WARFARIN - PHARMACIST DOSING INPATIENT: Status: DC

## 2013-02-25 MED ORDER — LEVOFLOXACIN IN D5W 500 MG/100ML IV SOLN
INTRAVENOUS | Status: AC
  Filled 2013-02-25: qty 100

## 2013-02-25 MED ORDER — WARFARIN SODIUM 6 MG PO TABS
6.0000 mg | ORAL_TABLET | Freq: Once | ORAL | Status: AC
  Administered 2013-02-25: 6 mg via ORAL
  Filled 2013-02-25: qty 1

## 2013-02-25 MED ORDER — LEVALBUTEROL HCL 0.63 MG/3ML IN NEBU
0.6300 mg | INHALATION_SOLUTION | Freq: Four times a day (QID) | RESPIRATORY_TRACT | Status: DC
  Administered 2013-02-25 – 2013-02-26 (×4): 0.63 mg via RESPIRATORY_TRACT
  Filled 2013-02-25 (×4): qty 3

## 2013-02-25 NOTE — Clinical Social Work Note (Signed)
[facility] pt and daughter-in-law's preference. Facility notified of bed acceptance. Initiated [employer] authorization. [facility] of need for higher level of care with family's permission.  Derenda FennelKara Vergie Zahm, [place] [phone]

## 2013-02-25 NOTE — Consult Note (Signed)
Reason for Consult:Fracture of the left wrist Referring Physician: Hospitalist  Tamara [name] is an 80 y.o. female.  HPI: I was consulted this mid morning concerning this patient who fell Saturday at a local rest home.  She has had multiple falls recently at the rest home.  She has some confusion at times and early dementia.  She has also had some breathing problems.  She is chronically on Coumadin from atrial fibrillation.    History is obtained from her daughter-in-law who is present and from the current chart.  She is not very talkative today.  She is not in pain.  Physical therapy is present to evaluate her now.  I have told the patient and her daughter-in-law about the fracture of the distal radius on the left.  She has comminution and impaction of the fracture.  Alignment is fair as it is.  I have discussed letting it heal as is but she would have shortening of the distal radius and deformity.  I can arrange for a hand surgeon to evaluate her and see if she is a candidate for surgery.  She has intra-articular fracture and osteoporosis. She is on Coumadin and would need to be off that for several days if surgery is considered.  She would have to get medical clearance first and cardiology clearance.  Her daughter-in-law will talk with family and let me know what they prefer.  Past Medical History  Diagnosis Date  . Hypertension   . Atrial fibrillation     Onset well before 2004; Negative stress nuclear study in 01/2003  . Osteoporosis   . Acute colitis 09/2011    C. difficile negative  . Chronic anticoagulation   . Hyperlipidemia   . Hiatal hernia     by CT in 2013; also noted was an adrenal adenoma, duodenal lipoma right ovarian cyst, post hysterectomy  . Pneumonia 09/2011    09/2011    Past Surgical History  Procedure Laterality Date  . Abdominal hysterectomy      No oophorectomy  . Appendectomy    . Colonoscopy  2005    Reportedly normal screening study    Family History    Problem Relation Age of Onset  . Heart attack Father   . Cancer Mother   . Hypertension Father     Social History:  reports that she has never smoked. She has never used smokeless tobacco. She reports that she does not drink alcohol or use illicit drugs.  Allergies: No Known Allergies  Medications: I have reviewed the patient's current medications.  Results for orders placed during the hospital encounter of 02/24/13 (from the past 48 hour(s))  CULTURE, BLOOD (ROUTINE X 2)     Status: None   Collection Time    02/24/13  6:34 PM      Result Value Range   Specimen Description BLOOD RIGHT HAND     Special Requests BOTTLES DRAWN AEROBIC AND ANAEROBIC 8CC EACH     Culture NO GROWTH 1 DAY     Report Status PENDING    CBC WITH DIFFERENTIAL     Status: Abnormal   Collection Time    02/24/13  6:35 PM      Result Value Range   WBC 6.6  4.0 - 10.5 K/uL   RBC 4.67  3.87 - 5.11 MIL/uL   Hemoglobin 14.1  12.0 - 15.0 g/dL   HCT 41.7  36.0 - 46.0 %   MCV 89.3  78.0 - 100.0 fL   MCH  30.2  26.0 - 34.0 pg   MCHC 33.8  30.0 - 36.0 g/dL   RDW 14.7  11.5 - 15.5 %   Platelets 194  150 - 400 K/uL   Neutrophils Relative % 79 (*) 43 - 77 %   Neutro Abs 5.2  1.7 - 7.7 K/uL   Lymphocytes Relative 6 (*) 12 - 46 %   Lymphs Abs 0.4 (*) 0.7 - 4.0 K/uL   Monocytes Relative 15 (*) 3 - 12 %   Monocytes Absolute 1.0  0.1 - 1.0 K/uL   Eosinophils Relative 0  0 - 5 %   Eosinophils Absolute 0.0  0.0 - 0.7 K/uL   Basophils Relative 0  0 - 1 %   Basophils Absolute 0.0  0.0 - 0.1 K/uL  COMPREHENSIVE METABOLIC PANEL     Status: Abnormal   Collection Time    02/24/13  6:35 PM      Result Value Range   Sodium 138  137 - 147 mEq/L   Comment: Please note change in reference range.   Potassium 3.5 (*) 3.7 - 5.3 mEq/L   Comment: Please note change in reference range.   Chloride 100  96 - 112 mEq/L   CO2 25  19 - 32 mEq/L   Glucose, Bld 117 (*) 70 - 99 mg/dL   BUN 11  6 - 23 mg/dL   Creatinine, Ser 0.71  0.50  - 1.10 mg/dL   Calcium 9.3  8.4 - 10.5 mg/dL   Total Protein 6.1  6.0 - 8.3 g/dL   Albumin 3.3 (*) 3.5 - 5.2 g/dL   AST 22  0 - 37 U/L   ALT 13  0 - 35 U/L   Alkaline Phosphatase 93  39 - 117 U/L   Total Bilirubin 0.5  0.3 - 1.2 mg/dL   GFR calc non Af Amer 76 (*) >90 mL/min   GFR calc Af Amer 88 (*) >90 mL/min   Comment: (NOTE)     The eGFR has been calculated using the CKD EPI equation.     This calculation has not been validated in all clinical situations.     eGFR's persistently <90 mL/min signify possible Chronic Kidney     Disease.  PROTIME-INR     Status: Abnormal   Collection Time    02/24/13  6:35 PM      Result Value Range   Prothrombin Time 19.4 (*) 11.6 - 15.2 seconds   INR 1.69 (*) 0.00 - 1.49  CULTURE, BLOOD (ROUTINE X 2)     Status: None   Collection Time    02/24/13  6:45 PM      Result Value Range   Specimen Description BLOOD RIGHT ARM     Special Requests BOTTLES DRAWN AEROBIC ONLY 6CC     Culture NO GROWTH 1 DAY     Report Status PENDING    URINALYSIS, ROUTINE W REFLEX MICROSCOPIC     Status: Abnormal   Collection Time    02/24/13  6:58 PM      Result Value Range   Color, Urine YELLOW  YELLOW   APPearance HAZY (*) CLEAR   Specific Gravity, Urine 1.020  1.005 - 1.030   pH 6.5  5.0 - 8.0   Glucose, UA NEGATIVE  NEGATIVE mg/dL   Hgb urine dipstick LARGE (*) NEGATIVE   Bilirubin Urine NEGATIVE  NEGATIVE   Ketones, ur NEGATIVE  NEGATIVE mg/dL   Protein, ur NEGATIVE  NEGATIVE mg/dL   Urobilinogen, UA  0.2  0.0 - 1.0 mg/dL   Nitrite NEGATIVE  NEGATIVE   Leukocytes, UA NEGATIVE  NEGATIVE  URINE MICROSCOPIC-ADD ON     Status: None   Collection Time    02/24/13  6:58 PM      Result Value Range   WBC, UA 0-2  <3 WBC/hpf   RBC / HPF 21-50  <3 RBC/hpf  INFLUENZA PANEL BY PCR     Status: Abnormal   Collection Time    02/24/13  8:49 PM      Result Value Range   Influenza A By PCR POSITIVE (*) NEGATIVE   Comment: RESULT CALLED TO, READ BACK BY AND VERIFIED  WITH:     T.NEILSON AT 2220 ON 02/24/13 BY S.VANHOORNE   Influenza B By PCR NEGATIVE  NEGATIVE   H1N1 flu by pcr NOT DETECTED  NOT DETECTED   Comment:            The Xpert Flu assay (FDA approved for     nasal aspirates or washes and     nasopharyngeal swab specimens), is     intended as an aid in the diagnosis of     influenza and should not be used as     a sole basis for treatment.  COMPREHENSIVE METABOLIC PANEL     Status: Abnormal   Collection Time    02/25/13  4:53 AM      Result Value Range   Sodium 137  137 - 147 mEq/L   Comment: Please note change in reference range.   Potassium 4.0  3.7 - 5.3 mEq/L   Comment: Please note change in reference range.   Chloride 102  96 - 112 mEq/L   CO2 24  19 - 32 mEq/L   Glucose, Bld 99  70 - 99 mg/dL   BUN 10  6 - 23 mg/dL   Creatinine, Ser 0.63  0.50 - 1.10 mg/dL   Calcium 8.7  8.4 - 10.5 mg/dL   Total Protein 5.9 (*) 6.0 - 8.3 g/dL   Albumin 2.9 (*) 3.5 - 5.2 g/dL   AST 24  0 - 37 U/L   ALT 11  0 - 35 U/L   Alkaline Phosphatase 79  39 - 117 U/L   Total Bilirubin 0.5  0.3 - 1.2 mg/dL   GFR calc non Af Amer 79 (*) >90 mL/min   GFR calc Af Amer >90  >90 mL/min   Comment: (NOTE)     The eGFR has been calculated using the CKD EPI equation.     This calculation has not been validated in all clinical situations.     eGFR's persistently <90 mL/min signify possible Chronic Kidney     Disease.  CBC     Status: None   Collection Time    02/25/13  4:53 AM      Result Value Range   WBC 6.1  4.0 - 10.5 K/uL   RBC 4.51  3.87 - 5.11 MIL/uL   Hemoglobin 13.4  12.0 - 15.0 g/dL   HCT 41.1  36.0 - 46.0 %   MCV 91.1  78.0 - 100.0 fL   MCH 29.7  26.0 - 34.0 pg   MCHC 32.6  30.0 - 36.0 g/dL   RDW 15.0  11.5 - 15.5 %   Platelets 177  150 - 400 K/uL    Dg Chest 1 View  02/24/2013   CLINICAL DATA:  Fever, cough, congestion today  EXAM: CHEST - 1 VIEW  COMPARISON:  12/18/4 T  FINDINGS: Mild to moderate cardiac enlargement stable. Vascular pattern  normal. Lungs clear except for mild subsegmental atelectasis in the lateral left lower lobe similar to the prior study.  IMPRESSION: No active disease.   Electronically Signed   By: Skipper Cliche M.D.   On: 02/24/2013 19:16   Dg Lumbar Spine Complete  02/23/2013   CLINICAL DATA:  Fall with back pain.  EXAM: LUMBAR SPINE - COMPLETE 4+ VIEW  COMPARISON:  [facility] 09/27/2011 and CT abdomen/ pelvis 09/28/2011.  FINDINGS: Exam demonstrates severe spondylosis of the lumbar spine. There is mild decreased bone density. There is mild compression deformity of L1 unchanged. There is disc space narrowing at multiple levels unchanged. There subtle anterior wedging of the T12 not well seen on the prior exams. There is possible mild loss of posterior vertebral body height of L5 not well seen on the prior exams. Facet arthropathy is present. There is calcified plaque over the abdominal aorta.  IMPRESSION: Severe spondylosis of the lumbar spinal multilevel disc disease. Stable L1 compression fracture. Mild anterior wedging of T12 a mild posterior wedging of L5 not well seen on the prior exams as this is likely chronic although cannot exclude acute injury.   Electronically Signed   By: Marin Olp M.D.   On: 02/23/2013 21:44   Dg Wrist Complete Left  02/23/2013   CLINICAL DATA:  Fall.  Wrist injury and pain.  EXAM: LEFT WRIST - COMPLETE 3+ VIEW  COMPARISON:  None.  FINDINGS: Comminuted fracture of the distal radius is seen with intra-articular extension into the radiocarpal joint. There is mild impaction and dorsal angulation of the distal articular surface of the radius.  Nondisplaced ulnar styloid fracture also noted. No carpal bone fracture or dislocation identified. Osteoarthritis of the radiocarpal, STT joint complex, and base of thumb noted. Peripheral vascular calcification also demonstrated.  IMPRESSION: Comminuted fracture of the distal radius, with mild impaction and dorsal angulation.  Nondisplaced ulnar styloid  fracture.  Wrist osteoarthritis.   Electronically Signed   By: Earle Gell M.D.   On: 02/23/2013 20:07   Ct Head Wo Contrast  02/24/2013   CLINICAL DATA:  Fall 1 day ago with altered mental status and weakness.  EXAM: CT HEAD WITHOUT CONTRAST  TECHNIQUE: Contiguous axial images were obtained from the base of the skull through the vertex without intravenous contrast.  COMPARISON:  None.  FINDINGS: Sinuses/Soft tissues: No significant soft tissue swelling. Clear paranasal sinuses and mastoid air cells.  Intracranial: Advanced cerebral atrophy. Ventriculomegaly which is felt to be secondary. No mass lesion, hemorrhage, hydrocephalus, acute infarct, intra-axial, or extra-axial fluid collection.  IMPRESSION: No acute or posttraumatic deformity identified. Normal head for age.   Electronically Signed   By: Abigail Miyamoto M.D.   On: 02/24/2013 20:45   Ct Lumbar Spine Wo Contrast  02/24/2013   CLINICAL DATA:  Fall with mid back pain. Abnormal lumbar spine radiography  EXAM: CT LUMBAR SPINE WITHOUT CONTRAST  TECHNIQUE: Multidetector CT imaging of the lumbar spine was performed without intravenous contrast administration. Multiplanar CT image reconstructions were also generated.  COMPARISON:  Abdominal CT 09/28/2011  FINDINGS: No evidence of acute fracture. There is stable mild compression deformity of the L1 vertebral body, with upper body sclerosis. Remote left L1 and L2 transverse process fractures, with callus. No perivertebral edema. There is diffuse spondylosis and degenerative facet change, without evidence of significant osseous canal or foraminal stenosis.  Incidental 2.5 cm adenoma in the right adrenal gland. There is  a nonobstructive lipoma within the mid duodenum. A right adnexal/ovarian cyst is larger than before, now 5 x 4 cm as compared to 4 x 3 cm.  IMPRESSION: 1. No evidence of acute lumbar fracture. 2. Remote L1 superior endplate fracture. Remote L1 and L2 left transverse process fractures. 3. Right  adnexal/ovarian cyst has enlarged 1 cm since 09/28/2011, now 5 cm.   Electronically Signed   By: Jorje Guild M.D.   On: 02/24/2013 00:01    Review of Systems  Respiratory:       Influenza  Cardiovascular:       Atrial Fibrillation, on Coumadin, hypertension, hyperlipidemia  Musculoskeletal: Positive for falls (She fell and hurt the left wrist.  She has had multiple falls recently.).  Psychiatric/Behavioral:       Confusion, early dementia   Blood pressure 166/82, pulse 62, temperature 98.1 F (36.7 C), temperature source Oral, resp. rate 18, height $RemoveBe'5\' 10"'iYIZFsvHO$  (1.778 m), weight 71.2 kg (156 lb 15.5 oz), SpO2 98.00%. Physical Exam  Constitutional: She appears well-developed and well-nourished.  HENT:  Head: Normocephalic and atraumatic.  Eyes: Conjunctivae and EOM are normal. Pupils are equal, round, and reactive to light.  Neck: Normal range of motion. Neck supple.  Cardiovascular: Intact distal pulses.   Respiratory: She has wheezes.  GI: Soft.  Musculoskeletal: She exhibits tenderness (Pain left wrist with sugar tong splint in place. NV is intact.  ).       Left wrist: She exhibits decreased range of motion, tenderness, swelling and deformity.       Arms: Neurological: She is alert. She has normal reflexes.  Skin: Skin is warm and dry.  Psychiatric: She has a normal mood and affect. Her behavior is normal.  She did not talk much at all.  Alert but does not want to communicate.    Assessment/Plan: Comminuted impacted intra-articular fracture of the left distal radius.  Family will let me know if they would like to see a hand surgeon for possible surgery.  To maintain sugar tong splint for now.   Maikol Grassia 02/25/2013, 11:32 AM

## 2013-02-25 NOTE — Clinical Social Work Placement (Signed)
Clinical Social Work Department CLINICAL SOCIAL WORK PLACEMENT NOTE 02/25/2013  Patient:  Tamara [name],Tamara [name]  Account Number:  [id number] Admit date:  02/24/2013  Clinical Social Worker:  Derenda FennelKARA Cicilia Clinger, LCSW  Date/time:  02/25/2013 12:45 PM  Clinical Social Work is seeking post-discharge placement for this patient at the following level of care:   SKILLED NURSING   (*CSW will update this form in Epic as items are completed)   02/25/2013  Patient/family provided with [facility] Department of Clinical Social Work'[name] list of facilities offering this level of care within the geographic area requested by the patient (or if unable, by the patient'[name]).  02/25/2013  Patient/family informed of their freedom to choose among providers that offer the needed level of care, that participate in Medicare, Medicaid or managed care program needed by the patient, have an available bed and are willing to accept the patient.  02/25/2013  Patient/family informed of [facility]' ownership interest in [facility], as well as of the fact that they are under no obligation to receive care at this facility.  PASARR submitted to EDS on  PASARR number received from EDS on   FL2 transmitted to all facilities in geographic area requested by pt/family on  02/25/2013 FL2 transmitted to all facilities within larger geographic area on   Patient informed that his/her managed care company has contracts with or will negotiate with  certain facilities, including the following:     Patient/family informed of bed offers received:   Patient chooses bed at  Physician recommends and patient chooses bed at    Patient to be transferred to  on   Patient to be transferred to facility by   The following physician request were entered in Epic:   Additional Comments: Pt has existing pasarr. [employer].  Derenda FennelKara Odette Watanabe, Kentucky[place]CS[place] [phone]

## 2013-02-25 NOTE — Clinical Social Work Psychosocial (Signed)
Clinical Social Work Department BRIEF PSYCHOSOCIAL ASSESSMENT 02/25/2013  Patient:  Tamara [name], Tamara [name]     Account Number:  [id number]     Admit date:  02/24/2013  Clinical Social Worker:  Wyatt Haste  Date/Time:  02/25/2013 11:25 AM  Referred by:  CSW  Date Referred:  02/25/2013 Referred for  ALF Placement   Other Referral:   Interview type:  Patient Other interview type:   daughter-in-law Webb Silversmith    PSYCHOSOCIAL DATA Living Status:  FACILITY Admitted from facility:  [place] Level of care:  Assisted Living Primary support name:  Makailyn Mccormick Primary support relationship to patient:  CHILD, ADULT Degree of support available:   supportive    CURRENT CONCERNS Current Concerns  Post-Acute Placement   Other Concerns:    SOCIAL WORK ASSESSMENT / PLAN CSW met with pt and pt's daughter-in-law Lelon Frohlich at bedside. Pt sleepy during assessment and most of information came from [place]. She reports pt has been a resident at [facility] for about 10 months. Pt really enjoys being there and does well. Pt fell on Friday and Saturday. She was brought to ED and found to have fractured left wrist. Lelon Frohlich was frustrated because facility was unable to get medication in for pain until yesterday. She also felt that staff was not being overly attentive to needs yesterday. Other than this issue, they have been very pleased with [facility]. Per Whitney from facility, pt requires limited assist with bathing, but is otherwise fairly independent. She ambulates using a cane or walker. Okay to return at d/c. Ann asked about therapy options. Discussed in house PT at [facility] and SNF. Pt has been to SNF in the past. She desires to return to [facility] if able. Awaiting PT eval and recommendations. [facility] aware pt tested positive for influenza.   Assessment/plan status:  Psychosocial Support/Ongoing Assessment of Needs Other assessment/ plan:   Information/referral to  community resources:   [employer]    PATIENT'S/FAMILY'S RESPONSE TO PLAN OF CARE: Pt woke up to tell CSW how much she loves [place] and wants to return. Will follow up with PT recommendations to determine appropriate level of care.       Benay Pike, [place]

## 2013-02-25 NOTE — Clinical Social Work Note (Signed)
CSW discussed PT eval with pt and daughter-in-law Dewayne Hatchnn. Both are aware SNF is recommended at d/c. Discussed [employer] authorization process. Will initiate. SNF list provided.   Derenda FennelKara Raenell Mensing, [place]CS[place] [phone]

## 2013-02-25 NOTE — Evaluation (Signed)
Clinical/Bedside Swallow Evaluation  Patient Details  Name: Tamara [name] Iyer MRN: [id number] Date of Birth: 04/25/1924  Today'[name] Date: 02/25/2013 Time: 1230-1250 SLP Time Calculation (min): 20 min  Past Medical History:  Past Medical History  Diagnosis Date  . Hypertension   . Atrial fibrillation     Onset well before 2004; Negative stress nuclear study in 01/2003  . Osteoporosis   . Acute colitis 09/2011    C. difficile negative  . Chronic anticoagulation   . Hyperlipidemia   . Hiatal hernia     by CT in 2013; also noted was an adrenal adenoma, duodenal lipoma right ovarian cyst, post hysterectomy  . Pneumonia 09/2011    09/2011   Past Surgical History:  Past Surgical History  Procedure Laterality Date  . Abdominal hysterectomy      No oophorectomy  . Appendectomy    . Colonoscopy  2005    Reportedly normal screening study   HPI:  Tamara [name] Schellhorn is a 80 y.o. female with a past medical history dementia, atrial fibrillation on Coumadin, hypothyroidism, hypertension, who lives in [facility] and has been living there since March of 2014. She presented yesterday after a fall over at the facility. She was found to have a fracture of her left wrist. A splint was placed and she was asked to follow up with an orthopedic surgeon. She is accompanied by her daughter-in-law today who went to visit her and she found that she was warm to touch. Patient was found to have a fever with a temperature of 102F.   Assessment / Plan / Recommendation Clinical Impression  No overt signs or symptoms of aspiration noted at bedside with consistencies presented (pt consuming lunch meal). Pt had mild difficulty masticating meats and complained that they were "too tough". Pt with diminished appetite and needed encouragement to eat.     Aspiration Risk  None    Diet Recommendation Dysphagia 3 (Mechanical Soft);Thin liquid   Liquid Administration via: Cup;Straw Medication Administration: Whole  meds with liquid Supervision: Patient able to self feed;Intermittent supervision to cue for compensatory strategies Postural Changes and/or Swallow Maneuvers: Seated upright 90 degrees;Upright 30-60 min after meal    Other  Recommendations Oral Care Recommendations: Oral care BID   Follow Up Recommendations  24 hour supervision/assistance    Frequency and Duration min 2x/week  1 week        Swallow Study Prior Functional Status   [facility]    General Date of Onset: 02/23/13 HPI: Tamara [name] Dolinsky is a 80 y.o. female with a past medical history dementia, atrial fibrillation on Coumadin, hypothyroidism, hypertension, who lives in [facility] and has been living there since March of 2014. She presented yesterday after a fall over at the facility. She was found to have a fracture of her left wrist. A splint was placed and she was asked to follow up with an orthopedic surgeon. She is accompanied by her daughter-in-law today who went to visit her and she found that she was warm to touch. Patient was found to have a fever with a temperature of 102F. Type of Study: Bedside swallow evaluation Previous Swallow Assessment: N/A Diet Prior to this Study: Dysphagia 3 (soft);Thin liquids Temperature Spikes Noted: Yes History of Recent Intubation: No Behavior/Cognition: Alert;Cooperative;Pleasant mood Oral Cavity - Dentition: Dentures, top;Dentures, bottom Self-Feeding Abilities: Able to feed self Patient Positioning: Upright in chair Baseline Vocal Quality: Clear Volitional Cough: Strong    Oral/Motor/Sensory Function Overall Oral Motor/Sensory Function: Appears  within functional limits for tasks assessed   Ice Chips Ice chips: Not tested (Pt observed/assessed with lunch meal)   Thin Liquid Thin Liquid: Within functional limits Presentation: Cup;Self Fed;Straw    Nectar Thick Nectar Thick Liquid: Not tested   Honey Thick Honey Thick Liquid: Not tested   Puree Puree:  Within functional limits Presentation: Spoon;Self Fed   Solid   GO    Solid: Impaired Presentation: Self Fed Oral Phase Impairments: Reduced lingual movement/coordination Oral Phase Functional Implications: Oral residue Other Comments: Pt complains that meat is too tough (mech soft)      Thank you,  Havery Moros, CCC-SLP [phone]  PORTER,DABNEY 02/25/2013,3:09 PM

## 2013-02-25 NOTE — Progress Notes (Signed)
Patient up to chair by physical therapy. Tolerating well. Patient stated pain in improved when up in chair. Patients oxygen saturation continues to maintain between 94-96% on room air.

## 2013-02-25 NOTE — Progress Notes (Signed)
  I have directly reviewed the clinical findings, lab, imaging studies and management of this patient in detail. I have interviewed and examined the patient and agree with the documentation,  as recorded by the Physician extender.  Leroy SeaSINGH,PRASHANT K M.D on 02/25/2013 at 12:21 PM  [facility] Office  [phone]

## 2013-02-25 NOTE — Clinical Social Work Placement (Signed)
Clinical Social Work Department CLINICAL SOCIAL WORK PLACEMENT NOTE 02/25/2013  Patient:  Tamara [name],Tamara [name]  Account Number:  [id number] Admit date:  02/24/2013  Clinical Social Worker:  Derenda FennelKARA Avner Stroder, LCSW  Date/time:  02/25/2013 12:45 PM  Clinical Social Work is seeking post-discharge placement for this patient at the following level of care:   SKILLED NURSING   (*CSW will update this form in Epic as items are completed)   02/25/2013  Patient/family provided with [facility] of Clinical Social Work'[name] list of facilities offering this level of care within the geographic area requested by the patient (or if unable, by the patient'[name]).  02/25/2013  Patient/family informed of their freedom to choose among providers that offer the needed level of care, that participate in Medicare, Medicaid or managed care program needed by the patient, have an available bed and are willing to accept the patient.  02/25/2013  Patient/family informed of [facility]' ownership interest in [facility], as well as of the fact that they are under no obligation to receive care at this facility.  PASARR submitted to EDS on  PASARR number received from EDS on   FL2 transmitted to all facilities in geographic area requested by pt/family on  02/25/2013 FL2 transmitted to all facilities within larger geographic area on   Patient informed that his/her managed care company has contracts with or will negotiate with  certain facilities, including the following:     Patient/family informed of bed offers received:  02/25/2013 [facility] Physician [facility] and [facility]  Patient to be transferred to  on   Patient to be transferred to facility by   The following physician request were entered in Epic:   Additional Comments: Pt has existing pasarr. [employer].  Derenda FennelKara Babak [place], [place]CS[place] [phone]

## 2013-02-25 NOTE — Progress Notes (Signed)
[facility] PROGRESS NOTE  Tamara [name] OZH:[id number] DOB: 1924-12-27 DOA: 02/24/2013 PCP: Catalina Pizza, MD  Assessment/Plan: Assessment: This is 80 year old, Caucasian female, who presents with fever, cough. Influenza PCR is positive. So, this is all likely due to influenza. She could have a component of bronchitis as well. She has had a few falls recently with a fall yesterday resulting in left wrist fracture. She also has back pain with a CT that was unremarkable for any acute process.   Plan:  #1 fever secondary to influenza: Max temp 98.3. Continue Tamiflu. Due to possible concomitant acute bronchitis will continue her nebulizer treatments, as well as Levaquin day #1.    #2 recent fall with left wrist fracture: cast and sling in place. Pain managed.  Await orthopedic surgeon consult. PT and OT will be consulted as well. She is from [facility] according to admit note.  #3 back pain as a result of a fall: CT of the lumbar spine did not show any acute process. Remote L1 superior end plate fracture was seen. She does not have any neurological deficits at this time. Pain control and physical therapy. She does have a history of osteoporosis, which is contributing. She should continue her bisphosphonates, when she is discharged.   #4 history of dementia: appears to be at baseline. Continue with Namenda.   #5 atrial fibrillation on chronic anticoagulation: rate controlled.  Continue diltiazem. Continue with warfarin per pharmacy. INR was  subtherapeutic on admission. Anticipate some drug interaction.   #6 dehydration: BMET within the limits of normal but appears somewhat dry. Will continue very gentle IV hydration. EF back in March of 2014 was normal. However, she still could have diastolic dysfunction.    Hypokalemia: Resolved this am.   Hematuria: microscopic seen in the UA can be addressed by her PCP.    Code Status: DNR Family Communication: none present Disposition Plan:  back to facility when ready hopefully tomorrow   Consultants:  Dr. Mayra Reel  Pharmacy for coumadin  Procedures:  none  Antibiotics:  Levaquin 02/25/13>>>  HPI/Subjective: Lying in bed alert. Reports pain left wrist and back  Objective: Filed Vitals:   02/25/13 0518  BP: 166/82  Pulse: 89  Temp: 98.1 F (36.7 C)  Resp: 18    Intake/Output Summary (Last 24 hours) at 02/25/13 0925 Last data filed at 02/25/13 0600  Gross per 24 hour  Intake 1616.25 ml  Output      8 ml  Net 1608.25 ml   Filed Weights   02/24/13 2203  Weight: 71.2 kg (156 lb 15.5 oz)    Exam:   General:  Well nourished somewhat anxious appearing NAD  Cardiovascular: irregularly irregular no MGR No LE edema  Respiratory: normal effort. BS somewhat diminished in bases otherwise mild diffuse rhonchi. No wheeze or crackles  Abdomen: soft +BS non-tender to palpation   Musculoskeletal: cast and sling to left arm. Fingers without swelling or erythemal. Fingers warm to touch with sensation and motion intact. Other joints without swelling/erythema   Data Reviewed: Basic Metabolic Panel:  Recent Labs Lab 02/24/13 1835 02/25/13 0453  NA 138 137  K 3.5* 4.0  CL 100 102  CO2 25 24  GLUCOSE 117* 99  BUN 11 10  CREATININE 0.71 0.63  CALCIUM 9.3 8.7   Liver Function Tests:  Recent Labs Lab 02/24/13 1835 02/25/13 0453  AST 22 24  ALT 13 11  ALKPHOS 93 79  BILITOT 0.5 0.5  PROT 6.1 5.9*  ALBUMIN 3.3*  2.9*   No results found for this basename: LIPASE, AMYLASE,  in the last 168 hours No results found for this basename: AMMONIA,  in the last 168 hours CBC:  Recent Labs Lab 02/24/13 1835 02/25/13 0453  WBC 6.6 6.1  NEUTROABS 5.2  --   HGB 14.1 13.4  HCT 41.7 41.1  MCV 89.3 91.1  PLT 194 177   Cardiac Enzymes: No results found for this basename: CKTOTAL, CKMB, CKMBINDEX, TROPONINI,  in the last 168 hours BNP (last 3 results)  Recent Labs  02/07/13 1546  PROBNP 908.8*    CBG: No results found for this basename: GLUCAP,  in the last 168 hours  No results found for this or any previous visit (from the past 240 hour(s)).   Studies: Dg Chest 1 View  02/24/2013   CLINICAL DATA:  Fever, cough, congestion today  EXAM: CHEST - 1 VIEW  COMPARISON:  12/18/4 T  FINDINGS: Mild to moderate cardiac enlargement stable. Vascular pattern normal. Lungs clear except for mild subsegmental atelectasis in the lateral left lower lobe similar to the prior study.  IMPRESSION: No active disease.   Electronically Signed   By: Esperanza Heir M.D.   On: 02/24/2013 19:16   Dg Lumbar Spine Complete  02/23/2013   CLINICAL DATA:  Fall with back pain.  EXAM: LUMBAR SPINE - COMPLETE 4+ VIEW  COMPARISON:  KUB 09/27/2011 and CT abdomen/ pelvis 09/28/2011.  FINDINGS: Exam demonstrates severe spondylosis of the lumbar spine. There is mild decreased bone density. There is mild compression deformity of L1 unchanged. There is disc space narrowing at multiple levels unchanged. There subtle anterior wedging of the T12 not well seen on the prior exams. There is possible mild loss of posterior vertebral body height of L5 not well seen on the prior exams. Facet arthropathy is present. There is calcified plaque over the abdominal aorta.  IMPRESSION: Severe spondylosis of the lumbar spinal multilevel disc disease. Stable L1 compression fracture. Mild anterior wedging of T12 a mild posterior wedging of L5 not well seen on the prior exams as this is likely chronic although cannot exclude acute injury.   Electronically Signed   By: Elberta Fortis M.D.   On: 02/23/2013 21:44   Dg Wrist Complete Left  02/23/2013   CLINICAL DATA:  Fall.  Wrist injury and pain.  EXAM: LEFT WRIST - COMPLETE 3+ VIEW  COMPARISON:  None.  FINDINGS: Comminuted fracture of the distal radius is seen with intra-articular extension into the radiocarpal joint. There is mild impaction and dorsal angulation of the distal articular surface of the  radius.  Nondisplaced ulnar styloid fracture also noted. No carpal bone fracture or dislocation identified. Osteoarthritis of the radiocarpal, STT joint complex, and base of thumb noted. Peripheral vascular calcification also demonstrated.  IMPRESSION: Comminuted fracture of the distal radius, with mild impaction and dorsal angulation.  Nondisplaced ulnar styloid fracture.  Wrist osteoarthritis.   Electronically Signed   By: Myles Rosenthal M.D.   On: 02/23/2013 20:07   Ct Head Wo Contrast  02/24/2013   CLINICAL DATA:  Fall 1 day ago with altered mental status and weakness.  EXAM: CT HEAD WITHOUT CONTRAST  TECHNIQUE: Contiguous axial images were obtained from the base of the skull through the vertex without intravenous contrast.  COMPARISON:  None.  FINDINGS: Sinuses/Soft tissues: No significant soft tissue swelling. Clear paranasal sinuses and mastoid air cells.  Intracranial: Advanced cerebral atrophy. Ventriculomegaly which is felt to be secondary. No mass lesion, hemorrhage, hydrocephalus, acute  infarct, intra-axial, or extra-axial fluid collection.  IMPRESSION: No acute or posttraumatic deformity identified. Normal head for age.   Electronically Signed   By: Jeronimo GreavesKyle  Talbot M.D.   On: 02/24/2013 20:45   Ct Lumbar Spine Wo Contrast  02/24/2013   CLINICAL DATA:  Fall with mid back pain. Abnormal lumbar spine radiography  EXAM: CT LUMBAR SPINE WITHOUT CONTRAST  TECHNIQUE: Multidetector CT imaging of the lumbar spine was performed without intravenous contrast administration. Multiplanar CT image reconstructions were also generated.  COMPARISON:  Abdominal CT 09/28/2011  FINDINGS: No evidence of acute fracture. There is stable mild compression deformity of the L1 vertebral body, with upper body sclerosis. Remote left L1 and L2 transverse process fractures, with callus. No perivertebral edema. There is diffuse spondylosis and degenerative facet change, without evidence of significant osseous canal or foraminal stenosis.   Incidental 2.5 cm adenoma in the right adrenal gland. There is a nonobstructive lipoma within the mid duodenum. A right adnexal/ovarian cyst is larger than before, now 5 x 4 cm as compared to 4 x 3 cm.  IMPRESSION: 1. No evidence of acute lumbar fracture. 2. Remote L1 superior endplate fracture. Remote L1 and L2 left transverse process fractures. 3. Right adnexal/ovarian cyst has enlarged 1 cm since 09/28/2011, now 5 cm.   Electronically Signed   By: Tiburcio PeaJonathan  [name] M.D.   On: 02/24/2013 00:01    Scheduled Meds: . diltiazem  120 mg Oral Daily  . docusate sodium  100 mg Oral BID  . escitalopram  10 mg Oral Daily  . levalbuterol  1.25 mg Nebulization Q6H  . levofloxacin (LEVAQUIN) IV  500 mg Intravenous Q24H  . levothyroxine  100 mcg Oral QAC breakfast  . memantine  10 mg Oral BID AC  . oseltamivir  75 mg Oral BID  . pantoprazole  40 mg Oral Daily  . sodium chloride  3 mL Intravenous Q12H   Continuous Infusions: . sodium chloride 1 mL (02/25/13 0839)    Principal Problem:   Influenza due to identified novel influenza A virus with other respiratory manifestations Active Problems:   Atrial fibrillation   Hypertension   Chronic anticoagulation   Fever   Acute bronchitis   Back pain   Left wrist fracture   Hypokalemia   Hematuria    Time spent: 35 minutes    [facility] AlbanyBLACK,KAREN M  [facility] Pager [phone]. If 7PM-7AM, please contact night-coverage at [email or web address], password [email or web address] 02/25/2013, 9:25 AM  LOS: 1 day

## 2013-02-25 NOTE — Progress Notes (Signed)
ANTICOAGULATION CONSULT NOTE - Initial Consult  Pharmacy Consult for Coumadin (chronic Rx PTA) Indication: atrial fibrillation  No Known Allergies  Patient Measurements: Height: 5\' 10"  (177.8 cm) Weight: 156 lb 15.5 oz (71.2 kg) IBW/kg (Calculated) : 68.5  Vital Signs: Temp: 98.1 F (36.7 C) (01/05 0518) Temp src: Oral (01/05 0518) BP: 166/82 mmHg (01/05 0518) Pulse Rate: 62 (01/05 1100)  Labs:  Recent Labs  02/24/13 1835 02/25/13 0453  HGB 14.1 13.4  HCT 41.7 41.1  PLT 194 177  LABPROT 19.4*  --   INR 1.69*  --   CREATININE 0.71 0.63   Estimated Creatinine Clearance: 54.6 ml/min (by C-G formula based on Cr of 0.63).  Medical History: Past Medical History  Diagnosis Date  . Hypertension   . Atrial fibrillation     Onset well before 2004; Negative stress nuclear study in 01/2003  . Osteoporosis   . Acute colitis 09/2011    C. difficile negative  . Chronic anticoagulation   . Hyperlipidemia   . Hiatal hernia     by CT in 2013; also noted was an adrenal adenoma, duodenal lipoma right ovarian cyst, post hysterectomy  . Pneumonia 09/2011    09/2011   Medications:  Prescriptions prior to admission  Medication Sig Dispense Refill  . alendronate (FOSAMAX) 70 MG tablet Take 70 mg by mouth once a week. Take with a full glass of water on an empty stomach.(on Sunday)      . diltiazem (CARDIZEM) 120 MG tablet Take 120 mg by mouth daily.      Marland Kitchen. escitalopram (LEXAPRO) 10 MG tablet Take 1 tablet (10 mg total) by mouth daily.  30 tablet  3  . HYDROcodone-acetaminophen (NORCO/VICODIN) 5-325 MG per tablet Take 1 tablet by mouth every 6 (six) hours as needed for moderate pain.  20 tablet  0  . levothyroxine (SYNTHROID, LEVOTHROID) 100 MCG tablet Take 100 mcg by mouth daily before breakfast.      . memantine (NAMENDA) 10 MG tablet Take 10 mg by mouth 2 (two) times daily. Administered at 8am and 5pm      . omeprazole (PRILOSEC) 40 MG capsule Take 1 capsule (40 mg total) by mouth  daily. For heartburn  30 capsule  3  . Polyethyl Glycol-Propyl Glycol (SYSTANE OP) Apply to eye 2 (two) times daily. 1 drop into both eyes      . potassium chloride (K-DUR) 10 MEQ tablet Take 10 mEq by mouth daily.       Marland Kitchen. warfarin (COUMADIN) 3 MG tablet Take 4.5 mg by mouth See admin instructions. Take one and one-half tablet (4.5mg  total) on Tuesdays, Thursdays, Saturdays, and Sundays ONLY      . diclofenac sodium (VOLTAREN) 1 % GEL Apply 4 g topically 3 (three) times daily as needed. pain      . warfarin (COUMADIN) 6 MG tablet Take 6 mg by mouth See admin instructions. Take one tablet (6mg  total) on Mondays Wednesdays and Fridays ONLY       Assessment: 80yo female on chronic Coumadin PTA for h/o afib.  INR is subtherapeutic on admission.  Home dose is listed above.   Goal of Therapy:  INR 2-3 Monitor platelets by anticoagulation protocol: Yes   Plan:  Coumadin 6mg  po today x 1 INR daily  Vu Liebman A 02/25/2013,11:25 AM

## 2013-02-25 NOTE — Progress Notes (Signed)
Utilization Review Complete  

## 2013-02-25 NOTE — Evaluation (Signed)
Physical Therapy Evaluation Patient Details Name: Tamara [name] MRN: [id number] DOB: 03-Dec-1924 Today's Date: 02/25/2013 Time: 8295-6213 PT Time Calculation (min): 58 min  PT Assessment / Plan / Recommendation History of Present Illness  Pt is a resident of [facility] who is admitted with influenza A virus and left wrist fx due to a fall.  She has had several falls recently "because my knees give way".  She has baseline dementia but is able to converse appropriately and follow all directions without difficutly.  Clinical Impression   Pt was seen for evaluation.  LUE is in a splint with sling.  She was instructed that she will have to be NWB on the left hand.  She is RUE dominant.  She was alert and oriented, very cooperative but exceedingly weak.  Weakness is generalized with OA of both knees.  Simply sitting at EOB was difficult for her and she slumped over most of the time.  It took 2 people to transfer her bed to chair.  She normally uses a cane for gait but because of profound weakness she will need a left platform walker.  She will need SNF at d/c.    PT Assessment  Patient needs continued PT services    Follow Up Recommendations  SNF    Does the patient have the potential to tolerate intense rehabilitation      Barriers to Discharge        Equipment Recommendations   (left platform walker)    Recommendations for Other Services     Frequency Min 3X/week    Precautions / Restrictions Precautions Precautions: Fall Required Braces or Orthoses: Sling Restrictions Weight Bearing Restrictions: Yes LUE Weight Bearing: Non weight bearing Other Position/Activity Restrictions: NWB on left wrist...will need a platform walker for gait   Pertinent Vitals/Pain       Mobility  Bed Mobility Bed Mobility: Supine to Sit Supine to Sit: 3: Mod assist Transfers Transfers: Sit to Stand;Stand to Sit;Stand Pivot Transfers Sit to Stand: 2: Max assist;With upper extremity assist;From  bed Stand to Sit: 2: Max assist;With upper extremity assist Stand Pivot Transfers: 2: Max assist Details for Transfer Assistance: it took 2 person assist to have pt transfer from bed to chair with pivot transfer...she was able to fully weight bear on LEs but she was unable to off load weight from either foot to pivot her feet Ambulation/Gait Ambulation/Gait Assistance: Not tested (comment) (unable)    Exercises     PT Diagnosis: Difficulty walking;Generalized weakness  PT Problem List: Decreased strength;Decreased activity tolerance;Decreased mobility;Decreased knowledge of use of DME PT Treatment Interventions: Gait training;Functional mobility training;Therapeutic exercise     PT Goals(Current goals can be found in the care plan section) Acute Rehab PT Goals Patient Stated Goal: return to [facility] PT Goal Formulation: With patient Time For Goal Achievement: 03/11/13 Potential to Achieve Goals: Good  Visit Information  Last PT Received On: 02/25/13 History of Present Illness: Pt is a resident of [facility] who is admitted with influenza A virus and left wrist fx due to a fall.  She has had several falls recently "because my knees give way".  She has baseline dementia but is able to converse appropriately and follow all directions without difficutly.       Prior Functioning  Home Living Family/patient expects to be discharged to:: Skilled nursing facility Prior Function Level of Independence: Independent with assistive device(s) Comments: does need minn assist with bathing...uses a cane for gait Communication Communication: No difficulties  Cognition  Cognition Arousal/Alertness: Awake/alert Behavior During Therapy: WFL for tasks assessed/performed Overall Cognitive Status: History of cognitive impairments - at baseline    Extremity/Trunk Assessment Lower Extremity Assessment Lower Extremity Assessment: Generalized weakness   Balance Balance Balance Assessed: No  End of Session  PT - End of Session Equipment Utilized During Treatment: Gait belt Activity Tolerance: Patient limited by fatigue Patient left: in chair;with call bell/phone within reach;with chair alarm set Nurse Communication: Mobility status  GP     Konrad PentaBrown, Zehra Rucci L 02/25/2013, 12:10 PM

## 2013-02-26 ENCOUNTER — Other Ambulatory Visit: Payer: Self-pay | Admitting: *Deleted

## 2013-02-26 ENCOUNTER — Inpatient Hospital Stay
Admission: RE | Admit: 2013-02-26 | Discharge: 2013-03-13 | Disposition: A | Discharge status: Home / Self Care | Payer: Medicare PPO | Source: Ambulatory Visit | Attending: Internal Medicine | Admitting: Internal Medicine

## 2013-02-26 DIAGNOSIS — R05 Cough: Secondary | ICD-10-CM

## 2013-02-26 DIAGNOSIS — R0989 Other specified symptoms and signs involving the circulatory and respiratory systems: Secondary | ICD-10-CM

## 2013-02-26 DIAGNOSIS — R059 Cough, unspecified: Secondary | ICD-10-CM

## 2013-02-26 DIAGNOSIS — M549 Dorsalgia, unspecified: Secondary | ICD-10-CM

## 2013-02-26 DIAGNOSIS — S62102A Fracture of unspecified carpal bone, left wrist, initial encounter for closed fracture: Principal | ICD-10-CM

## 2013-02-26 LAB — PROTIME-INR
INR: 1.9 — ABNORMAL HIGH (ref 0.00–1.49)
PROTHROMBIN TIME: 21.2 s — AB (ref 11.6–15.2)

## 2013-02-26 MED ORDER — HYDROCODONE-ACETAMINOPHEN 5-325 MG PO TABS: 1.0000 | ORAL_TABLET | Freq: Four times a day (QID) | ORAL | Status: DC | PRN

## 2013-02-26 MED ORDER — LEVALBUTEROL HCL 0.63 MG/3ML IN NEBU: 0.6300 mg | INHALATION_SOLUTION | Freq: Four times a day (QID) | RESPIRATORY_TRACT | Status: DC

## 2013-02-26 MED ORDER — OSELTAMIVIR PHOSPHATE 75 MG PO CAPS: 75.0000 mg | ORAL_CAPSULE | Freq: Two times a day (BID) | ORAL | Status: DC

## 2013-02-26 MED ORDER — ACETAMINOPHEN 325 MG PO TABS: 650.0000 mg | ORAL_TABLET | Freq: Four times a day (QID) | ORAL | Status: DC | PRN

## 2013-02-26 MED ORDER — WARFARIN SODIUM 5 MG PO TABS: 5.0000 mg | ORAL_TABLET | Freq: Once | ORAL | Status: DC

## 2013-02-26 MED ORDER — DSS 100 MG PO CAPS: 100.0000 mg | ORAL_CAPSULE | Freq: Two times a day (BID) | ORAL | Status: AC

## 2013-02-26 NOTE — Clinical Social Work Placement (Signed)
Clinical Social Work Department CLINICAL SOCIAL WORK PLACEMENT NOTE 02/26/2013  Patient:  Tamara [name],Tamara [name]  Account Number:  [id number] Admit date:  02/24/2013  Clinical Social Worker:  Derenda FennelKARA Lan [place]er, LCSW  Date/time:  02/25/2013 12:45 PM  Clinical Social Work is seeking post-discharge placement for this patient at the following level of care:   SKILLED NURSING   (*CSW will update this form in Epic as items are completed)   02/25/2013  Patient/family provided with [facility] Clinical Social Work'[name] list of facilities offering this level of care within the geographic area requested by the patient (or if unable, by the patient'[name]).  02/25/2013  Patient/family informed of their freedom to choose among providers that offer the needed level of care, that participate in Medicare, Medicaid or managed care program needed by the patient, have an available bed and are willing to accept the patient.  02/25/2013  Patient/family informed of [facility] ownership interest in [facility], as well as of the fact that they are under no obligation to receive care at this facility.  PASARR submitted to EDS on  PASARR number received from EDS on   FL2 transmitted to all facilities in geographic area requested by pt/family on  02/25/2013 FL2 transmitted to all facilities within larger geographic area on   Patient informed that his/her managed care company has contracts with or will negotiate with  certain facilities, including the following:     Patient/family informed of bed offers received:  02/25/2013 [facility] Physician [facility] and [facility]  [facility] on  02/26/2013 Patient to be transferred to facility by RN  The following physician request were entered in Epic:   Additional Comments: Pt has existing pasarr. [employer].  Derenda FennelKara Eulala [place], [place]CS[place] [phone]

## 2013-02-26 NOTE — Progress Notes (Signed)
Subjective: My wrist is hurting some   Objective: Vital signs in last 24 hours: Temp:  [97.5 F (36.4 C)-98.3 F (36.8 C)] 97.5 F (36.4 C) (01/06 0559) Pulse Rate:  [62-85] 85 (01/06 0559) Resp:  [18] 18 (01/06 0559) BP: (95-147)/(58-78) 147/78 mmHg (01/06 0559) SpO2:  [92 %-98 %] 94 % (01/06 0742)  Intake/Output from previous day: 01/05 0701 - 01/06 0700 In: 480 [P.O.:480] Out: 200 [Urine:200] Intake/Output this shift:     Recent Labs  02/24/13 1835 02/25/13 0453  HGB 14.1 13.4    Recent Labs  02/24/13 1835 02/25/13 0453  WBC 6.6 6.1  RBC 4.67 4.51  HCT 41.7 41.1  PLT 194 177    Recent Labs  02/24/13 1835 02/25/13 0453  NA 138 137  K 3.5* 4.0  CL 100 102  CO2 25 24  BUN 11 10  CREATININE 0.71 0.63  GLUCOSE 117* 99  CALCIUM 9.3 8.7    Recent Labs  02/24/13 1835 02/26/13 0457  INR 1.69* 1.90*    Neurovascular intact Sensation intact distally Intact pulses distally  She is talking to me today.  She knows she is in hospital and that she hurt her wrist.  She wants to go home.  Assessment/Plan: I await family to let me know if they want to consider surgery or leave wrist as it is in splint and accept some deformity.   Axzel Rockhill 02/26/2013, 8:04 AM

## 2013-02-26 NOTE — Clinical Social Work Note (Signed)
[facility] [employer] [facility] received. Pt ready for d/c today. Pt and daughter-in-law notified and agreeable. Pt to transfer with RN. D/C summary faxed. Out of facility DNR in packet.   Derenda FennelKara Nancee Brownrigg, [place] [phone]

## 2013-02-26 NOTE — Progress Notes (Signed)
ANTICOAGULATION CONSULT NOTE  Pharmacy Consult for Coumadin (chronic Rx PTA) Indication: atrial fibrillation  No Known Allergies  Patient Measurements: Height: 5\' 10"  (177.8 cm) Weight: 156 lb 15.5 oz (71.2 kg) IBW/kg (Calculated) : 68.5  Vital Signs: Temp: 97.5 F (36.4 C) (01/06 0559) Temp src: Oral (01/06 0559) BP: 147/78 mmHg (01/06 0559) Pulse Rate: 85 (01/06 0559)  Labs:  Recent Labs  02/24/13 1835 02/25/13 0453 02/26/13 0457  HGB 14.1 13.4  --   HCT 41.7 41.1  --   PLT 194 177  --   LABPROT 19.4*  --  21.2*  INR 1.69*  --  1.90*  CREATININE 0.71 0.63  --    Estimated Creatinine Clearance: 54.6 ml/min (by C-G formula based on Cr of 0.63).  Medical History: Past Medical History  Diagnosis Date  . Hypertension   . Atrial fibrillation     Onset well before 2004; Negative stress nuclear study in 01/2003  . Osteoporosis   . Acute colitis 09/2011    C. difficile negative  . Chronic anticoagulation   . Hyperlipidemia   . Hiatal hernia     by CT in 2013; also noted was an adrenal adenoma, duodenal lipoma right ovarian cyst, post hysterectomy  . Pneumonia 09/2011    09/2011   Medications:  Prescriptions prior to admission  Medication Sig Dispense Refill  . alendronate (FOSAMAX) 70 MG tablet Take 70 mg by mouth once a week. Take with a full glass of water on an empty stomach.(on Sunday)      . diltiazem (CARDIZEM) 120 MG tablet Take 120 mg by mouth daily.      Marland Kitchen. escitalopram (LEXAPRO) 10 MG tablet Take 1 tablet (10 mg total) by mouth daily.  30 tablet  3  . HYDROcodone-acetaminophen (NORCO/VICODIN) 5-325 MG per tablet Take 1 tablet by mouth every 6 (six) hours as needed for moderate pain.  20 tablet  0  . levothyroxine (SYNTHROID, LEVOTHROID) 100 MCG tablet Take 100 mcg by mouth daily before breakfast.      . memantine (NAMENDA) 10 MG tablet Take 10 mg by mouth 2 (two) times daily. Administered at 8am and 5pm      . omeprazole (PRILOSEC) 40 MG capsule Take 1  capsule (40 mg total) by mouth daily. For heartburn  30 capsule  3  . Polyethyl Glycol-Propyl Glycol (SYSTANE OP) Apply to eye 2 (two) times daily. 1 drop into both eyes      . potassium chloride (K-DUR) 10 MEQ tablet Take 10 mEq by mouth daily.       Marland Kitchen. warfarin (COUMADIN) 3 MG tablet Take 4.5 mg by mouth See admin instructions. Take one and one-half tablet (4.5mg  total) on Tuesdays, Thursdays, Saturdays, and Sundays ONLY      . diclofenac sodium (VOLTAREN) 1 % GEL Apply 4 g topically 3 (three) times daily as needed. pain      . warfarin (COUMADIN) 6 MG tablet Take 6 mg by mouth See admin instructions. Take one tablet (6mg  total) on Mondays Wednesdays and Fridays ONLY       Assessment: 80yo female on chronic Coumadin PTA for h/o afib.  INR remains subtherapeutic.  Home dose is listed above.    Goal of Therapy:  INR 2-3   Plan:  Coumadin 5mg  po today x 1 INR daily  Mady GemmaHayes, Aara Jacquot R 02/26/2013,9:08 AM

## 2013-02-26 NOTE — Discharge Summary (Signed)
Physician Discharge Summary  Tamara [name] ZOX:[id number] DOB: 07/14/1924 DOA: 02/24/2013  PCP: Catalina Pizza, MD  Admit date: 02/24/2013 Discharge date: 02/26/2013  Time spent: 45 minutes  Recommendations for Outpatient Follow-up:  1. INR check on 1/7 adjust coumadin as appropriate. (afib) 2. Patient will need Ortho follow up in 2 - 3 days (this week) to determine whether or not her wrist will be surgically repaired. 3.   D/C to SNF for physical therapy rehab and care. 4.   Patient positive for influenza needs tamiflu and supportive respiratory care. 5.  Outpatient follow up for hematuria.   Discharge Diagnoses:  Principal Problem:   Influenza due to identified novel influenza A virus with other respiratory manifestations Active Problems:   Atrial fibrillation   Hypertension   Chronic anticoagulation   Fever   Acute bronchitis   Back pain   Left wrist fracture   Hypokalemia   Hematuria   Discharge Condition: stable with fractured left wrist.  Diet recommendation: heart healthy, dysphagia 3 - soft diet.  Filed Weights   02/24/13 2203  Weight: 71.2 kg (156 lb 15.5 oz)    History of present illness at the time of admission:  Tamara [name] is a 80 y.o. female with a past medical history dementia, atrial fibrillation on Coumadin, hypothyroidism, hypertension, who lives in [facility] and has been living there since March of 2014. She presented yesterday after a fall over at the facility. She was found to have a fracture of her left wrist. A splint was placed and she was asked to follow up with an orthopedic surgeon. She is accompanied by her daughter-in-law today who went to visit her and she found that she was warm to touch. Patient was found to have a fever with a temperature of 102F. She was given a dose of Vicodin at 5 PM and the temperature decreased to 101.35F. They decided to send her to the hospital subsequently. She has been having a cough since this morning with  the whitish expectoration.    Hospital Course:   Influenza PCR positive.   Now afebrile. Has been on Tamiflu since the evening of 1/4. Would still benefit from supportive care with mucinex and nebulizers.  Wrist Fracture Dr. Hilda Lias consulted.  Family is to decide whether or not they desire surgery Fingers are slightly swollen, but hand is warm and sensation is intact. Patient treated supportively with stabilization splint and pain medications.  Back Pain secondary to recurrent falls. CT lumbar spine was negative for acute changes. Pain management, supportive care, physical therapy at SNF.  Dementia.  At baseline.  On Namenda.  A-fib on chronic anticoagulation INR is 1.9 today.   Recommend INR daily until coumadin is therapeutic. Rate controlled. Continue Cardizem.   Consultations:  Ortho, Dr. Hilda Lias.  Discharge Exam: Filed Vitals:   02/26/13 0559  BP: 147/78  Pulse: 85  Temp: 97.5 F (36.4 C)  Resp: 18    General: elderly, frail, lying comfortably in bed.  Non toxic,  Non productive cough Cardiovascular: rrr, no m/r/g, no lower ext edema Respiratory: bilateral rales, non productive cough. Abdomen:  Soft, nt, protuberant, well healed scar, +bs. Extremities:  Able to move all 4.  Right for arm in splint.  Discharge Instructions      Discharge Orders   Future Orders Complete By Expires   Diet - low sodium heart healthy  As directed    Increase activity slowly  As directed        Medication  List         acetaminophen 325 MG tablet  Commonly known as:  TYLENOL  Take 2 tablets (650 mg total) by mouth every 6 (six) hours as needed for mild pain (or Fever >/= 101).     alendronate 70 MG tablet  Commonly known as:  FOSAMAX  Take 70 mg by mouth once a week. Take with a full glass of water on an empty stomach.(on Sunday)     diclofenac sodium 1 % Gel  Commonly known as:  VOLTAREN  Apply 4 g topically 3 (three) times daily as needed. pain     diltiazem  120 MG tablet  Commonly known as:  CARDIZEM  Take 120 mg by mouth daily.     DSS 100 MG Caps  Take 100 mg by mouth 2 (two) times daily.     escitalopram 10 MG tablet  Commonly known as:  LEXAPRO  Take 1 tablet (10 mg total) by mouth daily.     HYDROcodone-acetaminophen 5-325 MG per tablet  Commonly known as:  NORCO/VICODIN  Take 1 tablet by mouth every 6 (six) hours as needed for moderate pain.     levalbuterol 0.63 MG/3ML nebulizer solution  Commonly known as:  XOPENEX  Take 3 mLs (0.63 mg total) by nebulization every 6 (six) hours.     levothyroxine 100 MCG tablet  Commonly known as:  SYNTHROID, LEVOTHROID  Take 100 mcg by mouth daily before breakfast.     memantine 10 MG tablet  Commonly known as:  NAMENDA  Take 10 mg by mouth 2 (two) times daily. Administered at 8am and 5pm     omeprazole 40 MG capsule  Commonly known as:  PRILOSEC  Take 1 capsule (40 mg total) by mouth daily. For heartburn     oseltamivir 75 MG capsule  Commonly known as:  TAMIFLU  Take 1 capsule (75 mg total) by mouth 2 (two) times daily.     potassium chloride 10 MEQ tablet  Commonly known as:  K-DUR  Take 10 mEq by mouth daily.     SYSTANE OP  Apply to eye 2 (two) times daily. 1 drop into both eyes     warfarin 3 MG tablet  Commonly known as:  COUMADIN  Take 4.5 mg by mouth See admin instructions. Take one and one-half tablet (4.5mg  total) on Tuesdays, Thursdays, Saturdays, and Sundays ONLY       No Known Allergies    The results of significant diagnostics from this hospitalization (including imaging, microbiology, ancillary and laboratory) are listed below for reference.    Significant Diagnostic Studies: Dg Chest 1 View  02/24/2013   CLINICAL DATA:  Fever, cough, congestion today  EXAM: CHEST - 1 VIEW  COMPARISON:  12/18/4 T  FINDINGS: Mild to moderate cardiac enlargement stable. Vascular pattern normal. Lungs clear except for mild subsegmental atelectasis in the lateral left lower  lobe similar to the prior study.  IMPRESSION: No active disease.   Electronically Signed   By: Esperanza Heir M.D.   On: 02/24/2013 19:16    Dg Lumbar Spine Complete  02/23/2013   CLINICAL DATA:  Fall with back pain.  EXAM: LUMBAR SPINE - COMPLETE 4+ VIEW  COMPARISON:  KUB 09/27/2011 and CT abdomen/ pelvis 09/28/2011.  FINDINGS: Exam demonstrates severe spondylosis of the lumbar spine. There is mild decreased bone density. There is mild compression deformity of L1 unchanged. There is disc space narrowing at multiple levels unchanged. There subtle anterior wedging of the T12 not  well seen on the prior exams. There is possible mild loss of posterior vertebral body height of L5 not well seen on the prior exams. Facet arthropathy is present. There is calcified plaque over the abdominal aorta.  IMPRESSION: Severe spondylosis of the lumbar spinal multilevel disc disease. Stable L1 compression fracture. Mild anterior wedging of T12 a mild posterior wedging of L5 not well seen on the prior exams as this is likely chronic although cannot exclude acute injury.   Electronically Signed   By: Elberta Fortis M.D.   On: 02/23/2013 21:44   Dg Wrist Complete Left  02/23/2013   CLINICAL DATA:  Fall.  Wrist injury and pain.  EXAM: LEFT WRIST - COMPLETE 3+ VIEW  COMPARISON:  None.  FINDINGS: Comminuted fracture of the distal radius is seen with intra-articular extension into the radiocarpal joint. There is mild impaction and dorsal angulation of the distal articular surface of the radius.  Nondisplaced ulnar styloid fracture also noted. No carpal bone fracture or dislocation identified. Osteoarthritis of the radiocarpal, STT joint complex, and base of thumb noted. Peripheral vascular calcification also demonstrated.  IMPRESSION: Comminuted fracture of the distal radius, with mild impaction and dorsal angulation.  Nondisplaced ulnar styloid fracture.  Wrist osteoarthritis.   Electronically Signed   By: Myles Rosenthal M.D.   On:  02/23/2013 20:07   Ct Head Wo Contrast  02/24/2013   CLINICAL DATA:  Fall 1 day ago with altered mental status and weakness.  EXAM: CT HEAD WITHOUT CONTRAST  TECHNIQUE: Contiguous axial images were obtained from the base of the skull through the vertex without intravenous contrast.  COMPARISON:  None.  FINDINGS: Sinuses/Soft tissues: No significant soft tissue swelling. Clear paranasal sinuses and mastoid air cells.  Intracranial: Advanced cerebral atrophy. Ventriculomegaly which is felt to be secondary. No mass lesion, hemorrhage, hydrocephalus, acute infarct, intra-axial, or extra-axial fluid collection.  IMPRESSION: No acute or posttraumatic deformity identified. Normal head for age.   Electronically Signed   By: Jeronimo Greaves M.D.   On: 02/24/2013 20:45   Ct Lumbar Spine Wo Contrast  02/24/2013   CLINICAL DATA:  Fall with mid back pain. Abnormal lumbar spine radiography  EXAM: CT LUMBAR SPINE WITHOUT CONTRAST  TECHNIQUE: Multidetector CT imaging of the lumbar spine was performed without intravenous contrast administration. Multiplanar CT image reconstructions were also generated.  COMPARISON:  Abdominal CT 09/28/2011  FINDINGS: No evidence of acute fracture. There is stable mild compression deformity of the L1 vertebral body, with upper body sclerosis. Remote left L1 and L2 transverse process fractures, with callus. No perivertebral edema. There is diffuse spondylosis and degenerative facet change, without evidence of significant osseous canal or foraminal stenosis.  Incidental 2.5 cm adenoma in the right adrenal gland. There is a nonobstructive lipoma within the mid duodenum. A right adnexal/ovarian cyst is larger than before, now 5 x 4 cm as compared to 4 x 3 cm.  IMPRESSION: 1. No evidence of acute lumbar fracture. 2. Remote L1 superior endplate fracture. Remote L1 and L2 left transverse process fractures. 3. Right adnexal/ovarian cyst has enlarged 1 cm since 09/28/2011, now 5 cm.   Electronically Signed    By: Tiburcio Pea M.D.   On: 02/24/2013 00:01    Microbiology: Recent Results (from the past 240 hour(s))  CULTURE, BLOOD (ROUTINE X 2)     Status: None   Collection Time    02/24/13  6:34 PM      Result Value Range Status   Specimen Description BLOOD RIGHT  HAND   Final   Special Requests BOTTLES DRAWN AEROBIC AND ANAEROBIC 8CC EACH   Final   Culture NO GROWTH 2 DAYS   Final   Report Status PENDING   Incomplete  CULTURE, BLOOD (ROUTINE X 2)     Status: None   Collection Time    02/24/13  6:45 PM      Result Value Range Status   Specimen Description BLOOD RIGHT ARM   Final   Special Requests BOTTLES DRAWN AEROBIC ONLY 6CC   Final   Culture NO GROWTH 2 DAYS   Final   Report Status PENDING   Incomplete     Labs: Basic Metabolic Panel:  Recent Labs Lab 02/24/13 1835 02/25/13 0453  NA 138 137  K 3.5* 4.0  CL 100 102  CO2 25 24  GLUCOSE 117* 99  BUN 11 10  CREATININE 0.71 0.63  CALCIUM 9.3 8.7   Liver Function Tests:  Recent Labs Lab 02/24/13 1835 02/25/13 0453  AST 22 24  ALT 13 11  ALKPHOS 93 79  BILITOT 0.5 0.5  PROT 6.1 5.9*  ALBUMIN 3.3* 2.9*   CBC:  Recent Labs Lab 02/24/13 1835 02/25/13 0453  WBC 6.6 6.1  NEUTROABS 5.2  --   HGB 14.1 13.4  HCT 41.7 41.1  MCV 89.3 91.1  PLT 194 177   BNP: BNP (last 3 results)  Recent Labs  02/07/13 1546  PROBNP 908.8*    SignedConley Canal:  York, Anaiya Wisinski L, PA-C [phone]  [facility] 02/26/2013, 12:43 PM

## 2013-02-26 NOTE — Progress Notes (Signed)
IV removed. [facility] Patient ready for discharge.

## 2013-02-26 NOTE — Discharge Summary (Signed)
  I have directly reviewed the clinical findings, lab, imaging studies and management of this patient in detail. I have interviewed and examined the patient and agree with the documentation,  as recorded by the Physician extender.  Leroy SeaSINGH,Jamare Vanatta K M.D on 02/26/2013 at 12:51 PM  [facility] Office  [phone]

## 2013-02-27 ENCOUNTER — Non-Acute Institutional Stay (SKILLED_NURSING_FACILITY): Payer: Medicare PPO | Admitting: Internal Medicine

## 2013-02-27 ENCOUNTER — Ambulatory Visit (HOSPITAL_COMMUNITY): Payer: Medicare PPO | Attending: Orthopaedic Surgery

## 2013-02-27 DIAGNOSIS — S52609A Unspecified fracture of lower end of unspecified ulna, initial encounter for closed fracture: Principal | ICD-10-CM

## 2013-02-27 DIAGNOSIS — J209 Acute bronchitis, unspecified: Secondary | ICD-10-CM

## 2013-02-27 DIAGNOSIS — M11239 Other chondrocalcinosis, unspecified wrist: Secondary | ICD-10-CM | POA: Insufficient documentation

## 2013-02-27 DIAGNOSIS — J09X2 Influenza due to identified novel influenza A virus with other respiratory manifestations: Secondary | ICD-10-CM

## 2013-02-27 DIAGNOSIS — X58XXXA Exposure to other specified factors, initial encounter: Secondary | ICD-10-CM | POA: Insufficient documentation

## 2013-02-27 DIAGNOSIS — S52509A Unspecified fracture of the lower end of unspecified radius, initial encounter for closed fracture: Secondary | ICD-10-CM | POA: Insufficient documentation

## 2013-02-27 DIAGNOSIS — I4891 Unspecified atrial fibrillation: Secondary | ICD-10-CM

## 2013-02-27 NOTE — Progress Notes (Signed)
Patient ID: Tamara HaggardBetty [name] [name], female   DOB: 03/25/1924, 80 y.o.   MRN: [id number]  Facility; [facility] Chief complaint; admission to SNF post admit to [facility] from 1/4 to 1/6  History; this is an 80 year old woman who lives at [facility]. She presented initially with a fall and was found to have a fracture of her left wrist the. A splint was placed and she was asked to followup with orthopedics. Later she was found to be warm to the touch with a temperature of 102 and a cough. She was sent to the hospital. She was tested as being influenza PCR positive for influenza A. the patient was treated with Tamiflu 75 mg twice a day for 5 days. She was felt to need pulmonary toilette she has Xopenex ordered.   Results for Tamara [name], Tamara [name] (MRN [id number]) as of 02/27/2013 08:43  Ref. Range 02/25/2013 04:53 02/26/2013 04:57  Sodium Latest Range: 137-147 mEq/L 137   Potassium Latest Range: 3.7-5.3 mEq/L 4.0   Chloride Latest Range: 96-112 mEq/L 102   CO2 Latest Range: 19-32 mEq/L 24   BUN Latest Range: 6-23 mg/dL 10   Creatinine Latest Range: 0.50-1.10 mg/dL 7.820.63   Calcium Latest Range: 8.4-10.5 mg/dL 8.7   GFR calc non Af Amer Latest Range: >90 mL/min 79 (L)   GFR calc Af Amer Latest Range: >90 mL/min >90   Glucose Latest Range: 70-99 mg/dL 99   Alkaline Phosphatase Latest Range: 39-117 U/L 79   Albumin Latest Range: 3.5-5.2 g/dL 2.9 (L)   AST Latest Range: 0-37 U/L 24   ALT Latest Range: 0-35 U/L 11   Total Protein Latest Range: 6.0-8.3 g/dL 5.9 (L)   Total Bilirubin Latest Range: 0.3-1.2 mg/dL 0.5   WBC Latest Range: 4.0-10.5 K/uL 6.1   RBC Latest Range: 3.87-5.11 MIL/uL 4.51   Hemoglobin Latest Range: 12.0-15.0 g/dL 95.613.4   HCT Latest Range: 36.0-46.0 % 41.1   MCV Latest Range: 78.0-100.0 fL 91.1   MCH Latest Range: 26.0-34.0 pg 29.7   MCHC Latest Range: 30.0-36.0 g/dL 21.332.6   RDW Latest Range: 11.5-15.5 % 15.0   Platelets Latest Range: 150-400 K/uL 177   Prothrombin Time  Latest Range: 11.6-15.2 seconds  21.2 (H)  INR Latest Range: 0.00-1.49   1.90 (H)        Study Result    CLINICAL DATA:  Fever, cough, congestion today   EXAM: CHEST - 1 VIEW   COMPARISON:  12/18/4 T   FINDINGS: Mild to moderate cardiac enlargement stable. Vascular pattern normal. Lungs clear except for mild subsegmental atelectasis in the lateral left lower lobe similar to the prior study.   IMPRESSION: No active disease.     Electronically Signed   By: Esperanza Heiraymond  Rubner M.D.   On: 02/24/2013 19:16   Past Medical History  Diagnosis Date  . Hypertension   . Atrial fibrillation     Onset well before 2004; Negative stress nuclear study in 01/2003  . Osteoporosis   . Acute colitis 09/2011    C. difficile negative  . Chronic anticoagulation   . Hyperlipidemia   . Hiatal hernia     by CT in 2013; also noted was an adrenal adenoma, duodenal lipoma right ovarian cyst, post hysterectomy  . Pneumonia 09/2011    09/2011   Past Surgical History  Procedure Laterality Date  . Abdominal hysterectomy      No oophorectomy  . Appendectomy    . Colonoscopy  2005    Reportedly normal screening study  Current Outpatient Prescriptions on File Prior to Visit  Medication Sig Dispense Refill  . acetaminophen (TYLENOL) 325 MG tablet Take 2 tablets (650 mg total) by mouth every 6 (six) hours as needed for mild pain (or Fever >/= 101).      Marland Kitchen alendronate (FOSAMAX) 70 MG tablet Take 70 mg by mouth once a week. Take with a full glass of water on an empty stomach.(on Sunday)      . diclofenac sodium (VOLTAREN) 1 % GEL Apply 4 g topically 3 (three) times daily as needed. pain      . diltiazem (CARDIZEM) 120 MG tablet Take 120 mg by mouth daily.      Marland Kitchen docusate sodium 100 MG CAPS Take 100 mg by mouth 2 (two) times daily.  10 capsule  0  . escitalopram (LEXAPRO) 10 MG tablet Take 1 tablet (10 mg total) by mouth daily.  30 tablet  3  . HYDROcodone-acetaminophen (NORCO/VICODIN) 5-325 MG per tablet  Take 1 tablet by mouth every 6 (six) hours as needed for moderate pain.  120 tablet  0  . levalbuterol (XOPENEX) 0.63 MG/3ML nebulizer solution Take 3 mLs (0.63 mg total) by nebulization every 6 (six) hours.  3 mL  12  . levothyroxine (SYNTHROID, LEVOTHROID) 100 MCG tablet Take 100 mcg by mouth daily before breakfast.      . memantine (NAMENDA) 10 MG tablet Take 10 mg by mouth 2 (two) times daily. Administered at 8am and 5pm      . omeprazole (PRILOSEC) 40 MG capsule Take 1 capsule (40 mg total) by mouth daily. For heartburn  30 capsule  3  . oseltamivir (TAMIFLU) 75 MG capsule Take 1 capsule (75 mg total) by mouth 2 (two) times daily.  4 capsule  0  . Polyethyl Glycol-Propyl Glycol (SYSTANE OP) Apply to eye 2 (two) times daily. 1 drop into both eyes      . potassium chloride (K-DUR) 10 MEQ tablet Take 10 mEq by mouth daily.       Marland Kitchen warfarin (COUMADIN) 3 MG tablet Take 4.5 mg by mouth See admin instructions. Take one and one-half tablet (4.5mg  total) on Tuesdays, Thursdays, Saturdays, and Sundays ONLY       No current facility-administered medications on file prior to visit.   History Lives in an assisted living [facility]. Exact functional status is unknown   Social History  . Marital Status: Widowed    Spouse Name: N/A    Number of Children: N/A  . Years of Education: N/A   Occupational History  . Retired    Social History Main Topics  . Smoking status: Never Smoker   . Smokeless tobacco: Never Used  . Alcohol Use: No  . Drug Use: No  . Sexual Activity: Not Currently   Other Topics Concern  . Not on file   Social History Narrative  . No narrative on file    Family History  Problem Relation Age of Onset  . Heart attack Father   . Cancer Mother   . Hypertension Father    Review of systems; Not possible due to degree of dementia however does not appear to be coughing or SOB. Afebrile   Physical Exam General;. Not in any distress.  Temperature; 97 2 pulse 68  respirations 18 blood pressure 160/90 pulse ox 95% on room Respiratory mildly prolonged expiratory phase but no overt wheezing no accessory muscle use and no respiratory distress Cardiac atrial fibrillation no murmurs no bruits she appears to be euvolemic  Abdomen; distended however bowel sounds are positive there is no liver no spleen and no tenderness GU; bladder is not overtly distended Rectal exam empty rectal vault no masses Mental status; patient stated she was 47, unaware of her she was living unaware of the year  Impression/plan #1 influenza A. She is afebrile and not coughing and on Tamiflu. Droplet precautions will probably soon be unnecessary #2 mild expiratory wheezing. Whether she has background chronic lung disease or this is simply a manifestation of her acute bronchitis is unclear probably the latter #3 atrial fibrillation rate is controlled on Coumadin. In somebody with probable severe dementia, a fall was raises an alarm in my mind about the risk benefit of continuing anticoagulation however I don't feel I am in the best setting right now to make this judgment #4 probably severe dementia with evidently background depression #5 on chronic anticoagulation #6 hypothyroidism on replacement #7 left wrist fracture in the setting of osteoporosis. If she was using a walker this will be a problem #8 hypertension we'll monitor while she is here  No major difficulties are evident

## 2013-03-01 LAB — CULTURE, BLOOD (ROUTINE X 2)
CULTURE: NO GROWTH
Culture: NO GROWTH

## 2013-03-06 ENCOUNTER — Ambulatory Visit (HOSPITAL_COMMUNITY): Payer: Medicare PPO | Attending: Internal Medicine

## 2013-03-06 ENCOUNTER — Non-Acute Institutional Stay (SKILLED_NURSING_FACILITY): Payer: Medicare PPO | Admitting: Internal Medicine

## 2013-03-06 DIAGNOSIS — M545 Low back pain, unspecified: Secondary | ICD-10-CM

## 2013-03-06 DIAGNOSIS — M47817 Spondylosis without myelopathy or radiculopathy, lumbosacral region: Secondary | ICD-10-CM | POA: Insufficient documentation

## 2013-03-06 DIAGNOSIS — E860 Dehydration: Secondary | ICD-10-CM

## 2013-03-06 DIAGNOSIS — E43 Unspecified severe protein-calorie malnutrition: Secondary | ICD-10-CM

## 2013-03-06 NOTE — Progress Notes (Signed)
Patient ID: Tamara [name], female   DOB: [birth year], 80 y.o.   MRN: [id number] [facility] Chief complaint; very poor oral intake History; this is a patient I admitted to the nursing home last week. She is an 80 year old woman who previously lived at [facility] assisted [facility]. She was admitted to hospital with a left wrist fracture. She was found to be warm to the touch with a temperature of 102 and a cough. She was tested his influenza A positive and was treated with Tamiflu for 5 days. Her albumin level during the hospitalization was 2.9 the rest of her comprehensive metabolic panel and CBC looked fairly normal. Chest x-ray showed mild subsegmental atelectasis. She is atrial fibrillation on Coumadin. She likely has moderate to severe background dementia.  Staff report the patient is eating and drinking very poorly. Lab work from 1/12 shows a completely normal CBC with a white count of 7.5, hemoglobin of 13.7, platelet count of 272. Her basic metabolic panel showed a sodium of 145 potassium 3.6 BUN of 12 and a creatinine of 0.63. Her Coumadin was held yesterday although I don't see the PT/INR result resulted in holding the Coumadin.  Review of systems Respiratory; patient complains of shortness of breath but no cough or chest pain Cardiac no clear exertional symptoms Abdomen soft; no abdominal pain and no diarrhea Musculoskeletal; patient is complaining of low back pain which is fairly constant GU perhaps some urinary frequency but no clear dysuria  Medications; Coumadin was put on hold yesterday. Patient is on Fosamax, diltiazem, Lexapro, Xopenex every 6, Synthroid 100 mcg daily, Namenda 10 mg twice a day, Prilosec 40 mg daily, K-Dur 10 milliequivalents daily,  Physical examination Gen. increasingly frail woman but not in overt distress Vitals temperature is 97.8, pulse 89 and atrial, respiratory rate 26, blood pressure 154/95, O2 sat is 98% on room  air Respiratory; clear entry bilaterally there is no crackles or wheezing work of breathing appears normal no accessory muscle use Cardiac; heart sounds are regular no murmurs. She appears to be slightly dehydrated Abdomen; bowel sounds are positive no liver no spleen no tenderness GU some suprapubic tenderness to palpation but she is not overtly distended no overt costovertebral angle tenderness Lumbar spine; no palpable tenderness no overt perivertebral muscle spasm Mental status; patient is not oriented to place city year month. She is not overtly delirious. Mildly depressed affect      CLINICAL DATA:  Fever, cough, congestion today   EXAM: CHEST - 1 VIEW   COMPARISON:  12/18/4 T   FINDINGS: Mild to moderate cardiac enlargement stable. Vascular pattern normal. Lungs clear except for mild subsegmental atelectasis in the lateral left lower lobe similar to the prior study.   IMPRESSION: No active disease.     Electronically Signed   By: Esperanza Heir M.D.   On: 02/24/2013 19:16   EXAM: CT LUMBAR SPINE WITHOUT CONTRAST   TECHNIQUE: Multidetector CT imaging of the lumbar spine was performed without intravenous contrast administration. Multiplanar CT image reconstructions were also generated.   COMPARISON:  Abdominal CT 09/28/2011   FINDINGS: No evidence of acute fracture. There is stable mild compression deformity of the L1 vertebral body, with upper body sclerosis. Remote left L1 and L2 transverse process fractures, with callus. No perivertebral edema. There is diffuse spondylosis and degenerative facet change, without evidence of significant osseous canal or foraminal stenosis.   Incidental 2.5 cm adenoma in the right adrenal gland. There is a nonobstructive lipoma within  the mid duodenum. A right adnexal/ovarian cyst is larger than before, now 5 x 4 cm as compared to 4 x 3 cm.   IMPRESSION: 1. No evidence of acute lumbar fracture. 2. Remote L1 superior  endplate fracture. Remote L1 and L2 left transverse process fractures. 3. Right adnexal/ovarian cyst has enlarged 1 cm since 09/28/2011, now 5 cm.     Electronically Signed   By: Tamara [name] M.D.   On: 02/24/2013 00:01  Impressions;  #1 at least moderate protein calorie malnutrition. This was present on her hospital lab work she has probably been working on this since. The exact cause of this is unclear. Certainly some of her medications may be contributing. I will recheck her lab work #2 probably mild dehydration. She will probably require IV fluids will see how she does at breakfast this morning #3 urinary frequency; I will check a urine culture urinalysis. At this point no empiric antibiotics #4 low back pain [CT scan of the lumbar spine below]. This is probably musculoskeletal I will try her on routine Tylenol extra strength. She did have a fall currently in the facility although she was complaining about that pain from the moment I saw her on 1/7 #5 elevated PT and INR with an INR yesterday and today at 3.62 #6 significant cognitive impairment with at least moderate probably moderate to severe dementia  I will check lab work on her including a comprehensive metabolic panel CBC and differential urine for urinalysis and CNS a plain x-ray of the back is probably indicated giving the fall

## 2013-03-13 ENCOUNTER — Non-Acute Institutional Stay (SKILLED_NURSING_FACILITY): Payer: Medicare PPO | Admitting: Internal Medicine

## 2013-03-13 DIAGNOSIS — I4891 Unspecified atrial fibrillation: Secondary | ICD-10-CM

## 2013-03-13 DIAGNOSIS — R269 Unspecified abnormalities of gait and mobility: Secondary | ICD-10-CM

## 2013-04-03 NOTE — Progress Notes (Signed)
Patient ID: Tamara [name], female   DOB: 09/19/1924, 80 y.o.   MRN: [id number]                PROGRESS NOTE  DATE:  03/13/2013    FACILITY: [facility]    LEVEL OF CARE:   SNF   Acute Visit/Discharge Visit    CHIEF COMPLAINT:   Pre-discharge review.     HISTORY OF PRESENT ILLNESS:  This is a patient who came to us, having fallen and developed a left wrist fracture.   In the hospital, she was found to have a temperature of 102 and was tested positive for influenza and pneumonia.  She was treated with Tamiflu.    Her albumin during the hospitalization was 2.9.    Chest x-ray showed subsegmental atelectasis.    She is on chronic Coumadin.    She probably has moderate background dementia.    When I last saw this patient on 03/06/2013, she was not eating well.  I was really concerned about failure to thrive syndrome.  However, she appears in the last week or so to have turned around.  She has lost 4 lbs while here.  However, she is eating better.    The patient's stay here has been cut short fairly abruptly as the patient wants to return to [facility] and her family is supporting her on this.    LABORATORY DATA:   Lab work on 03/11/2013 showed an INR of 2.09.  This is on Coumadin 3 mg.  It was increased to 3.5 as she had gone up from 2.81 on 03/08/2013.    Her lab work was last done on 03/06/2013.  Her CBC was essentially normal.  Comprehensive metabolic panel also normal other than an alk phos of 156, a calcium of 10.3, and an albumin of 3.3.  Otherwise, things seemed normal.     PHYSICAL EXAMINATION:   GENERAL APPEARANCE:  The patient does not look to be in any distress.  She certainly looks better than when I saw her 10 days ago.   CHEST/RESPIRATORY:  Clear air entry bilaterally.   CARDIOVASCULAR:  CARDIAC:   Heart sounds are normal other than irregularity.    There are no signs of heart failure.  She appears to be well hydrated.    NEUROLOGICAL:      BALANCE/GAIT:  She is very unsteady on her feet which concerns me quite a bit as she is returning to an independent living setting.    ASSESSMENT/PLAN:  Left wrist fracture.  She is still in a hard cast.    Atrial fibrillation.  On Coumadin.  Her INR will need to be followed up next week.    Gait ataxia.  This is probably mostly disuse as she has been ill.  She will need physical therapy, occupational therapy, and I have prescribed a lightweight wheelchair for her back at the assisted living.  Her general medical issues appear to be stable.  Her atrial fib is controlled.  PT/INR follow-up ordered.    CPT CODE: 99315      

## 2013-04-08 NOTE — Progress Notes (Deleted)
Patient ID: Tamara [name], female   DOB: 08/22/1924, 80 y.o.   MRN: [id number]

## 2014-05-27 ENCOUNTER — Emergency Department (HOSPITAL_COMMUNITY)
Admission: EM | Admit: 2014-05-27 | Discharge: 2014-05-27 | Disposition: A | Discharge status: Home / Self Care | Payer: Medicare PPO | Source: Home / Self Care | Attending: Emergency Medicine | Admitting: Emergency Medicine

## 2014-05-27 ENCOUNTER — Encounter (HOSPITAL_COMMUNITY): Payer: Self-pay | Admitting: Emergency Medicine

## 2014-05-27 ENCOUNTER — Emergency Department (HOSPITAL_COMMUNITY): Payer: Medicare PPO

## 2014-05-27 DIAGNOSIS — R509 Fever, unspecified: Secondary | ICD-10-CM

## 2014-05-27 DIAGNOSIS — W19XXXA Unspecified fall, initial encounter: Secondary | ICD-10-CM

## 2014-05-27 DIAGNOSIS — Z90710 Acquired absence of both cervix and uterus: Secondary | ICD-10-CM | POA: Diagnosis not present

## 2014-05-27 DIAGNOSIS — A4151 Sepsis due to Escherichia coli [E. coli]: Secondary | ICD-10-CM | POA: Diagnosis present

## 2014-05-27 DIAGNOSIS — M810 Age-related osteoporosis without current pathological fracture: Secondary | ICD-10-CM | POA: Diagnosis present

## 2014-05-27 DIAGNOSIS — B9620 Unspecified Escherichia coli [E. coli] as the cause of diseases classified elsewhere: Secondary | ICD-10-CM | POA: Diagnosis present

## 2014-05-27 DIAGNOSIS — A419 Sepsis, unspecified organism: Secondary | ICD-10-CM | POA: Diagnosis not present

## 2014-05-27 DIAGNOSIS — E876 Hypokalemia: Secondary | ICD-10-CM | POA: Diagnosis present

## 2014-05-27 DIAGNOSIS — I10 Essential (primary) hypertension: Secondary | ICD-10-CM | POA: Diagnosis present

## 2014-05-27 DIAGNOSIS — Z7901 Long term (current) use of anticoagulants: Secondary | ICD-10-CM | POA: Diagnosis not present

## 2014-05-27 DIAGNOSIS — E785 Hyperlipidemia, unspecified: Secondary | ICD-10-CM | POA: Diagnosis present

## 2014-05-27 DIAGNOSIS — M2580 Other specified joint disorders, unspecified joint: Secondary | ICD-10-CM | POA: Diagnosis not present

## 2014-05-27 DIAGNOSIS — R262 Difficulty in walking, not elsewhere classified: Secondary | ICD-10-CM | POA: Diagnosis not present

## 2014-05-27 DIAGNOSIS — I482 Chronic atrial fibrillation: Secondary | ICD-10-CM | POA: Diagnosis not present

## 2014-05-27 DIAGNOSIS — I4891 Unspecified atrial fibrillation: Secondary | ICD-10-CM | POA: Diagnosis present

## 2014-05-27 DIAGNOSIS — J40 Bronchitis, not specified as acute or chronic: Secondary | ICD-10-CM | POA: Diagnosis not present

## 2014-05-27 DIAGNOSIS — A4150 Gram-negative sepsis, unspecified: Secondary | ICD-10-CM | POA: Diagnosis not present

## 2014-05-27 DIAGNOSIS — N390 Urinary tract infection, site not specified: Secondary | ICD-10-CM | POA: Diagnosis present

## 2014-05-27 DIAGNOSIS — R7881 Bacteremia: Secondary | ICD-10-CM | POA: Diagnosis not present

## 2014-05-27 DIAGNOSIS — M6281 Muscle weakness (generalized): Secondary | ICD-10-CM | POA: Diagnosis not present

## 2014-05-27 DIAGNOSIS — F0390 Unspecified dementia without behavioral disturbance: Secondary | ICD-10-CM | POA: Diagnosis present

## 2014-05-27 DIAGNOSIS — M1990 Unspecified osteoarthritis, unspecified site: Secondary | ICD-10-CM | POA: Diagnosis present

## 2014-05-27 DIAGNOSIS — M545 Low back pain: Secondary | ICD-10-CM | POA: Diagnosis not present

## 2014-05-27 DIAGNOSIS — I480 Paroxysmal atrial fibrillation: Secondary | ICD-10-CM | POA: Diagnosis not present

## 2014-05-27 DIAGNOSIS — K529 Noninfective gastroenteritis and colitis, unspecified: Secondary | ICD-10-CM

## 2014-05-27 HISTORY — DX: Hypercalcemia: E83.52

## 2014-05-27 HISTORY — DX: Pericardial effusion (noninflammatory): I31.3

## 2014-05-27 HISTORY — DX: Syncope and collapse: R55

## 2014-05-27 HISTORY — DX: Other pericardial effusion (noninflammatory): I31.39

## 2014-05-27 LAB — COMPREHENSIVE METABOLIC PANEL
ALK PHOS: 84 U/L (ref 39–117)
ALT: 17 U/L (ref 0–35)
AST: 39 U/L — ABNORMAL HIGH (ref 0–37)
Albumin: 4.2 g/dL (ref 3.5–5.2)
Anion gap: 9 (ref 5–15)
BUN: 17 mg/dL (ref 6–23)
CHLORIDE: 104 mmol/L (ref 96–112)
CO2: 26 mmol/L (ref 19–32)
Calcium: 9.8 mg/dL (ref 8.4–10.5)
Creatinine, Ser: 0.75 mg/dL (ref 0.50–1.10)
GFR calc Af Amer: 86 mL/min — ABNORMAL LOW (ref >90)
GFR, EST NON AFRICAN AMERICAN: 74 mL/min — AB (ref >90)
GLUCOSE: 127 mg/dL — AB (ref 70–99)
POTASSIUM: 3.4 mmol/L — AB (ref 3.5–5.1)
SODIUM: 139 mmol/L (ref 135–145)
Total Bilirubin: 1.2 mg/dL (ref 0.3–1.2)
Total Protein: 7.5 g/dL (ref 6.0–8.3)

## 2014-05-27 LAB — CBC WITH DIFFERENTIAL/PLATELET
BASOS ABS: 0 10*3/uL (ref 0.0–0.1)
BASOS PCT: 0 % (ref 0–1)
Eosinophils Absolute: 0 10*3/uL (ref 0.0–0.7)
Eosinophils Relative: 0 % (ref 0–5)
HCT: 45.9 % (ref 36.0–46.0)
Hemoglobin: 15.4 g/dL — ABNORMAL HIGH (ref 12.0–15.0)
LYMPHS PCT: 4 % — AB (ref 12–46)
Lymphs Abs: 0.6 10*3/uL — ABNORMAL LOW (ref 0.7–4.0)
MCH: 32.8 pg (ref 26.0–34.0)
MCHC: 33.6 g/dL (ref 30.0–36.0)
MCV: 97.7 fL (ref 78.0–100.0)
Monocytes Absolute: 1 10*3/uL (ref 0.1–1.0)
Monocytes Relative: 7 % (ref 3–12)
NEUTROS ABS: 13.9 10*3/uL — AB (ref 1.7–7.7)
Neutrophils Relative %: 89 % — ABNORMAL HIGH (ref 43–77)
PLATELETS: 165 10*3/uL (ref 150–400)
RBC: 4.7 MIL/uL (ref 3.87–5.11)
RDW: 13.3 % (ref 11.5–15.5)
WBC: 15.4 10*3/uL — ABNORMAL HIGH (ref 4.0–10.5)

## 2014-05-27 LAB — URINALYSIS, ROUTINE W REFLEX MICROSCOPIC
BILIRUBIN URINE: NEGATIVE
GLUCOSE, UA: NEGATIVE mg/dL
Ketones, ur: NEGATIVE mg/dL
Nitrite: NEGATIVE
PH: 5.5 (ref 5.0–8.0)
Protein, ur: 30 mg/dL — AB
Specific Gravity, Urine: 1.025 (ref 1.005–1.030)
UROBILINOGEN UA: 0.2 mg/dL (ref 0.0–1.0)

## 2014-05-27 LAB — URINE MICROSCOPIC-ADD ON

## 2014-05-27 MED ORDER — SODIUM CHLORIDE 0.9 % IV BOLUS (SEPSIS)
500.0000 mL | Freq: Once | INTRAVENOUS | Status: AC
  Administered 2014-05-27: 500 mL via INTRAVENOUS

## 2014-05-27 MED ORDER — CEPHALEXIN 500 MG PO CAPS: 500.0000 mg | ORAL_CAPSULE | Freq: Three times a day (TID) | ORAL | Status: DC

## 2014-05-27 MED ORDER — DEXTROSE 5 % IV SOLN
1.0000 g | Freq: Once | INTRAVENOUS | Status: AC
  Administered 2014-05-27: 1 g via INTRAVENOUS
  Filled 2014-05-27: qty 10

## 2014-05-27 NOTE — Discharge Instructions (Signed)
Keflex as prescribed.  We will call you if your cultures suggest you require further treatment.  Return to the emergency department if your symptoms significantly worsen or change.   Urinary Tract Infection Urinary tract infections (UTIs) can develop anywhere along your urinary tract. Your urinary tract is your body's drainage system for removing wastes and extra water. Your urinary tract includes two kidneys, two ureters, a bladder, and a urethra. Your kidneys are a pair of bean-shaped organs. Each kidney is about the size of your fist. They are located below your ribs, one on each side of your spine. CAUSES Infections are caused by microbes, which are microscopic organisms, including fungi, viruses, and bacteria. These organisms are so small that they can only be seen through a microscope. Bacteria are the microbes that most commonly cause UTIs. SYMPTOMS  Symptoms of UTIs may vary by age and gender of the patient and by the location of the infection. Symptoms in young women typically include a frequent and intense urge to urinate and a painful, burning feeling in the bladder or urethra during urination. Older women and men are more likely to be tired, shaky, and weak and have muscle aches and abdominal pain. A fever may mean the infection is in your kidneys. Other symptoms of a kidney infection include pain in your back or sides below the ribs, nausea, and vomiting. DIAGNOSIS To diagnose a UTI, your caregiver will ask you about your symptoms. Your caregiver also will ask to provide a urine sample. The urine sample will be tested for bacteria and white blood cells. White blood cells are made by your body to help fight infection. TREATMENT  Typically, UTIs can be treated with medication. Because most UTIs are caused by a bacterial infection, they usually can be treated with the use of antibiotics. The choice of antibiotic and length of treatment depend on your symptoms and the type of bacteria  causing your infection. HOME CARE INSTRUCTIONS  If you were prescribed antibiotics, take them exactly as your caregiver instructs you. Finish the medication even if you feel better after you have only taken some of the medication.  Drink enough water and fluids to keep your urine clear or pale yellow.  Avoid caffeine, tea, and carbonated beverages. They tend to irritate your bladder.  Empty your bladder often. Avoid holding urine for long periods of time.  Empty your bladder before and after sexual intercourse.  After a bowel movement, women should cleanse from front to back. Use each tissue only once. SEEK MEDICAL CARE IF:   You have back pain.  You develop a fever.  Your symptoms do not begin to resolve within 3 days. SEEK IMMEDIATE MEDICAL CARE IF:   You have severe back pain or lower abdominal pain.  You develop chills.  You have nausea or vomiting.  You have continued burning or discomfort with urination. MAKE SURE YOU:   Understand these instructions.  Will watch your condition.  Will get help right away if you are not doing well or get worse. Document Released: 11/17/2004 Document Revised: 08/09/2011 Document Reviewed: 03/18/2011 [facility] Patient Information 2015 [place], [place]. This information is not intended to replace advice given to you by your health care provider. Make sure you discuss any questions you have with your health care provider.

## 2014-05-27 NOTE — ED Notes (Signed)
[facility].  Coming to get pt.

## 2014-05-27 NOTE — ED Notes (Signed)
Pt rolled out of bed falling on buttocks, c/o of lower back pain.

## 2014-05-27 NOTE — ED Provider Notes (Signed)
CSN: [id number]     Arrival date & time 05/27/14  0400 History   First MD Initiated Contact with Patient 05/27/14 0455     Chief Complaint  Patient presents with  . Fall     (Consider location/radiation/quality/duration/timing/severity/associated sxs/prior Treatment) HPI Comments: Patient is an 81 year old female brought by EMS after a fall. She states she "slid out of bed"and landed on her rear end. She is complaining of pain in her low back and tailbone. She denies having struck her head. She denies any hip pain or extremity pain.  She does report vomiting and diarrhea for the past 2 days. She was given a nausea pill by her primary doctor, however this has not helped.  Patient is a 81 y.o. female presenting with fall. The history is provided by the patient.  Fall This is a new problem. The current episode started 1 to 2 hours ago. The problem occurs constantly. The problem has not changed since onset.Pertinent negatives include no chest pain and no abdominal pain. Nothing aggravates the symptoms. Nothing relieves the symptoms. She has tried nothing for the symptoms. The treatment provided no relief.    Past Medical History  Diagnosis Date  . Hypertension   . Atrial fibrillation     Onset well before 2004; Negative stress nuclear study in 01/2003  . Osteoporosis   . Acute colitis 09/2011    C. difficile negative  . Chronic anticoagulation   . Hyperlipidemia   . Hiatal hernia     by CT in 2013; also noted was an adrenal adenoma, duodenal lipoma right ovarian cyst, post hysterectomy  . Pneumonia 09/2011    09/2011  . Syncope   . Pericardial effusion   . Hypercalcemia    Past Surgical History  Procedure Laterality Date  . Abdominal hysterectomy      No oophorectomy  . Appendectomy    . Colonoscopy  2005    Reportedly normal screening study   Family History  Problem Relation Age of Onset  . Heart attack Father   . Cancer Mother   . Hypertension Father    History   Substance Use Topics  . Smoking status: Never Smoker   . Smokeless tobacco: Never Used  . Alcohol Use: No   OB History    No data available     Review of Systems  Cardiovascular: Negative for chest pain.  Gastrointestinal: Negative for abdominal pain.  All other systems reviewed and are negative.     Allergies  Review of patient's allergies indicates no known allergies.  Home Medications   Prior to Admission medications   Medication Sig Start Date End Date Taking? Authorizing Provider  acetaminophen (TYLENOL) 325 MG tablet Take 2 tablets (650 mg total) by mouth every 6 (six) hours as needed for mild pain (or Fever >/= 101). 02/26/13  Yes Marianne L York, PA-C  diltiazem (CARDIZEM) 120 MG tablet Take 120 mg by mouth daily. 02/06/13  Yes Historical Provider, MD  docusate sodium 100 MG CAPS Take 100 mg by mouth 2 (two) times daily. 02/26/13  Yes Marianne L York, PA-C  escitalopram (LEXAPRO) 10 MG tablet Take 1 tablet (10 mg total) by mouth daily. 05/14/12  Yes Sharon Seller, NP  levothyroxine (SYNTHROID, LEVOTHROID) 100 MCG tablet Take 100 mcg by mouth daily before breakfast.   Yes Historical Provider, MD  memantine (NAMENDA) 10 MG tablet Take 10 mg by mouth 2 (two) times daily. Administered at 8am and 5pm   Yes Historical Provider, MD  Polyethyl Glycol-Propyl Glycol (SYSTANE OP) Apply to eye 2 (two) times daily. 1 drop into both eyes   Yes Historical Provider, MD  potassium chloride (K-DUR) 10 MEQ tablet Take 10 mEq by mouth daily.  08/13/12  Yes Historical Provider, MD  warfarin (COUMADIN) 3 MG tablet Take 4.5 mg by mouth See admin instructions. Take one and one-half tablet (4.5mg  total) on Tuesdays, Thursdays, Saturdays, and Sundays ONLY   Yes Historical Provider, MD  alendronate (FOSAMAX) 70 MG tablet Take 70 mg by mouth once a week. Take with a full glass of water on an empty stomach.(on Sunday)    Historical Provider, MD  diclofenac sodium (VOLTAREN) 1 % GEL Apply 4 g topically 3  (three) times daily as needed. pain    Historical Provider, MD  HYDROcodone-acetaminophen (NORCO/VICODIN) 5-325 MG per tablet Take 1 tablet by mouth every 6 (six) hours as needed for moderate pain. 02/26/13   Kimber RelicArthur G Green, MD  levalbuterol Pauline Aus(XOPENEX) 0.63 MG/3ML nebulizer solution Take 3 mLs (0.63 mg total) by nebulization every 6 (six) hours. 02/26/13   Tora KindredMarianne L York, PA-C  omeprazole (PRILOSEC) 40 MG capsule Take 1 capsule (40 mg total) by mouth daily. For heartburn 05/14/12   Sharon SellerJessica K Eubanks, NP  oseltamivir (TAMIFLU) 75 MG capsule Take 1 capsule (75 mg total) by mouth 2 (two) times daily. 02/26/13   Marianne L York, PA-C   BP 159/88 mmHg  Pulse 79  Temp(Src) 100.6 F (38.1 C) (Oral)  Resp 16  Ht 5\' 10"  (1.778 m)  SpO2 96% Physical Exam  Constitutional: She is oriented to person, place, and time. She appears well-developed and well-nourished. No distress.  HENT:  Head: Normocephalic and atraumatic.  Mouth/Throat: Oropharynx is clear and moist.  Neck: Normal range of motion. Neck supple.  Cardiovascular: Normal rate and regular rhythm.  Exam reveals no gallop and no friction rub.   No murmur heard. Pulmonary/Chest: Effort normal and breath sounds normal. No respiratory distress. She has no wheezes.  Abdominal: Soft. Bowel sounds are normal. She exhibits no distension. There is no tenderness.  Musculoskeletal: Normal range of motion. She exhibits no edema.  Neurological: She is alert and oriented to person, place, and time.  Skin: Skin is warm and dry. She is not diaphoretic.  Nursing note and vitals reviewed.   ED Course  Procedures (including critical care time) Labs Review Labs Reviewed  URINE CULTURE  CULTURE, BLOOD (ROUTINE X 2)  CULTURE, BLOOD (ROUTINE X 2)  COMPREHENSIVE METABOLIC PANEL  CBC WITH DIFFERENTIAL/PLATELET  URINALYSIS, ROUTINE W REFLEX MICROSCOPIC    Imaging Review No results found.   EKG Interpretation None      MDM   Final diagnoses:  Fall     Patient presents after a fall at the nursing home. X-rays of the lumbar spine and pelvis are negative for fracture.  She also reports nausea, vomiting, and diarrhea since yesterday and was found to be febrile with a temperature of 100.6. Febrile workup was initiated including blood cultures, chest x-ray, urinalysis. This did reveal evidence for a UTI which will be treated with Rocephin and Keflex. Patient appears stable and I believe is appropriate for discharge. She will be treated with Keflex for her UTI and advised to return if her symptoms significantly worsen or change.   Geoffery Lyonsouglas Rai Sinagra, MD 05/27/14 [phone]

## 2014-05-28 ENCOUNTER — Emergency Department (HOSPITAL_COMMUNITY): Payer: Medicare PPO

## 2014-05-28 ENCOUNTER — Telehealth (HOSPITAL_COMMUNITY): Payer: Self-pay | Admitting: *Deleted

## 2014-05-28 ENCOUNTER — Inpatient Hospital Stay (HOSPITAL_COMMUNITY)
Admission: EM | Admit: 2014-05-28 | Discharge: 2014-05-30 | DRG: 872 | Disposition: A | Discharge status: Skilled Nursing Facility | Payer: Medicare PPO | Attending: Internal Medicine | Admitting: Internal Medicine

## 2014-05-28 ENCOUNTER — Encounter (HOSPITAL_COMMUNITY): Payer: Self-pay | Admitting: Emergency Medicine

## 2014-05-28 DIAGNOSIS — B9620 Unspecified Escherichia coli [E. coli] as the cause of diseases classified elsewhere: Secondary | ICD-10-CM | POA: Diagnosis present

## 2014-05-28 DIAGNOSIS — I4891 Unspecified atrial fibrillation: Secondary | ICD-10-CM | POA: Diagnosis present

## 2014-05-28 DIAGNOSIS — R7881 Bacteremia: Secondary | ICD-10-CM | POA: Diagnosis not present

## 2014-05-28 DIAGNOSIS — Z7901 Long term (current) use of anticoagulants: Secondary | ICD-10-CM

## 2014-05-28 DIAGNOSIS — E876 Hypokalemia: Secondary | ICD-10-CM | POA: Diagnosis present

## 2014-05-28 DIAGNOSIS — A419 Sepsis, unspecified organism: Secondary | ICD-10-CM | POA: Diagnosis present

## 2014-05-28 DIAGNOSIS — F0390 Unspecified dementia without behavioral disturbance: Secondary | ICD-10-CM | POA: Diagnosis present

## 2014-05-28 DIAGNOSIS — E785 Hyperlipidemia, unspecified: Secondary | ICD-10-CM | POA: Diagnosis present

## 2014-05-28 DIAGNOSIS — Z90710 Acquired absence of both cervix and uterus: Secondary | ICD-10-CM

## 2014-05-28 DIAGNOSIS — N390 Urinary tract infection, site not specified: Secondary | ICD-10-CM | POA: Diagnosis present

## 2014-05-28 DIAGNOSIS — I480 Paroxysmal atrial fibrillation: Secondary | ICD-10-CM

## 2014-05-28 DIAGNOSIS — M810 Age-related osteoporosis without current pathological fracture: Secondary | ICD-10-CM | POA: Diagnosis present

## 2014-05-28 DIAGNOSIS — A4151 Sepsis due to Escherichia coli [E. coli]: Secondary | ICD-10-CM | POA: Diagnosis present

## 2014-05-28 DIAGNOSIS — A4150 Gram-negative sepsis, unspecified: Secondary | ICD-10-CM

## 2014-05-28 DIAGNOSIS — I10 Essential (primary) hypertension: Secondary | ICD-10-CM | POA: Diagnosis present

## 2014-05-28 DIAGNOSIS — I951 Orthostatic hypotension: Secondary | ICD-10-CM

## 2014-05-28 DIAGNOSIS — R509 Fever, unspecified: Secondary | ICD-10-CM

## 2014-05-28 DIAGNOSIS — M1990 Unspecified osteoarthritis, unspecified site: Secondary | ICD-10-CM | POA: Diagnosis present

## 2014-05-28 HISTORY — DX: Unspecified dementia, unspecified severity, without behavioral disturbance, psychotic disturbance, mood disturbance, and anxiety: F03.90

## 2014-05-28 LAB — COMPREHENSIVE METABOLIC PANEL
ALBUMIN: 3.5 g/dL (ref 3.5–5.2)
ALT: 18 U/L (ref 0–35)
AST: 43 U/L — AB (ref 0–37)
Alkaline Phosphatase: 67 U/L (ref 39–117)
Anion gap: 9 (ref 5–15)
BUN: 14 mg/dL (ref 6–23)
CO2: 25 mmol/L (ref 19–32)
Calcium: 9.3 mg/dL (ref 8.4–10.5)
Chloride: 106 mmol/L (ref 96–112)
Creatinine, Ser: 0.66 mg/dL (ref 0.50–1.10)
GFR calc Af Amer: 89 mL/min — ABNORMAL LOW (ref >90)
GFR calc non Af Amer: 77 mL/min — ABNORMAL LOW (ref >90)
Glucose, Bld: 105 mg/dL — ABNORMAL HIGH (ref 70–99)
POTASSIUM: 3.2 mmol/L — AB (ref 3.5–5.1)
SODIUM: 140 mmol/L (ref 135–145)
TOTAL PROTEIN: 6.5 g/dL (ref 6.0–8.3)
Total Bilirubin: 0.7 mg/dL (ref 0.3–1.2)

## 2014-05-28 LAB — CBC WITH DIFFERENTIAL/PLATELET
Basophils Absolute: 0 10*3/uL (ref 0.0–0.1)
Basophils Relative: 0 % (ref 0–1)
EOS ABS: 0 10*3/uL (ref 0.0–0.7)
Eosinophils Relative: 0 % (ref 0–5)
HCT: 44.1 % (ref 36.0–46.0)
Hemoglobin: 14.7 g/dL (ref 12.0–15.0)
LYMPHS ABS: 0.7 10*3/uL (ref 0.7–4.0)
Lymphocytes Relative: 11 % — ABNORMAL LOW (ref 12–46)
MCH: 32.2 pg (ref 26.0–34.0)
MCHC: 33.3 g/dL (ref 30.0–36.0)
MCV: 96.7 fL (ref 78.0–100.0)
MONO ABS: 0.4 10*3/uL (ref 0.1–1.0)
Monocytes Relative: 7 % (ref 3–12)
NEUTROS PCT: 82 % — AB (ref 43–77)
Neutro Abs: 5 10*3/uL (ref 1.7–7.7)
Platelets: 130 10*3/uL — ABNORMAL LOW (ref 150–400)
RBC: 4.56 MIL/uL (ref 3.87–5.11)
RDW: 13.3 % (ref 11.5–15.5)
WBC: 6.1 10*3/uL (ref 4.0–10.5)

## 2014-05-28 LAB — LIPASE, BLOOD: Lipase: 20 U/L (ref 11–59)

## 2014-05-28 LAB — URINALYSIS, ROUTINE W REFLEX MICROSCOPIC
BILIRUBIN URINE: NEGATIVE
Glucose, UA: NEGATIVE mg/dL
KETONES UR: NEGATIVE mg/dL
Nitrite: POSITIVE — AB
Protein, ur: 30 mg/dL — AB
Specific Gravity, Urine: 1.025 (ref 1.005–1.030)
Urobilinogen, UA: 0.2 mg/dL (ref 0.0–1.0)
pH: 6 (ref 5.0–8.0)

## 2014-05-28 LAB — PROTIME-INR
INR: 2.12 — AB (ref 0.00–1.49)
Prothrombin Time: 23.9 seconds — ABNORMAL HIGH (ref 11.6–15.2)

## 2014-05-28 LAB — MAGNESIUM: MAGNESIUM: 1.5 mg/dL (ref 1.5–2.5)

## 2014-05-28 LAB — URINE MICROSCOPIC-ADD ON

## 2014-05-28 LAB — I-STAT CG4 LACTIC ACID, ED: LACTIC ACID, VENOUS: 1.52 mmol/L (ref 0.5–2.0)

## 2014-05-28 LAB — LACTIC ACID, PLASMA: LACTIC ACID, VENOUS: 1.4 mmol/L (ref 0.5–2.0)

## 2014-05-28 LAB — I-STAT TROPONIN, ED: Troponin i, poc: 0 ng/mL (ref 0.00–0.08)

## 2014-05-28 LAB — PROCALCITONIN: Procalcitonin: 0.68 ng/mL

## 2014-05-28 MED ORDER — ESCITALOPRAM OXALATE 10 MG PO TABS
10.0000 mg | ORAL_TABLET | Freq: Every day | ORAL | Status: DC
  Administered 2014-05-28 – 2014-05-30 (×3): 10 mg via ORAL
  Filled 2014-05-28 (×3): qty 1

## 2014-05-28 MED ORDER — SODIUM CHLORIDE 0.9 % IV BOLUS (SEPSIS)
500.0000 mL | Freq: Once | INTRAVENOUS | Status: AC
  Administered 2014-05-28: 500 mL via INTRAVENOUS

## 2014-05-28 MED ORDER — LEVOTHYROXINE SODIUM 100 MCG PO TABS
100.0000 ug | ORAL_TABLET | Freq: Every day | ORAL | Status: DC
  Administered 2014-05-29 – 2014-05-30 (×2): 100 ug via ORAL
  Filled 2014-05-28 (×2): qty 1

## 2014-05-28 MED ORDER — ACETAMINOPHEN 500 MG PO TABS
1000.0000 mg | ORAL_TABLET | Freq: Once | ORAL | Status: DC
  Filled 2014-05-28: qty 2

## 2014-05-28 MED ORDER — POLYVINYL ALCOHOL 1.4 % OP SOLN
1.0000 [drp] | Freq: Two times a day (BID) | OPHTHALMIC | Status: DC
  Administered 2014-05-28 – 2014-05-30 (×4): 1 [drp] via OPHTHALMIC
  Filled 2014-05-28: qty 15

## 2014-05-28 MED ORDER — DICLOFENAC SODIUM 1 % TD GEL
4.0000 g | Freq: Three times a day (TID) | TRANSDERMAL | Status: DC | PRN
  Filled 2014-05-28: qty 100

## 2014-05-28 MED ORDER — DEXTROSE 5 % IV SOLN
2.0000 g | INTRAVENOUS | Status: DC
  Administered 2014-05-29 – 2014-05-30 (×2): 2 g via INTRAVENOUS
  Filled 2014-05-28 (×5): qty 2

## 2014-05-28 MED ORDER — WARFARIN SODIUM 5 MG PO TABS
5.0000 mg | ORAL_TABLET | Freq: Once | ORAL | Status: AC
  Administered 2014-05-28: 5 mg via ORAL
  Filled 2014-05-28: qty 1

## 2014-05-28 MED ORDER — MEMANTINE HCL 10 MG PO TABS
10.0000 mg | ORAL_TABLET | Freq: Two times a day (BID) | ORAL | Status: DC
  Administered 2014-05-28 – 2014-05-30 (×5): 10 mg via ORAL
  Filled 2014-05-28 (×5): qty 1

## 2014-05-28 MED ORDER — DEXTROSE 5 % IV SOLN
2.0000 g | Freq: Once | INTRAVENOUS | Status: AC
  Administered 2014-05-28: 2 g via INTRAVENOUS
  Filled 2014-05-28: qty 2

## 2014-05-28 MED ORDER — ACETAMINOPHEN 325 MG PO TABS
650.0000 mg | ORAL_TABLET | Freq: Four times a day (QID) | ORAL | Status: DC | PRN
  Administered 2014-05-30: 650 mg via ORAL
  Filled 2014-05-28: qty 2

## 2014-05-28 MED ORDER — POTASSIUM CHLORIDE CRYS ER 10 MEQ PO TBCR
10.0000 meq | EXTENDED_RELEASE_TABLET | Freq: Every day | ORAL | Status: DC
  Administered 2014-05-28 – 2014-05-30 (×3): 10 meq via ORAL
  Filled 2014-05-28 (×3): qty 1

## 2014-05-28 MED ORDER — SODIUM CHLORIDE 0.9 % IV SOLN
INTRAVENOUS | Status: DC
  Administered 2014-05-28 – 2014-05-29 (×3): via INTRAVENOUS

## 2014-05-28 MED ORDER — POLYETHYL GLYCOL-PROPYL GLYCOL 0.4-0.3 % OP SOLN
Freq: Two times a day (BID) | OPHTHALMIC | Status: DC
  Filled 2014-05-28 (×4): qty 5

## 2014-05-28 MED ORDER — PANTOPRAZOLE SODIUM 40 MG PO TBEC
40.0000 mg | DELAYED_RELEASE_TABLET | Freq: Every day | ORAL | Status: DC
Start: 2014-05-28 — End: 2014-05-30
  Administered 2014-05-28 – 2014-05-30 (×3): 40 mg via ORAL
  Filled 2014-05-28 (×3): qty 1

## 2014-05-28 MED ORDER — WARFARIN - PHARMACIST DOSING INPATIENT
Status: DC
  Administered 2014-05-28 – 2014-05-29 (×2)

## 2014-05-28 MED ORDER — ONDANSETRON HCL 4 MG/2ML IJ SOLN: 4.0000 mg | Freq: Four times a day (QID) | INTRAMUSCULAR | Status: DC | PRN

## 2014-05-28 MED ORDER — ONDANSETRON HCL 4 MG PO TABS: 4.0000 mg | ORAL_TABLET | Freq: Four times a day (QID) | ORAL | Status: DC | PRN

## 2014-05-28 MED ORDER — DEXTROSE 5 % IV SOLN
2.0000 g | Freq: Once | INTRAVENOUS | Status: DC
  Filled 2014-05-28: qty 2

## 2014-05-28 MED ORDER — ALENDRONATE SODIUM 70 MG PO TABS: 70.0000 mg | ORAL_TABLET | ORAL | Status: DC

## 2014-05-28 NOTE — Progress Notes (Signed)
ANTIBIOTIC CONSULT NOTE - INITIAL  Pharmacy Consult for Cefepime Indication: rule out sepsis  No Known Allergies  Patient Measurements: Height: 5\' 10"  (177.8 cm) Weight: 139 lb (63.05 kg) IBW/kg (Calculated) : 68.5  Vital Signs: Temp: 97.6 F (36.4 C) (04/06 1114) Temp Source: Oral (04/06 1114) BP: 113/61 mmHg (04/06 1114) Pulse Rate: 67 (04/06 1114) Intake/Output from previous day:   Intake/Output from this shift:    Labs:  Recent Labs  05/27/14 0529 05/28/14 0755  WBC 15.4* 6.1  HGB 15.4* 14.7  PLT 165 130*  CREATININE 0.75 0.66   Estimated Creatinine Clearance: 49.4 mL/min (by C-G formula based on Cr of 0.66). No results for input(s): VANCOTROUGH, VANCOPEAK, VANCORANDOM, GENTTROUGH, GENTPEAK, GENTRANDOM, TOBRATROUGH, TOBRAPEAK, TOBRARND, AMIKACINPEAK, AMIKACINTROU, AMIKACIN in the last 72 hours.   Microbiology: Recent Results (from the past 720 hour(s))  Blood culture (routine x 2)     Status: None (Preliminary result)   Collection Time: 05/27/14  5:29 AM  Result Value Ref Range Status   Specimen Description BLOOD RIGHT ANTECUBITAL DRAWN BY RN YW  Final   Special Requests BOTTLES DRAWN AEROBIC AND ANAEROBIC [facility]7CC EACH  Final   Culture   Final    GRAM NEGATIVE RODS Gram Stain Report Called to,Read Back By and Verified With: FLOW MANAGER NEWMAN K AT 0200 ON [id number] BY FORSYTH K [facility]    Report Status PENDING  Incomplete  Blood culture (routine x 2)     Status: None (Preliminary result)   Collection Time: 05/27/14  5:40 AM  Result Value Ref Range Status   Specimen Description BLOOD LEFT HAND  Final   Special Requests   Final    BOTTLES DRAWN AEROBIC AND ANAEROBIC AEB=6CC ANA=4CC   Culture NO GROWTH 1 DAY  Final   Report Status PENDING  Incomplete  Blood Culture (routine x 2)     Status: None (Preliminary result)   Collection Time: 05/28/14  8:02 AM  Result Value Ref Range Status   Specimen Description BLOOD  Final   Special  Requests BLOOD  Final   Culture NO GROWTH <24 HRS  Final   Report Status PENDING  Incomplete  Blood Culture (routine x 2)     Status: None (Preliminary result)   Collection Time: 05/28/14  8:04 AM  Result Value Ref Range Status   Specimen Description BLOOD  Final   Special Requests BLOOD  Final   Culture NO GROWTH <24 HRS  Final   Report Status PENDING  Incomplete   Medical History: Past Medical History  Diagnosis Date  . Hypertension   . Atrial fibrillation     Onset well before 2004; Negative stress nuclear study in 01/2003  . Osteoporosis   . Acute colitis 09/2011    C. difficile negative  . Chronic anticoagulation   . Hyperlipidemia   . Hiatal hernia     by CT in 2013; also noted was an adrenal adenoma, duodenal lipoma right ovarian cyst, post hysterectomy  . Pneumonia 09/2011    09/2011  . Syncope   . Pericardial effusion   . Hypercalcemia   . Dementia    Anti-infectives    Start     Dose/Rate Route Frequency Ordered Stop   05/29/14 0900  ceFEPIme (MAXIPIME) 2 g in dextrose 5 % 50 mL IVPB     2 g 100 mL/hr over 30 Minutes Intravenous Every 24 hours 05/28/14 1329     05/28/14 0830  ceFEPIme (MAXIPIME) 2 g in dextrose 5 %  50 mL IVPB     2 g 100 mL/hr over 30 Minutes Intravenous  Once 05/28/14 0815 05/28/14 0902   05/28/14 0800  cefTRIAXone (ROCEPHIN) 2 g in dextrose 5 % 50 mL IVPB  Status:  Discontinued     2 g 100 mL/hr over 30 Minutes Intravenous  Once 05/28/14 0756 05/28/14 0815     Assessment: 81yo female admitted with suspected sepsis and reportedly had a positive blood culture from previous ED visit for UTI.  WBC is now improved.  Pt received Rocephin and Cefepime on admission.  SCr is at baseline.  Goal of Therapy:  Eradicate infection.  Plan:  Cefepime 2gm IV q24hrs Monitor labs, cultures, and progress.  Margo Aye, Dupree Givler A 05/28/2014,1:29 PM

## 2014-05-28 NOTE — ED Notes (Signed)
Family reports they were called and told to bring pt to ED for admission for sepsis.

## 2014-05-28 NOTE — Progress Notes (Signed)
PHARMACIST - PHYSICIAN COMMUNICATION  CONCERNING: P&T Medication Policy Regarding Oral Bisphosphonates  RECOMMENDATION: Your order for alendronate (Fosamax), ibandronate (Boniva), or risedronate (Actonel) has been discontinued at this time.  If the patient's post-hospital medical condition warrants safe use of this class of drugs, please resume the pre-hospital regimen upon discharge.  DESCRIPTION:  Alendronate (Fosamax), ibandronate (Boniva), and risedronate (Actonel) can cause severe esophageal erosions in patients who are unable to remain upright at least 30 minutes after taking this medication.   Since brief interruptions in therapy are thought to have minimal impact on bone mineral density, the Pharmacy & Therapeutics Committee has established that bisphosphonate orders should be routinely discontinued during hospitalization.   To override this safety policy and permit administration of Boniva, Fosamax, or Actonel in the hospital, prescribers must write "DO NOT HOLD" in the comments section when placing the order for this class of medications.  S. Carmelita Amparo, PharmD 

## 2014-05-28 NOTE — Progress Notes (Signed)
ANTICOAGULATION CONSULT NOTE - Initial Consult  Pharmacy Consult for Coumadin (chronic Rx PTA) Indication: atrial fibrillation  No Known Allergies  Patient Measurements: Height:  (177.8 cm) Weight: 139 lb (63.05 kg) IBW/kg (Calculated) : 68.5  Vital Signs: Temp: 97.6 F (36.4 C) (04/06 1114) Temp Source: Oral (04/06 1114) BP: 113/61 mmHg (04/06 1114) Pulse Rate: 67 (04/06 1114)  Labs:  Recent Labs  05/27/14 0529 05/28/14 0755  HGB 15.4* 14.7  HCT 45.9 44.1  PLT 165 130*  LABPROT  --  23.9*  INR  --  2.12*  CREATININE 0.75 0.66   Estimated Creatinine Clearance: 49.4 mL/min (by C-G formula based on Cr of 0.66).  Medical History: Past Medical History  Diagnosis Date  . Hypertension   . Atrial fibrillation     Onset well before 2004; Negative stress nuclear study in 01/2003  . Osteoporosis   . Acute colitis 09/2011    C. difficile negative  . Chronic anticoagulation   . Hyperlipidemia   . Hiatal hernia     by CT in 2013; also noted was an adrenal adenoma, duodenal lipoma right ovarian cyst, post hysterectomy  . Pneumonia 09/2011    09/2011  . Syncope   . Pericardial effusion   . Hypercalcemia   . Dementia     Medications:  Prescriptions prior to admission  Medication Sig Dispense Refill Last Dose  . acetaminophen (TYLENOL) 500 MG tablet Take 1,000 mg by mouth 2 (two) times daily.   05/28/2014 at Unknown time  . cephALEXin (KEFLEX) 500 MG capsule Take 1 capsule (500 mg total) by mouth 3 (three) times daily. 21 capsule 0 05/28/2014 at Unknown time  . diltiazem (CARDIZEM) 120 MG tablet Take 120 mg by mouth daily.   05/28/2014 at Unknown time  . docusate sodium 100 MG CAPS Take 100 mg by mouth 2 (two) times daily. 10 capsule 0 05/28/2014 at Unknown time  . ENSURE (ENSURE) Take 1 Can by mouth daily.   05/28/2014 at Unknown time  . escitalopram (LEXAPRO) 10 MG tablet Take 1 tablet (10 mg total) by mouth daily. 30 tablet 3 05/28/2014 at Unknown time  . levothyroxine  (SYNTHROID, LEVOTHROID) 100 MCG tablet Take 100 mcg by mouth daily before breakfast.   05/28/2014 at Unknown time  . memantine (NAMENDA) 10 MG tablet Take 10 mg by mouth 2 (two) times daily. Administered at 8am and 5pm   05/28/2014 at Unknown time  . omeprazole (PRILOSEC) 40 MG capsule Take 1 capsule (40 mg total) by mouth daily. For heartburn 30 capsule 3 05/28/2014 at Unknown time  . Polyethyl Glycol-Propyl Glycol (SYSTANE OP) Place 1 drop into both eyes 2 (two) times daily. 1 drop into both eyes   05/28/2014 at Unknown time  . polyethylene glycol (MIRALAX / GLYCOLAX) packet Take 17 g by mouth daily as needed for mild constipation.   unknown  . potassium chloride (K-DUR) 10 MEQ tablet Take 10 mEq by mouth daily.    05/28/2014 at Unknown time  . warfarin (COUMADIN) 5 MG tablet Take 2.5-5 mg by mouth daily. Take 1 tablet (5 mg) everyday except Friday. On Friday, take .5 tablet (2.5 mg)   05/27/2014 at Unknown time   Assessment: 81yo female on chronic Coumadin PTA for h/o afib.  INR is therapeutic today.  Home dose listed above.    Goal of Therapy:  INR 2-3 Monitor platelets by anticoagulation protocol: Yes   Plan:  Coumadin  today x 1 per home regimen INR daily  Valrie Hart  A 05/28/2014,12:54 PM

## 2014-05-28 NOTE — ED Notes (Signed)
Per note this am pt has positive blood culture.

## 2014-05-28 NOTE — ED Provider Notes (Signed)
CSN: [id number]     Arrival date & time 05/28/14  [id number] History   First MD Initiated Contact with Patient 05/28/14 [phone]     Chief Complaint  Patient presents with  . Abnormal Lab      The history is provided by the nursing home, a relative and the patient. The history is limited by the condition of the patient (Hx dementia).  Pt was seen at 0810. Per NH report, pt's family and pt:  Family states they were called today by NH staff and told to bring pt to the ED for "admission for sepsis." Pt's blood culture from 05/27/14 was +GNR. Pt's family states pt has had N/V/D and generalized weakness for the past 3 days. Has been associated with "fevers."  NH gave APAP this morning at 0700 for fever. Pt was seen in the ED yesterday s/p fall, dx UTI, and rx keflex. Pt herself has hx of dementia and currently denies any complaints. Denies CP/SOB, no abd pain.    Past Medical History  Diagnosis Date  . Hypertension   . Atrial fibrillation     Onset well before 2004; Negative stress nuclear study in 01/2003  . Osteoporosis   . Acute colitis 09/2011    C. difficile negative  . Chronic anticoagulation   . Hyperlipidemia   . Hiatal hernia     by CT in 2013; also noted was an adrenal adenoma, duodenal lipoma right ovarian cyst, post hysterectomy  . Pneumonia 09/2011    09/2011  . Syncope   . Pericardial effusion   . Hypercalcemia   . Dementia    Past Surgical History  Procedure Laterality Date  . Abdominal hysterectomy      No oophorectomy  . Appendectomy    . Colonoscopy  2005    Reportedly normal screening study   Family History  Problem Relation Age of Onset  . Heart attack Father   . Cancer Mother   . Hypertension Father    History  Substance Use Topics  . Smoking status: Never Smoker   . Smokeless tobacco: Never Used  . Alcohol Use: No   OB History    Gravida Para Term Preterm AB TAB SAB Ectopic Multiple Living   2 2 2             Review of Systems  Unable to perform ROS: Dementia       Allergies  Review of patient's allergies indicates no known allergies.  Home Medications   Prior to Admission medications   Medication Sig Start Date End Date Taking? Authorizing Provider  cephALEXin (KEFLEX) 500 MG capsule Take 1 capsule (500 mg total) by mouth 3 (three) times daily. 05/27/14   Geoffery Lyonsouglas Delo, MD  diltiazem (CARDIZEM) 120 MG tablet Take 120 mg by mouth daily. 02/06/13   Historical Provider, MD  docusate sodium 100 MG CAPS Take 100 mg by mouth 2 (two) times daily. 02/26/13   Tora KindredMarianne L York, PA-C  escitalopram (LEXAPRO) 10 MG tablet Take 1 tablet (10 mg total) by mouth daily. 05/14/12   Sharon SellerJessica K Eubanks, NP  levothyroxine (SYNTHROID, LEVOTHROID) 100 MCG tablet Take 100 mcg by mouth daily before breakfast.    Historical Provider, MD  memantine (NAMENDA) 10 MG tablet Take 10 mg by mouth 2 (two) times daily. Administered at 8am and 5pm    Historical Provider, MD  omeprazole (PRILOSEC) 40 MG capsule Take 1 capsule (40 mg total) by mouth daily. For heartburn 05/14/12   Sharon SellerJessica K Eubanks, NP  Polyethyl  Glycol-Propyl Glycol (SYSTANE OP) Apply to eye 2 (two) times daily. 1 drop into both eyes    Historical Provider, MD  potassium chloride (K-DUR) 10 MEQ tablet Take 10 mEq by mouth daily.  08/13/12   Historical Provider, MD   BP 99/62 mmHg  Pulse 56  Temp(Src) 101 F (38.3 C) (Rectal)  Resp 18  Ht  (1.778 m)  Wt 139 lb (63.05 kg)  BMI 19.94 kg/m2  SpO2 76%   Filed Vitals:   05/28/14 0741 05/28/14 0800 05/28/14 0830 05/28/14 0900  BP: 119/69 111/80 108/60 94/53  Pulse: 87 87 74 71  Temp: 101 F (38.3 C)     TempSrc: Rectal     Resp: Height:  (1.778 m)     Weight: 139 lb (63.05 kg)     SpO2: 96% 97% 95% 96%     Physical Exam  0815: Physical examination:  Nursing notes reviewed; Vital signs and O2 SAT reviewed;  Constitutional: Well developed, Well nourished, In no acute distress; Head:  Normocephalic, atraumatic; Eyes: EOMI, PERRL, No  scleral icterus; ENMT: Mouth and pharynx normal, Mucous membranes dry; Neck: Supple, Full range of motion, No lymphadenopathy; Cardiovascular: Irregular irregular rate and rhythm, No gallop; Respiratory: Breath sounds clear & equal bilaterally, No wheezes.  Speaking full sentences with ease, Normal respiratory effort/excursion; Chest: Nontender, Movement normal; Abdomen: Soft, Nontender, Nondistended, Normal bowel sounds; Genitourinary: No CVA tenderness; Extremities: Pulses normal, No tenderness, No edema, No calf edema or asymmetry.; Neuro: Awake, alert, confused re: events per hx dementia. Major CN grossly intact. No facial droop. Speech clear. Moves all extremities on stretcher spontaneously..; Skin: Color normal, Warm, Dry.   ED Course  Procedures     EKG Interpretation   Date/Time:  Wednesday May 28 2014 07:44:22 EDT Ventricular Rate:  93 PR Interval:    QRS Duration: 74 QT Interval:  336 QTC Calculation: 418 R Axis:   -23 Text Interpretation:  Atrial fibrillation Ventricular premature complex  Borderline left axis deviation Low voltage, extremity leads When compared  with ECG of 02/07/2013 No significant change was found Confirmed by  [facility]  MD, Nicholos Johns [phone]) on 05/28/2014 8:27:18 AM      MDM  MDM Reviewed: previous chart, nursing note and vitals Reviewed previous: labs, ECG and x-ray Interpretation: labs, ECG and x-ray Total time providing critical care: 30-74 minutes. This excludes time spent performing separately reportable procedures and services. Consults: admitting MD     CRITICAL CARE Performed by: Laray Anger Total critical care time: 35 Critical care time was exclusive of separately billable procedures and treating other patients. Critical care was necessary to treat or prevent imminent or life-threatening deterioration. Critical care was time spent personally by me on the following activities: development of treatment plan with patient and/or  surrogate as well as nursing, discussions with consultants, evaluation of patient's response to treatment, examination of patient, obtaining history from patient or surrogate, ordering and performing treatments and interventions, ordering and review of laboratory studies, ordering and review of radiographic studies, pulse oximetry and re-evaluation of patient's condition.  Results for orders placed or performed during the hospital encounter of 05/28/14  CBC WITH DIFFERENTIAL  Result Value Ref Range   WBC 6.1 4.0 - 10.5 K/uL   RBC 4.56 3.87 - 5.11 MIL/uL   Hemoglobin 14.7 12.0 - 15.0 g/dL   HCT 60.4 54.0 - 98.1 %   MCV 96.7 78.0 - 100.0 fL   MCH 32.2 26.0 - 34.0 pg  MCHC 33.3 30.0 - 36.0 g/dL   RDW 21.3 08.6 - 57.8 %   Platelets 130 (L) 150 - 400 K/uL   Neutrophils Relative % 82 (H) 43 - 77 %   Neutro Abs 5.0 1.7 - 7.7 K/uL   Lymphocytes Relative 11 (L) 12 - 46 %   Lymphs Abs 0.7 0.7 - 4.0 K/uL   Monocytes Relative 7 3 - 12 %   Monocytes Absolute 0.4 0.1 - 1.0 K/uL   Eosinophils Relative 0 0 - 5 %   Eosinophils Absolute 0.0 0.0 - 0.7 K/uL   Basophils Relative 0 0 - 1 %   Basophils Absolute 0.0 0.0 - 0.1 K/uL  Comprehensive metabolic panel  Result Value Ref Range   Sodium 140 135 - 145 mmol/L   Potassium 3.2 (L) 3.5 - 5.1 mmol/L   Chloride 106 96 - 112 mmol/L   CO2 25 19 - 32 mmol/L   Glucose, Bld 105 (H) 70 - 99 mg/dL   BUN 14 6 - 23 mg/dL   Creatinine, Ser 4.69 0.50 - 1.10 mg/dL   Calcium 9.3 8.4 - 62.9 mg/dL   Total Protein 6.5 6.0 - 8.3 g/dL   Albumin 3.5 3.5 - 5.2 g/dL   AST 43 (H) 0 - 37 U/L   ALT 18 0 - 35 U/L   Alkaline Phosphatase 67 39 - 117 U/L   Total Bilirubin 0.7 0.3 - 1.2 mg/dL   GFR calc non Af Amer 77 (L) >90 mL/min   GFR calc Af Amer 89 (L) >90 mL/min   Anion gap 9 5 - 15  Urinalysis, Routine w reflex microscopic  Result Value Ref Range   Color, Urine YELLOW YELLOW   APPearance CLEAR CLEAR   Specific Gravity, Urine 1.025 1.005 - 1.030   pH 6.0 5.0 -  8.0   Glucose, UA NEGATIVE NEGATIVE mg/dL   Hgb urine dipstick LARGE (A) NEGATIVE   Bilirubin Urine NEGATIVE NEGATIVE   Ketones, ur NEGATIVE NEGATIVE mg/dL   Protein, ur 30 (A) NEGATIVE mg/dL   Urobilinogen, UA 0.2 0.0 - 1.0 mg/dL   Nitrite POSITIVE (A) NEGATIVE   Leukocytes, UA TRACE (A) NEGATIVE  Protime-INR  Result Value Ref Range   Prothrombin Time 23.9 (H) 11.6 - 15.2 seconds   INR 2.12 (H) 0.00 - 1.49  Lipase, blood  Result Value Ref Range   Lipase 20 11 - 59 U/L  Urine microscopic-add on  Result Value Ref Range   WBC, UA TOO NUMEROUS TO COUNT <3 WBC/hpf   RBC / HPF TOO NUMEROUS TO COUNT <3 RBC/hpf   Bacteria, UA MANY (A) RARE  I-Stat CG4 Lactic Acid, ED (not at [facility])  Result Value Ref Range   Lactic Acid, Venous 1.52 0.5 - 2.0 mmol/L  I-stat troponin, ED (not at [facility])  Result Value Ref Range   Troponin i, poc 0.00 0.00 - 0.08 ng/mL   Comment 3           Dg Chest Port 1 View 05/28/2014   CLINICAL DATA:  Sepsis.  History of atrial fibrillation  EXAM: PORTABLE CHEST - 1 VIEW  COMPARISON:  March 06, 2013  FINDINGS: There is underlying emphysematous change. There is no edema or consolidation. Heart is mildly enlarged with pulmonary vascularity within normal limits. No adenopathy. No bone lesions.  IMPRESSION: Underlying emphysema. No edema or consolidation. Stable cardiomegaly.   Electronically Signed   By: Bretta Bang III M.D.   On: 05/28/2014 08:27    0915:  [facility]  1/2 drawn yesterday with +GNR. +UTI, UC pending; will dose IV cefepime. Pt orthostatic on VS; will dose judicious IVF.  Dx and testing d/w pt and family.  Questions answered.  Verb understanding, agreeable to admit. T/C to [facility]. Ardyth Harps, case discussed, including:  HPI, pertinent PM/SHx, VS/PE, dx testing, ED course and treatment:  Agreeable to admit, requests to write temporary orders, obtain medical bed to team AP1.   Samuel Jester, DO 05/31/14 1658

## 2014-05-28 NOTE — Progress Notes (Signed)
Pharmacy Note:  Cefepime per Protocol  Initial antibiotics for Cefepime 2gm ordered by EDP for sepsis.  Estimated Creatinine Clearance: 49.4 mL/min (by C-G formula based on Cr of 0.75).   No Known Allergies  Filed Vitals:   05/28/14 0741  BP: 119/69  Pulse: 87  Temp: 101 F (38.3 C)  Resp: 16    Anti-infectives    Start     Dose/Rate Route Frequency Ordered Stop   05/28/14 0830  ceFEPIme (MAXIPIME) 2 g in dextrose 5 % 50 mL IVPB     2 g 100 mL/hr over 30 Minutes Intravenous  Once 05/28/14 0815     05/28/14 0800  cefTRIAXone (ROCEPHIN) 2 g in dextrose 5 % 50 mL IVPB  Status:  Discontinued     2 g 100 mL/hr over 30 Minutes Intravenous  Once 05/28/14 0756 05/28/14 0815     Plan: Initial doses for Cefepime 2gm X 1 ordered to be given now Pt has orders for CEFTRIAXONE PROTOCOL AS WELL AS CEFEPIME PROTOCOL. F/U admission orders for further dosing and ABX selection.   Wayland DenisHall, Delonta Yohannes A, [facility] Lewes Surgery CenterRPH 05/28/2014 8:18 AM

## 2014-05-28 NOTE — H&P (Signed)
[facility] History and Physical  Tamara [name] UEA:[id number] DOB: 04-16-1924 DOA: 05/28/2014  Referring physician:  PCP: Catalina Pizza, MD   Chief Complaint:   HPI: Tamara [name] is a 81 y.o. female with a past medical history that includes hypertension, A. fib on Coumadin, colitis, hiatal hernia, mild dementia presents to the emergency department from assisted living with the chief complaint of positive blood culture. Information is obtained from the family who is at the bedside and reported that yesterday patient was in the emergency department with complaints of a one-day history of nausea vomiting diarrhea evaluation revealed a urinary tract infection. At that time she was given Keflex some IV fluids and discharged back to assisted living. Today family was informed of 1 positive blood culture by the facility and was requested to take patient to the emergency department. Still he also reported a fever during the night and was given Tylenol. Patient denies any chest pain palpitations abdominal pain nausea. She reports no vomiting or loose stool for a day. He denies headache dizziness syncope or near-syncope. She denies dysuria hematuria but does endorse some frequency. She reports feeling "weak"  Workup in the emergency department includes complete metabolic panel significant for a potassium of 3.2, complete blood count significant for platelets of 130, initial troponin 0.00 lactic acid 1.52, INR 2.12, urinalysis reveals many bacteria WBCs and RBCs too numerous to count trace leukocytes, history x-ray with stable cardiomegaly no infiltrate or consolidation. Upon arrival to the emergency department she had a rectal temp of 101 heart rate of 87. Blood pressure dropped to 94/53 and saturation level dropped to 76% as well. She was given IV fluids and antibiotics per sepsis protocol.  View of systems;  10 point review of systems completed and all systems are negative except as indicated in  the history of present illness   Past Medical History  Diagnosis Date  . Hypertension   . Atrial fibrillation     Onset well before 2004; Negative stress nuclear study in 01/2003  . Osteoporosis   . Acute colitis 09/2011    C. difficile negative  . Chronic anticoagulation   . Hyperlipidemia   . Hiatal hernia     by CT in 2013; also noted was an adrenal adenoma, duodenal lipoma right ovarian cyst, post hysterectomy  . Pneumonia 09/2011    09/2011  . Syncope   . Pericardial effusion   . Hypercalcemia   . Dementia    Past Surgical History  Procedure Laterality Date  . Abdominal hysterectomy      No oophorectomy  . Appendectomy    . Colonoscopy  2005    Reportedly normal screening study   Social History:  reports that she has never smoked. She has never used smokeless tobacco. She reports that she does not drink alcohol or use illicit drugs.  she is a resident of [facility] assisted [facility] she normally walks with a cane but reportedly recently facility has requested she use a walker as they care stress her at a higher risk of falling. No reported recent falls  No Known Allergies  Family History  Problem Relation Age of Onset  . Heart attack Father   . Cancer Mother   . Hypertension Father     Prior to Admission medications   Medication Sig Start Date End Date Taking? Authorizing Provider  acetaminophen (TYLENOL) 325 MG tablet Take 2 tablets (650 mg total) by mouth every 6 (six) hours as needed for mild pain (or Fever >/=  101). 02/26/13   Stephani Police, PA-C  alendronate (FOSAMAX) 70 MG tablet Take 70 mg by mouth once a week. Take with a full glass of water on an empty stomach.(on Sunday)    Historical Provider, MD  cephALEXin (KEFLEX) 500 MG capsule Take 1 capsule (500 mg total) by mouth 3 (three) times daily. 05/27/14   Geoffery Lyons, MD  diclofenac sodium (VOLTAREN) 1 % GEL Apply 4 g topically 3 (three) times daily as needed. pain    Historical Provider, MD  diltiazem  (CARDIZEM) 120 MG tablet Take 120 mg by mouth daily. 02/06/13   Historical Provider, MD  docusate sodium 100 MG CAPS Take 100 mg by mouth 2 (two) times daily. 02/26/13   Tora Kindred York, PA-C  escitalopram (LEXAPRO) 10 MG tablet Take 1 tablet (10 mg total) by mouth daily. 05/14/12   Sharon Seller, NP  HYDROcodone-acetaminophen (NORCO/VICODIN) 5-325 MG per tablet Take 1 tablet by mouth every 6 (six) hours as needed for moderate pain. 02/26/13   Kimber Relic, MD  levalbuterol Pauline Aus) 0.63 MG/3ML nebulizer solution Take 3 mLs (0.63 mg total) by nebulization every 6 (six) hours. 02/26/13   Stephani Police, PA-C  levothyroxine (SYNTHROID, LEVOTHROID) 100 MCG tablet Take 100 mcg by mouth daily before breakfast.    Historical Provider, MD  memantine (NAMENDA) 10 MG tablet Take 10 mg by mouth 2 (two) times daily. Administered at 8am and 5pm    Historical Provider, MD  omeprazole (PRILOSEC) 40 MG capsule Take 1 capsule (40 mg total) by mouth daily. For heartburn 05/14/12   Sharon Seller, NP  oseltamivir (TAMIFLU) 75 MG capsule Take 1 capsule (75 mg total) by mouth 2 (two) times daily. 02/26/13   Stephani Police, PA-C  Polyethyl Glycol-Propyl Glycol (SYSTANE OP) Apply to eye 2 (two) times daily. 1 drop into both eyes    Historical Provider, MD  potassium chloride (K-DUR) 10 MEQ tablet Take 10 mEq by mouth daily.  08/13/12   Historical Provider, MD  warfarin (COUMADIN) 3 MG tablet Take 4.5 mg by mouth See admin instructions. Take one and one-half tablet (4.5mg  total) on Tuesdays, Thursdays, Saturdays, and Sundays ONLY    Historical Provider, MD   Physical Exam: Filed Vitals:   05/28/14 0830 05/28/14 0900 05/28/14 0930 05/28/14 1000  BP: 108/60 94/53 107/58 99/62  Pulse: 74 71 69 56  Temp:      TempSrc:      Resp: Height:      Weight:      SpO2: 95% 96% 97% 76%    Wt Readings from Last 3 Encounters:  05/28/14 63.05 kg (139 lb)  02/24/13 71.2 kg (156 lb 15.5 oz)  08/16/12 68.856 kg (151  lb 12.8 oz)    General:  somewhat thin and frail but comfortable  Eyes: PERRL, normal lids, irises & conjunctiva ENT: grossly normal hearing, lips & tongue his membranes of her mouth are slightly pale and dry  Neck: no LAD, masses or thyromegaly Cardiovascular: irregularly irregular no murmur no gallop no rub no lower extremity edema. Respiratory: CTA bilaterally, no w/r/r. Normal respiratory effort. Abdomen: soft, ntnd positive bowel sounds but somewhat sluggish nontender to palpation  Skin: no rash or induration seen on limited exam Musculoskeletal: grossly normal tone BUE/BLE Psychiatric: grossly normal mood and affect, speech fluent and appropriate Neurologic: grossly non-focal. She is lethargic but easily aroused she follows commands responds appropriately to questions speech is slow but clear  Labs on Admission:  Basic Metabolic Panel:  Recent Labs Lab 05/27/14 0529 05/28/14 0755  NA 139 140  K 3.4* 3.2*  CL 104 106  CO2 26 25  GLUCOSE 127* 105*  BUN 17 14  CREATININE 0.75 0.66  CALCIUM 9.8 9.3   Liver Function Tests:  Recent Labs Lab 05/27/14 0529 05/28/14 0755  AST 39* 43*  ALT 17 18  ALKPHOS 84 67  BILITOT 1.2 0.7  PROT 7.5 6.5  ALBUMIN 4.2 3.5    Recent Labs Lab 05/28/14 0755  LIPASE 20   No results for input(s): AMMONIA in the last 168 hours. CBC:  Recent Labs Lab 05/27/14 0529 05/28/14 0755  WBC 15.4* 6.1  NEUTROABS 13.9* 5.0  HGB 15.4* 14.7  HCT 45.9 44.1  MCV 97.7 96.7  PLT 165 130*   Cardiac Enzymes: No results for input(s): CKTOTAL, CKMB, CKMBINDEX, TROPONINI in the last 168 hours.  BNP (last 3 results) No results for input(s): BNP in the last 8760 hours.  ProBNP (last 3 results) No results for input(s): PROBNP in the last 8760 hours.  CBG: No results for input(s): GLUCAP in the last 168 hours.  Radiological Exams on Admission: Dg Lumbar Spine 2-3 Views  05/27/2014   CLINICAL DATA:  Patient fell out of bed.  Low  back pain.  EXAM: LUMBAR SPINE - 2-3 VIEW  COMPARISON:  03/06/2013  FINDINGS: Diffuse bone demineralization. Normal alignment of the lumbar spine. Anterior compression of T12 vertebra representing about 70% maximal loss of height anteriorly. This demonstrates progression since previous study. Diffuse degenerative change throughout the lumbar spine with narrowed interspaces and prominent endplate hypertrophic changes. Vascular calcifications.  IMPRESSION: Progression of anterior compression of T12 since prior study. Diffuse degenerative changes in the lumbar spine.   Electronically Signed   By: Burman NievesWilliam  Stevens M.D.   On: 05/27/2014 06:21   Dg Pelvis 1-2 Views  05/27/2014   CLINICAL DATA:  Patient fell out of bed.  Low back pain.  EXAM: PELVIS - 1-2 VIEW  COMPARISON:  None.  FINDINGS: Degenerative changes in the lower lumbar spine and hips. Pelvis appears intact. No evidence of acute fracture or dislocation. SI joints and symphysis pubis are not displaced.  IMPRESSION: Degenerative changes in the lumbar spine and hips. No displaced fractures identified.   Electronically Signed   By: Burman NievesWilliam  Stevens M.D.   On: 05/27/2014 06:22   Dg Chest Port 1 View  05/28/2014   CLINICAL DATA:  Sepsis.  History of atrial fibrillation  EXAM: PORTABLE CHEST - 1 VIEW  COMPARISON:  March 06, 2013  FINDINGS: There is underlying emphysematous change. There is no edema or consolidation. Heart is mildly enlarged with pulmonary vascularity within normal limits. No adenopathy. No bone lesions.  IMPRESSION: Underlying emphysema. No edema or consolidation. Stable cardiomegaly.   Electronically Signed   By: Bretta BangWilliam  Woodruff III M.D.   On: 05/28/2014 08:27    EKG:   Assessment/Plan Principal Problem:   Sepsis: Likely urinary tract infection some concern for 1 positive blood culture. Will admit will follow sepsis protocol and continue antibiotics initiated in the emergency department. Will provide supportive therapy in the form of  Tylenol for any fevers and IV fluids. Await urine culture. Active Problems: Urinary  tract infection. See #1. Await urine culture.  Gram-negative bacteremi: One out of 2 blood cultures positive. See #1.    Atrial fibrillation: Rate controlled. She is on Coumadin at home. Will continue this performance he. INR 2.1 on admission. In addition  she is on Cardizem which I will hold for now because her blood pressures a little soft. Monitor closely and resume as indicated.    Hypertension: Would pressure on the low end of normal at the time of my exam. Will hold her Cardizem for now and monitor closely.    Chronic anticoagulation: On Coumadin. Will request pharmacy consult to continue    Degenerative joint disease: Appears stable at baseline continue home medicines    Hypokalemia: Chart review indicates tendency towards hypokalemia. Current levels 3.2. Will continue her home supplement and give her an extra dose. Will check her magnesium level. Will monitor       Code Status: limited DVT Prophylaxis: Family Communication: sone and daughter in law at bedside Disposition Plan: back to facility  Time spent: 60 minutes  [facility] Pager [phone]

## 2014-05-29 DIAGNOSIS — Z7901 Long term (current) use of anticoagulants: Secondary | ICD-10-CM

## 2014-05-29 DIAGNOSIS — A4151 Sepsis due to Escherichia coli [E. coli]: Principal | ICD-10-CM

## 2014-05-29 LAB — COMPREHENSIVE METABOLIC PANEL
ALK PHOS: 59 U/L (ref 39–117)
ALT: 19 U/L (ref 0–35)
ANION GAP: 8 (ref 5–15)
AST: 33 U/L (ref 0–37)
Albumin: 3.1 g/dL — ABNORMAL LOW (ref 3.5–5.2)
BUN: 10 mg/dL (ref 6–23)
CALCIUM: 8.7 mg/dL (ref 8.4–10.5)
CO2: 27 mmol/L (ref 19–32)
CREATININE: 0.58 mg/dL (ref 0.50–1.10)
Chloride: 104 mmol/L (ref 96–112)
GFR calc non Af Amer: 81 mL/min — ABNORMAL LOW (ref >90)
Glucose, Bld: 89 mg/dL (ref 70–99)
Potassium: 3.2 mmol/L — ABNORMAL LOW (ref 3.5–5.1)
SODIUM: 139 mmol/L (ref 135–145)
Total Bilirubin: 0.6 mg/dL (ref 0.3–1.2)
Total Protein: 5.8 g/dL — ABNORMAL LOW (ref 6.0–8.3)

## 2014-05-29 LAB — CBC
HCT: 37.8 % (ref 36.0–46.0)
Hemoglobin: 12.6 g/dL (ref 12.0–15.0)
MCH: 32.4 pg (ref 26.0–34.0)
MCHC: 33.3 g/dL (ref 30.0–36.0)
MCV: 97.2 fL (ref 78.0–100.0)
Platelets: 125 10*3/uL — ABNORMAL LOW (ref 150–400)
RBC: 3.89 MIL/uL (ref 3.87–5.11)
RDW: 13.1 % (ref 11.5–15.5)
WBC: 6.2 10*3/uL (ref 4.0–10.5)

## 2014-05-29 LAB — PROTIME-INR
INR: 2.66 — ABNORMAL HIGH (ref 0.00–1.49)
Prothrombin Time: 28.6 seconds — ABNORMAL HIGH (ref 11.6–15.2)

## 2014-05-29 LAB — URINE CULTURE
Colony Count: NO GROWTH
Culture: NO GROWTH

## 2014-05-29 MED ORDER — POTASSIUM CHLORIDE CRYS ER 20 MEQ PO TBCR
40.0000 meq | EXTENDED_RELEASE_TABLET | ORAL | Status: AC
  Administered 2014-05-29: 40 meq via ORAL
  Filled 2014-05-29: qty 2

## 2014-05-29 MED ORDER — WARFARIN SODIUM 5 MG PO TABS
5.0000 mg | ORAL_TABLET | Freq: Once | ORAL | Status: AC
  Administered 2014-05-29: 5 mg via ORAL
  Filled 2014-05-29: qty 1

## 2014-05-29 MED ORDER — MAGNESIUM OXIDE 400 (241.3 MG) MG PO TABS
400.0000 mg | ORAL_TABLET | Freq: Three times a day (TID) | ORAL | Status: DC
  Administered 2014-05-29 – 2014-05-30 (×3): 400 mg via ORAL
  Filled 2014-05-29 (×3): qty 1

## 2014-05-29 NOTE — Progress Notes (Signed)
[facility] PROGRESS NOTE  Tamara HaggardBetty S [name] BJY:[id number] DOB: 09/06/1924 DOA: 05/28/2014 PCP: Catalina PizzaHALL, ZACH, MD  Assessment/Plan: Sepsis -Sepsis parameters improved. -Secondary to Escherichia coli UTI and bacteremia. -Continue cefepime pending sensitivities.  Escherichia coli UTI and septicemia -As above.  Atrial fibrillation -Currently rate controlled. -Chronically anticoagulated on Coumadin.  Hypokalemia/hypomagnesemia -Replete orally.  Code Status: Partial code Family Communication: Patient only  Disposition Plan: Back to ALF when medically ready, anticipate 24 hours.   Consultants:  None   Antibiotics:  Cefepime   Subjective: No change from yesterday. Still feels weak.  Objective: Filed Vitals:   05/28/14 1114 05/28/14 1426 05/28/14 2209 05/29/14 0546  BP: 113/61 114/67 164/92 151/87  Pulse: 67 76 73 89  Temp: 97.6 F (36.4 C) 98.5 F (36.9 C) 97.8 F (36.6 C) 99.1 F (37.3 C)  TempSrc: Oral Oral Oral Oral  Resp: 18 18 18 15   Height:      Weight:      SpO2: 100% 99% 99% 97%    Intake/Output Summary (Last 24 hours) at 05/29/14 1427 Last data filed at 05/29/14 1331  Gross per 24 hour  Intake 2006.25 ml  Output      0 ml  Net 2006.25 ml   Filed Weights   05/28/14 0741  Weight: 63.05 kg (139 lb)    Exam:   General:  Awake, alert, no current distress  Cardiovascular: Irregular, no murmurs, rubs or gallops  Respiratory: Clear to auscultation bilaterally  Abdomen: Soft, nontender, nondistended, positive bowel sounds  Extremities: No clubbing, cyanosis or edema, positive pulses   Neurologic:  Grossly intact and nonfocal  Data Reviewed: Basic Metabolic Panel:  Recent Labs Lab 05/27/14 0529 05/28/14 0753 05/28/14 0755 05/29/14 0617  NA 139  --  140 139  K 3.4*  --  3.2* 3.2*  CL 104  --  106 104  CO2 26  --  25 27  GLUCOSE 127*  --  105* 89  BUN 17  --  14 10  CREATININE 0.75  --  0.66 0.58  CALCIUM 9.8  --  9.3  8.7  MG  --  1.5  --   --    Liver Function Tests:  Recent Labs Lab 05/27/14 0529 05/28/14 0755 05/29/14 0617  AST 39* 43* 33  ALT 17 18 19   ALKPHOS 84 67 59  BILITOT 1.2 0.7 0.6  PROT 7.5 6.5 5.8*  ALBUMIN 4.2 3.5 3.1*    Recent Labs Lab 05/28/14 0755  LIPASE 20   No results for input(s): AMMONIA in the last 168 hours. CBC:  Recent Labs Lab 05/27/14 0529 05/28/14 0755 05/29/14 0617  WBC 15.4* 6.1 6.2  NEUTROABS 13.9* 5.0  --   HGB 15.4* 14.7 12.6  HCT 45.9 44.1 37.8  MCV 97.7 96.7 97.2  PLT 165 130* 125*   Cardiac Enzymes: No results for input(s): CKTOTAL, CKMB, CKMBINDEX, TROPONINI in the last 168 hours. BNP (last 3 results) No results for input(s): BNP in the last 8760 hours.  ProBNP (last 3 results) No results for input(s): PROBNP in the last 8760 hours.  CBG: No results for input(s): GLUCAP in the last 168 hours.  Recent Results (from the past 240 hour(s))  Blood culture (routine x 2)     Status: None (Preliminary result)   Collection Time: 05/27/14  5:29 AM  Result Value Ref Range Status   Specimen Description BLOOD RIGHT ANTECUBITAL DRAWN BY RN YW  Final   Special Requests BOTTLES DRAWN AEROBIC AND ANAEROBIC  [facility] EACH  Final   Culture   Final    ESCHERICHIA COLI Note: Gram Stain Report Called to,Read Back By and Verified With: FLOW MANAGER NEWMAN 0200 07/28/2014 BY FORSYTH K Performed at [facility] Performed at [facility]    Report Status PENDING  Incomplete  Blood culture (routine x 2)     Status: None (Preliminary result)   Collection Time: 05/27/14  5:40 AM  Result Value Ref Range Status   Specimen Description BLOOD LEFT HAND  Final   Special Requests   Final    BOTTLES DRAWN AEROBIC AND ANAEROBIC AEB=6CC ANA=4CC   Culture NO GROWTH 2 DAYS  Final   Report Status PENDING  Incomplete  Urine culture     Status: None (Preliminary result)   Collection Time: 05/27/14  6:38 AM  Result Value Ref Range Status   Specimen  Description URINE, CLEAN CATCH  Final   Special Requests NONE  Final   Colony Count   Final    >=100,000 COLONIES/ML Performed at [facility]    Culture   Final    ESCHERICHIA COLI Performed at [facility]    Report Status PENDING  Incomplete  Blood Culture (routine x 2)     Status: None (Preliminary result)   Collection Time: 05/28/14  8:02 AM  Result Value Ref Range Status   Specimen Description BLOOD PICC LINE DRAWN BY RN  Final   Special Requests   Final    BOTTLES DRAWN AEROBIC AND ANAEROBIC AEB=8CC ANA=6CC   Culture NO GROWTH 1 DAY  Final   Report Status PENDING  Incomplete  Blood Culture (routine x 2)     Status: None (Preliminary result)   Collection Time: 05/28/14  8:04 AM  Result Value Ref Range Status   Specimen Description BLOOD RIGHT HAND  Final   Special Requests BOTTLES DRAWN AEROBIC AND ANAEROBIC 6CC  Final   Culture NO GROWTH 1 DAY  Final   Report Status PENDING  Incomplete     Studies: Dg Chest Port 1 View  05/28/2014   CLINICAL DATA:  Sepsis.  History of atrial fibrillation  EXAM: PORTABLE CHEST - 1 VIEW  COMPARISON:  March 06, 2013  FINDINGS: There is underlying emphysematous change. There is no edema or consolidation. Heart is mildly enlarged with pulmonary vascularity within normal limits. No adenopathy. No bone lesions.  IMPRESSION: Underlying emphysema. No edema or consolidation. Stable cardiomegaly.   Electronically Signed   By: Bretta Bang III M.D.   On: 05/28/2014 08:27    Scheduled Meds: . ceFEPime (MAXIPIME) IV  2 g Intravenous Q24H  . escitalopram  10 mg Oral Daily  . levothyroxine  100 mcg Oral QAC breakfast  . memantine  10 mg Oral BID  . pantoprazole  40 mg Oral Daily  . polyvinyl alcohol  1 drop Both Eyes BID  . potassium chloride  10 mEq Oral Daily  . warfarin  5 mg Oral Once  . Warfarin - Pharmacist Dosing Inpatient   Does not apply Q24H   Continuous Infusions: . sodium chloride 75 mL/hr at 05/28/14 1026     Principal Problem:   Sepsis Active Problems:   Atrial fibrillation   Hypertension   Chronic anticoagulation   Degenerative joint disease   Hypokalemia   Gram-negative bacteremia   UTI (lower urinary tract infection)    Time spent: 25 minutes Greater than 50% of this time was spent in direct contact with the patient coordinating care.  Chaya Jan  [facility] Pager [phone]  If 7PM-7AM, please contact night-coverage at [email or web address], password [facility] 05/29/2014, 2:27 PM  LOS: 1 day

## 2014-05-29 NOTE — Care Management Note (Addendum)
    Page 1 of 1   05/30/2014     4:04:22 PM CARE MANAGEMENT NOTE 05/30/2014  Patient:  Norville HaggardCUNNINGHAM,Dustine S   Account Number:  [id number]  Date Initiated:  05/29/2014  Documentation initiated by:  Sharrie RothmanBLACKWELL,Madisin Hasan C  Subjective/Objective Assessment:   [facility]. Pt will return to facility at discharge.     Action/Plan:   CSW is aware and will arrange discharge to facility when medically stable.   Anticipated DC Date:  05/31/2014   Anticipated DC Plan:  ASSISTED LIVING / REST HOME  In-house referral  Clinical Social Worker      DC Planning Services  CM consult      Choice offered to / List presented to:             Status of service:  Completed, signed off Medicare Important Message given?  NA - LOS <3 / Initial given by admissions (If response is "NO", the following Medicare IM given date fields will be blank) Date Medicare IM given:   Medicare IM given by:   Date Additional Medicare IM given:   Additional Medicare IM given by:    Discharge Disposition:  SKILLED NURSING FACILITY  Per UR Regulation:    If discussed at Long Length of Stay Meetings, dates discussed:    Comments:  05/30/14 1520 Arlyss Queenammy Carrolyn Hilmes, RN BSN Cm Pts daughter in law feels pt needs higher level of care. PT eval recommended SNF. CSW made aware and pt discharging to [facility] today. CSW to arrange discharge to facility.  05/30/14 0945 Arlyss Queenammy Mekaylah Klich, RN BSN CM Pt [facility]. CSW to arrange discharge to facility.  05/29/14 1040 Arlyss Queenammy Arrington Bencomo, RN BSN CM

## 2014-05-29 NOTE — Progress Notes (Signed)
ANTICOAGULATION CONSULT NOTE - follow up  Pharmacy Consult for Coumadin (chronic Rx PTA) Indication: atrial fibrillation  No Known Allergies  Patient Measurements: Height: 5\' 10"  (177.8 cm) Weight: 139 lb (63.05 kg) IBW/kg (Calculated) : 68.5  Vital Signs: Temp: 99.1 F (37.3 C) (04/07 0546) Temp Source: Oral (04/07 0546) BP: 151/87 mmHg (04/07 0546) Pulse Rate: 89 (04/07 0546)  Labs:  Recent Labs  05/27/14 0529 05/28/14 0755 05/29/14 0617  HGB 15.4* 14.7 12.6  HCT 45.9 44.1 37.8  PLT 165 130* 125*  LABPROT  --  23.9* 28.6*  INR  --  2.12* 2.66*  CREATININE 0.75 0.66 0.58   Estimated Creatinine Clearance: 49.4 mL/min (by C-G formula based on Cr of 0.58).  Medical History: Past Medical History  Diagnosis Date  . Hypertension   . Atrial fibrillation     Onset well before 2004; Negative stress nuclear study in 01/2003  . Osteoporosis   . Acute colitis 09/2011    C. difficile negative  . Chronic anticoagulation   . Hyperlipidemia   . Hiatal hernia     by CT in 2013; also noted was an adrenal adenoma, duodenal lipoma right ovarian cyst, post hysterectomy  . Pneumonia 09/2011    09/2011  . Syncope   . Pericardial effusion   . Hypercalcemia   . Dementia    Medications:  Prescriptions prior to admission  Medication Sig Dispense Refill Last Dose  . acetaminophen (TYLENOL) 500 MG tablet Take 1,000 mg by mouth 2 (two) times daily.   05/28/2014 at Unknown time  . cephALEXin (KEFLEX) 500 MG capsule Take 1 capsule (500 mg total) by mouth 3 (three) times daily. 21 capsule 0 05/28/2014 at Unknown time  . diltiazem (CARDIZEM) 120 MG tablet Take 120 mg by mouth daily.   05/28/2014 at Unknown time  . docusate sodium 100 MG CAPS Take 100 mg by mouth 2 (two) times daily. 10 capsule 0 05/28/2014 at Unknown time  . ENSURE (ENSURE) Take 1 Can by mouth daily.   05/28/2014 at Unknown time  . escitalopram (LEXAPRO) 10 MG tablet Take 1 tablet (10 mg total) by mouth daily. 30 tablet 3 05/28/2014  at Unknown time  . levothyroxine (SYNTHROID, LEVOTHROID) 100 MCG tablet Take 100 mcg by mouth daily before breakfast.   05/28/2014 at Unknown time  . memantine (NAMENDA) 10 MG tablet Take 10 mg by mouth 2 (two) times daily. Administered at 8am and 5pm   05/28/2014 at Unknown time  . omeprazole (PRILOSEC) 40 MG capsule Take 1 capsule (40 mg total) by mouth daily. For heartburn 30 capsule 3 05/28/2014 at Unknown time  . Polyethyl Glycol-Propyl Glycol (SYSTANE OP) Place 1 drop into both eyes 2 (two) times daily. 1 drop into both eyes   05/28/2014 at Unknown time  . polyethylene glycol (MIRALAX / GLYCOLAX) packet Take 17 g by mouth daily as needed for mild constipation.   unknown  . potassium chloride (K-DUR) 10 MEQ tablet Take 10 mEq by mouth daily.    05/28/2014 at Unknown time  . warfarin (COUMADIN) 5 MG tablet Take 2.5-5 mg by mouth daily. Take 1 tablet (5 mg) everyday except Friday. On Friday, take .5 tablet (2.5 mg)   05/27/2014 at Unknown time   Assessment: 81yo female on chronic Coumadin PTA for h/o afib.  INR is therapeutic today.  Home dose listed above.    Goal of Therapy:  INR 2-3 Monitor platelets by anticoagulation protocol: Yes   Plan:  Coumadin 5mg  today x 1 per  home regimen INR daily  Tamara [name] A 05/29/2014,11:27 AM

## 2014-05-29 NOTE — Clinical Social Work Psychosocial (Signed)
Clinical Social Work Department BRIEF PSYCHOSOCIAL ASSESSMENT 05/29/2014  Patient:  Tamara [name], Tamara [name]     Account Number:  [id number]     Admit date:  05/28/2014  Clinical Social Worker:  Legrand Como  Date/Time:  05/29/2014 11:35 AM  Referred by:  CSW  Date Referred:  05/29/2014 Referred for  ALF Placement   Other Referral:   Interview type:  Patient Other interview type:    PSYCHOSOCIAL DATA Living Status:  FACILITY Admitted from facility:  [facility] Level of care:  Assisted Living Primary support name:  [name] Primary support relationship to patient:  CHILD, ADULT Degree of support available:   Patient's son and daughter (who live in [place]) are very supportive. She has another son who lives in [place] and he also visits when he is in town.    CURRENT CONCERNS Current Concerns  Post-Acute Placement   Other Concerns:    SOCIAL WORK ASSESSMENT / PLAN CSW met with patient who was oriented to self and place. She was not fully oriented to time as she did not know the date.  Patient stated that she had been a resident at [facility] for "a long time." She stated that she uses a walker to ambulate and receives assistance with ADLs. Patient stated that her family is supportive and visit with her frequently. She stated that she initially moved to [place] five years ago to be closer to her son, [name], as she aged.  She stated that when she arrived in [place] she resided in her own apartment, drove herself and took care of her apartment.  She stated that she had to go to the assisted living facility because "things kept getting worse and worse and my kids took my driving away from me." CSW validated patient's feelings regarding her loss of independence.  Patient reported that she enjoys living at [facility] and ha a lot of friends there. She stated "they're good to me" referring to the staff at [facility].  She indicated that she desired to  return to the facility upon discharge.  CSW spoke with [name] at [facility].  [name] indicated that patient has been a resident at the facility for almost two years.  She indicated that patient's son and daughter in law visit her frequently.  She confirmed patient's statements.  She indicated that patient can come back to the facility upon discharge.   Assessment/plan status:  No Further Intervention Required Other assessment/ plan:   Information/referral to community resources:    PATIENT'S/FAMILY'S RESPONSE TO PLAN OF CARE: Patient and facility both agreeable for patient to return to the facilty upon discharge.   Ambrose Pancoast, [place]

## 2014-05-29 NOTE — Progress Notes (Signed)
UR chart review completed.  

## 2014-05-30 ENCOUNTER — Inpatient Hospital Stay
Admission: RE | Admit: 2014-05-30 | Discharge: 2014-06-20 | Disposition: A | Discharge status: Home / Self Care | Payer: Medicare PPO | Source: Ambulatory Visit | Attending: Internal Medicine | Admitting: Internal Medicine

## 2014-05-30 DIAGNOSIS — A419 Sepsis, unspecified organism: Secondary | ICD-10-CM | POA: Diagnosis not present

## 2014-05-30 DIAGNOSIS — I10 Essential (primary) hypertension: Secondary | ICD-10-CM | POA: Diagnosis not present

## 2014-05-30 DIAGNOSIS — A4150 Gram-negative sepsis, unspecified: Secondary | ICD-10-CM | POA: Diagnosis not present

## 2014-05-30 DIAGNOSIS — E876 Hypokalemia: Secondary | ICD-10-CM | POA: Diagnosis not present

## 2014-05-30 DIAGNOSIS — M2580 Other specified joint disorders, unspecified joint: Secondary | ICD-10-CM | POA: Diagnosis not present

## 2014-05-30 DIAGNOSIS — R7881 Bacteremia: Secondary | ICD-10-CM | POA: Diagnosis not present

## 2014-05-30 DIAGNOSIS — R6521 Severe sepsis with septic shock: Secondary | ICD-10-CM | POA: Diagnosis not present

## 2014-05-30 DIAGNOSIS — M6281 Muscle weakness (generalized): Secondary | ICD-10-CM | POA: Diagnosis not present

## 2014-05-30 DIAGNOSIS — E785 Hyperlipidemia, unspecified: Secondary | ICD-10-CM | POA: Diagnosis not present

## 2014-05-30 DIAGNOSIS — B9620 Unspecified Escherichia coli [E. coli] as the cause of diseases classified elsewhere: Secondary | ICD-10-CM | POA: Diagnosis not present

## 2014-05-30 DIAGNOSIS — R262 Difficulty in walking, not elsewhere classified: Secondary | ICD-10-CM | POA: Diagnosis not present

## 2014-05-30 DIAGNOSIS — N390 Urinary tract infection, site not specified: Secondary | ICD-10-CM | POA: Diagnosis not present

## 2014-05-30 DIAGNOSIS — J40 Bronchitis, not specified as acute or chronic: Secondary | ICD-10-CM | POA: Diagnosis not present

## 2014-05-30 DIAGNOSIS — M545 Low back pain: Secondary | ICD-10-CM | POA: Diagnosis not present

## 2014-05-30 DIAGNOSIS — I482 Chronic atrial fibrillation: Secondary | ICD-10-CM | POA: Diagnosis not present

## 2014-05-30 DIAGNOSIS — A4151 Sepsis due to Escherichia coli [E. coli]: Secondary | ICD-10-CM | POA: Diagnosis not present

## 2014-05-30 LAB — BASIC METABOLIC PANEL
ANION GAP: 9 (ref 5–15)
BUN: 10 mg/dL (ref 6–23)
CHLORIDE: 105 mmol/L (ref 96–112)
CO2: 26 mmol/L (ref 19–32)
Calcium: 8.8 mg/dL (ref 8.4–10.5)
Creatinine, Ser: 0.55 mg/dL (ref 0.50–1.10)
GFR calc Af Amer: >90 mL/min (ref >90)
GFR, EST NON AFRICAN AMERICAN: 82 mL/min — AB (ref >90)
Glucose, Bld: 95 mg/dL (ref 70–99)
Potassium: 3.2 mmol/L — ABNORMAL LOW (ref 3.5–5.1)
Sodium: 140 mmol/L (ref 135–145)

## 2014-05-30 LAB — PROTIME-INR
INR: 3.64 — AB (ref 0.00–1.49)
Prothrombin Time: 36.5 seconds — ABNORMAL HIGH (ref 11.6–15.2)

## 2014-05-30 LAB — CBC
HCT: 38.2 % (ref 36.0–46.0)
Hemoglobin: 12.7 g/dL (ref 12.0–15.0)
MCH: 31.6 pg (ref 26.0–34.0)
MCHC: 33.2 g/dL (ref 30.0–36.0)
MCV: 95 fL (ref 78.0–100.0)
PLATELETS: 133 10*3/uL — AB (ref 150–400)
RBC: 4.02 MIL/uL (ref 3.87–5.11)
RDW: 12.7 % (ref 11.5–15.5)
WBC: 7.2 10*3/uL (ref 4.0–10.5)

## 2014-05-30 LAB — URINE CULTURE

## 2014-05-30 LAB — CULTURE, BLOOD (ROUTINE X 2)

## 2014-05-30 MED ORDER — SULFAMETHOXAZOLE-TRIMETHOPRIM 800-160 MG PO TABS: 1.0000 | ORAL_TABLET | Freq: Two times a day (BID) | ORAL | Status: DC

## 2014-05-30 MED ORDER — MAGNESIUM OXIDE 400 (241.3 MG) MG PO TABS: 400.0000 mg | ORAL_TABLET | Freq: Three times a day (TID) | ORAL | Status: DC

## 2014-05-30 NOTE — Progress Notes (Signed)
NURSING PROGRESS NOTE  Norville HaggardBetty S Chenevert [id number] Discharge Data: 05/30/2014 4:03 PM Attending Provider: No att. providers found BJY:NWGNPCP:[facility], [facility], [facility]   Norville HaggardBetty S Dusenbery to be D/C'd Skilled nursing facility per MD order.    All IV's discontinued and monitored for bleeding.  All belongings returned to patient for patient to take home.  Report called to [facility]Karen [facility].   Patient left floor via wheelchair, escorted by NT.  Last Documented Vital Signs:  Blood pressure 127/77, pulse 82, temperature 97.5 F (36.4 C), temperature source Oral, resp. rate 16, height 5\' 10"  (1.778 m), weight 63.05 kg (139 lb), SpO2 95 %.  Mertha BaarsHorine, Conrado Nance D

## 2014-05-30 NOTE — Clinical Social Work Placement (Signed)
Clinical Social Work Department CLINICAL SOCIAL WORK PLACEMENT NOTE 05/30/2014  Patient:  Tamara [name],Tamara [name]  Account Number:  [id number] Admit date:  05/28/2014  Clinical Social Worker:  Derenda FennelKARA Anel Purohit, LCSW  Date/time:  05/30/2014 01:59 PM  Clinical Social Work is seeking post-discharge placement for this patient at the following level of care:   SKILLED NURSING   (*CSW will update this form in Epic as items are completed)   05/30/2014  Patient/family provided with [facility] Department of Clinical Social Work'[name] list of facilities offering this level of care within the geographic area requested by the patient (or if unable, by the patient'[name]).  05/30/2014  Patient/family informed of their freedom to choose among providers that offer the needed level of care, that participate in Medicare, Medicaid or managed care program needed by the patient, have an available bed and are willing to accept the patient.  05/30/2014  Patient/family informed of [facility]' ownership interest in [facility], as well as of the fact that they are under no obligation to receive care at this facility.  PASARR submitted to EDS on  PASARR number received on   FL2 transmitted to all facilities in geographic area requested by pt/family on  05/30/2014 FL2 transmitted to all facilities within larger geographic area on   Patient informed that his/her managed care company has contracts with or will negotiate with  certain facilities, including the following:     Patient/family informed of bed offers received:  05/30/2014 Patient chooses bed at [facility] Physician recommends and patient chooses bed at    Patient to be transferred to [facility] on  05/30/2014 Patient to be transferred to facility by staff Patient and family notified of transfer on 05/30/2014 Name of family member notified:  Dewayne HatchAnn  The following physician request were entered in Epic:   Additional Comments: Pt  has existing pasarr.  Derenda FennelKara Kaylyne Axton, [place] [phone]

## 2014-05-30 NOTE — Clinical Social Work Note (Signed)
Pt's daughter-in-law, Dewayne Hatchnn came to hospital and felt that pt required SNF. PT evaluated pt and agree. CSW discussed this with pt and Dewayne HatchAnn and provided SNF list. They request [facility] only. Facility notified and can accept pt today. [facility] will begin insurance authorization, but pt will be private pay in meantime. Pt and Ann aware and will pay upfront today. Pt will transfer with staff. MD notified as well as [facility] of change in plans.   Derenda FennelKara Jarious Lyon, [place] [phone]

## 2014-05-30 NOTE — Clinical Social Work Note (Signed)
CSW facilitated discharge.  CSW notified facility that patient was being discharged.  [name] that they would pick patient up at 2.  CSW spoke with Tamara [name] and advised that patient was being discharged and would be [facility] about [facility].  Mrs. Tamara [name] indicated that she would try to be at the hospital before 2.  CSW signing off.   Tamara SciaraHeather Delight [name], [place] [phone]

## 2014-05-30 NOTE — Evaluation (Signed)
Physical Therapy Evaluation Patient Details Name: Tamara [name] MRN: [id number] DOB: 01/12/1925 Today's Date: 05/30/2014   History of Present Illness  Pt is a resident of [facility] who is admitted with sepsis secondary to a UTI.  She has a hx of dementia but it is mild and she is able to converse appropriately and follow all directions without difficulty.  Clinical Impression  Pt was seen for evaluation.  She is alert and oriented to person and place as well as situation.  She is found to be significantly deconditioned from her baseline needing max assist to stand from sitting.  She was only able to ambulate 30' with a walker, extremely labored gait pattern.  Currently, pt is a high fall risk and she would benefit from short term SNF at d/c.    Follow Up Recommendations SNF    Equipment Recommendations  None recommended by PT    Recommendations for Other Services   OT    Precautions / Restrictions Precautions Precautions: Fall Restrictions Weight Bearing Restrictions: No      Mobility  Bed Mobility               General bed mobility comments: not assessed...pt up in a chair  Transfers Overall transfer level: Needs assistance Equipment used: Rolling walker (2 wheeled) Transfers: Sit to/from Stand Sit to Stand: Max assist            Ambulation/Gait Ambulation/Gait assistance: Min guard Ambulation Distance (Feet): 30 Feet Assistive device: Rolling walker (2 wheeled) Gait Pattern/deviations: Trunk flexed;Decreased stride length;Shuffle   Gait velocity interpretation: Below normal speed for age/gender General Gait Details: gait is very labored and slow                    Balance Overall balance assessment: Needs assistance Sitting-balance support: No upper extremity supported;Feet supported Sitting balance-Leahy Scale: Good     Standing balance support: Bilateral upper extremity supported Standing balance-Leahy Scale: Fair                                Pertinent Vitals/Pain Pain Assessment: No/denies pain    Home Living Family/patient expects to be discharged to:: Skilled nursing facility                      Prior Function Level of Independence: Needs assistance   Gait / Transfers Assistance Needed: ambulates with a walker independently and transfers independently  ADL's / Homemaking Assistance Needed: min assist with bathing and dressing        Hand Dominance   Dominant Hand: Right    Extremity/Trunk Assessment   Upper Extremity Assessment: Generalized weakness           Lower Extremity Assessment: Generalized weakness      Cervical / Trunk Assessment: Kyphotic (severe kyphoscoliosis)  Communication   Communication: HOH  Cognition Arousal/Alertness: Awake/alert Behavior During Therapy: WFL for tasks assessed/performed Overall Cognitive Status: History of cognitive impairments - at baseline                                    Assessment/Plan    PT Assessment All further PT needs can be met in the next venue of care  PT Diagnosis Difficulty walking;Abnormality of gait;Generalized weakness   PT Problem List Decreased strength;Decreased activity tolerance;Decreased balance;Decreased mobility  PT Treatment Interventions       PT Goals (Current goals can be found in the Care Plan section) Acute Rehab PT Goals PT Goal Formulation: All assessment and education complete, DC therapy                                End of Session Equipment Utilized During Treatment: Gait belt Activity Tolerance: Patient limited by fatigue Patient left: in chair;with call bell/phone within reach;with chair alarm set;with family/visitor present           Time: 1323-1349 PT Time Calculation (min) (ACUTE ONLY): 26 min   Charges:   PT Evaluation $Initial PT Evaluation Tier I: 1 Procedure      ,  L 05/30/2014, 1:59 PM   

## 2014-05-30 NOTE — Discharge Summary (Signed)
Physician Discharge Summary  Tamara [name]:[id number] DOB: 19-Jul-1924 DOA: 05/28/2014  PCP: Catalina Pizza, MD  Admit date: 05/28/2014 Discharge date: 05/30/2014  Time spent: 45 minutes  Recommendations for Outpatient Follow-up:  -Will be discharge back to ALF. -Has 5 days of Bactrim and 7 days of magnesium oxide pending.   Discharge Diagnoses:  Principal Problem:   Sepsis Active Problems:   Atrial fibrillation   Hypertension   Chronic anticoagulation   Degenerative joint disease   Hypokalemia   Gram-negative bacteremia   UTI (lower urinary tract infection)   Discharge Condition: Stable and improved  Filed Weights   05/28/14 0741  Weight: 63.05 kg (139 lb)    History of present illness:  Tamara [name] is a 81 y.o. female with a past medical history that includes hypertension, A. fib on Coumadin, colitis, hiatal hernia, mild dementia presents to the emergency department from assisted living with the chief complaint of positive blood culture. Information is obtained from the family who is at the bedside and reported that yesterday patient was in the emergency department with complaints of a one-day history of nausea vomiting diarrhea evaluation revealed a urinary tract infection. At that time she was given Keflex some IV fluids and discharged back to assisted living. Today family was informed of 1 positive blood culture by the facility and was requested to take patient to the emergency department. Still he also reported a fever during the night and was given Tylenol. Patient denies any chest pain palpitations abdominal pain nausea. She reports no vomiting or loose stool for a day. He denies headache dizziness syncope or near-syncope. She denies dysuria hematuria but does endorse some frequency. She reports feeling "weak"  Workup in the emergency department includes complete metabolic panel significant for a potassium of 3.2, complete blood count significant for platelets of  130, initial troponin 0.00 lactic acid 1.52, INR 2.12, urinalysis reveals many bacteria WBCs and RBCs too numerous to count trace leukocytes, history x-ray with stable cardiomegaly no infiltrate or consolidation. Upon arrival to the emergency department she had a rectal temp of 101 heart rate of 87. Blood pressure dropped to 94/53 and saturation level dropped to 76% as well. She was given IV fluids and antibiotics per sepsis protocol.  Hospital Course:   Sepsis -Sepsis parameters resolved. -Secondary to Escherichia coli UTI and bacteremia. -Will be discharged on 5 days of Bactrim, has received 3 days of cefepime.  E coli UTI and septicemia -As above  Atrial fibrillation -Rate controlled. -Chronically anticoagulated on warfarin.  Hypokalemia/hypomagnesemia -Repleted, potassium still low at 3.2. We'll give 40 mEq of potassium prior to discharge and will be on magnesium oxide 3 times a day for 7 more days.  Procedures:  None   Consultations:  None  Discharge Instructions  Discharge Instructions    Increase activity slowly    Complete by:  As directed             Medication List    STOP taking these medications        cephALEXin 500 MG capsule  Commonly known as:  KEFLEX      TAKE these medications        acetaminophen 500 MG tablet  Commonly known as:  TYLENOL  Take 1,000 mg by mouth 2 (two) times daily.     diltiazem 120 MG tablet  Commonly known as:  CARDIZEM  Take 120 mg by mouth daily.     DSS 100 MG Caps  Take 100  mg by mouth 2 (two) times daily.     ENSURE  Take 1 Can by mouth daily.     escitalopram 10 MG tablet  Commonly known as:  LEXAPRO  Take 1 tablet (10 mg total) by mouth daily.     levothyroxine 100 MCG tablet  Commonly known as:  SYNTHROID, LEVOTHROID  Take 100 mcg by mouth daily before breakfast.     magnesium oxide 400 (241.3 MG) MG tablet  Commonly known as:  MAG-OX  Take 1 tablet (400 mg total) by mouth 3 (three) times daily.       memantine 10 MG tablet  Commonly known as:  NAMENDA  Take 10 mg by mouth 2 (two) times daily. Administered at 8am and 5pm     omeprazole 40 MG capsule  Commonly known as:  PRILOSEC  Take 1 capsule (40 mg total) by mouth daily. For heartburn     polyethylene glycol packet  Commonly known as:  MIRALAX / GLYCOLAX  Take 17 g by mouth daily as needed for mild constipation.     potassium chloride 10 MEQ tablet  Commonly known as:  K-DUR  Take 10 mEq by mouth daily.     sulfamethoxazole-trimethoprim 800-160 MG per tablet  Commonly known as:  BACTRIM DS,SEPTRA DS  Take 1 tablet by mouth 2 (two) times daily.     SYSTANE OP  Place 1 drop into both eyes 2 (two) times daily. 1 drop into both eyes     warfarin 5 MG tablet  Commonly known as:  COUMADIN  Take 2.5-5 mg by mouth daily. Take 1 tablet (5 mg) everyday except Friday. On Friday, take .5 tablet (2.5 mg)       No Known Allergies     Follow-up Information    Follow up with Catalina Pizza, MD. Schedule an appointment as soon as possible for a visit in 2 weeks.   Specialty:  Internal Medicine   Contact information:    [address]  [place] [address] [phone]        The results of significant diagnostics from this hospitalization (including imaging, microbiology, ancillary and laboratory) are listed below for reference.    Significant Diagnostic Studies: Dg Lumbar Spine 2-3 Views  05/27/2014   CLINICAL DATA:  Patient fell out of bed.  Low back pain.  EXAM: LUMBAR SPINE - 2-3 VIEW  COMPARISON:  03/06/2013  FINDINGS: Diffuse bone demineralization. Normal alignment of the lumbar spine. Anterior compression of T12 vertebra representing about 70% maximal loss of height anteriorly. This demonstrates progression since previous study. Diffuse degenerative change throughout the lumbar spine with narrowed interspaces and prominent endplate hypertrophic changes. Vascular calcifications.  IMPRESSION: Progression of anterior  compression of T12 since prior study. Diffuse degenerative changes in the lumbar spine.   Electronically Signed   By: Burman Nieves M.D.   On: 05/27/2014 06:21   Dg Pelvis 1-2 Views  05/27/2014   CLINICAL DATA:  Patient fell out of bed.  Low back pain.  EXAM: PELVIS - 1-2 VIEW  COMPARISON:  None.  FINDINGS: Degenerative changes in the lower lumbar spine and hips. Pelvis appears intact. No evidence of acute fracture or dislocation. SI joints and symphysis pubis are not displaced.  IMPRESSION: Degenerative changes in the lumbar spine and hips. No displaced fractures identified.   Electronically Signed   By: Burman Nieves M.D.   On: 05/27/2014 06:22   Dg Chest Port 1 View  05/28/2014   CLINICAL DATA:  Sepsis.  History of atrial fibrillation  EXAM: PORTABLE CHEST - 1 VIEW  COMPARISON:  March 06, 2013  FINDINGS: There is underlying emphysematous change. There is no edema or consolidation. Heart is mildly enlarged with pulmonary vascularity within normal limits. No adenopathy. No bone lesions.  IMPRESSION: Underlying emphysema. No edema or consolidation. Stable cardiomegaly.   Electronically Signed   By: Bretta Bang III M.D.   On: 05/28/2014 08:27    Microbiology: Recent Results (from the past 240 hour(s))  Blood culture (routine x 2)     Status: None   Collection Time: 05/27/14  5:29 AM  Result Value Ref Range Status   Specimen Description BLOOD RIGHT ANTECUBITAL DRAWN BY RN YW  Final   Special Requests BOTTLES DRAWN AEROBIC AND ANAEROBIC [facility] EACH  Final   Culture   Final    ESCHERICHIA COLI Note: Gram Stain Report Called to,Read Back By and Verified With: FLOW MANAGER NEWMAN 0200 07/28/2014 BY FORSYTH K Performed at [facility] Performed at [facility]    Report Status 05/30/2014 FINAL  Final   Organism ID, Bacteria ESCHERICHIA COLI  Final      Susceptibility   Escherichia coli - MIC*    AMPICILLIN >=32 RESISTANT Resistant     AMPICILLIN/SULBACTAM >=32 RESISTANT  Resistant     CEFAZOLIN <=4 SENSITIVE Sensitive     CEFEPIME <=1 SENSITIVE Sensitive     CEFTAZIDIME <=1 SENSITIVE Sensitive     CEFTRIAXONE <=1 SENSITIVE Sensitive     CIPROFLOXACIN >=4 RESISTANT Resistant     GENTAMICIN <=1 SENSITIVE Sensitive     IMIPENEM <=0.25 SENSITIVE Sensitive     PIP/TAZO <=4 SENSITIVE Sensitive     TOBRAMYCIN <=1 SENSITIVE Sensitive     TRIMETH/SULFA <=20 SENSITIVE Sensitive     * ESCHERICHIA COLI  Blood culture (routine x 2)     Status: None (Preliminary result)   Collection Time: 05/27/14  5:40 AM  Result Value Ref Range Status   Specimen Description BLOOD LEFT HAND  Final   Special Requests   Final    BOTTLES DRAWN AEROBIC AND ANAEROBIC AEB=6CC ANA=4CC   Culture NO GROWTH 3 DAYS  Final   Report Status PENDING  Incomplete  Urine culture     Status: None   Collection Time: 05/27/14  6:38 AM  Result Value Ref Range Status   Specimen Description URINE, CLEAN CATCH  Final   Special Requests NONE  Final   Colony Count   Final    >=100,000 COLONIES/ML Performed at [facility]    Culture   Final    ESCHERICHIA COLI Performed at [facility]    Report Status 05/30/2014 FINAL  Final   Organism ID, Bacteria ESCHERICHIA COLI  Final      Susceptibility   Escherichia coli - MIC*    AMPICILLIN >=32 RESISTANT Resistant     CEFAZOLIN <=4 SENSITIVE Sensitive     CEFTRIAXONE <=1 SENSITIVE Sensitive     CIPROFLOXACIN >=4 RESISTANT Resistant     GENTAMICIN <=1 SENSITIVE Sensitive     LEVOFLOXACIN >=8 RESISTANT Resistant     NITROFURANTOIN <=16 SENSITIVE Sensitive     TOBRAMYCIN <=1 SENSITIVE Sensitive     TRIMETH/SULFA <=20 SENSITIVE Sensitive     PIP/TAZO <=4 SENSITIVE Sensitive     * ESCHERICHIA COLI  Blood Culture (routine x 2)     Status: None (Preliminary result)   Collection Time: 05/28/14  8:02 AM  Result Value Ref Range Status   Specimen Description BLOOD  PICC LINE DRAWN BY RN  Final   Special Requests   Final    BOTTLES DRAWN  AEROBIC AND ANAEROBIC AEB=8CC ANA=6CC   Culture NO GROWTH 2 DAYS  Final   Report Status PENDING  Incomplete  Blood Culture (routine x 2)     Status: None (Preliminary result)   Collection Time: 05/28/14  8:04 AM  Result Value Ref Range Status   Specimen Description BLOOD RIGHT HAND  Final   Special Requests BOTTLES DRAWN AEROBIC AND ANAEROBIC 6CC  Final   Culture NO GROWTH 2 DAYS  Final   Report Status PENDING  Incomplete  Urine culture     Status: None   Collection Time: 05/28/14  8:40 AM  Result Value Ref Range Status   Specimen Description URINE, CATHETERIZED  Final   Special Requests NONE  Final   Colony Count NO GROWTH Performed at [facility]   Final   Culture NO GROWTH Performed at [facility]   Final   Report Status 05/29/2014 FINAL  Final     Labs: Basic Metabolic Panel:  Recent Labs Lab 05/27/14 0529 05/28/14 0753 05/28/14 0755 05/29/14 0617 05/30/14 0700  NA 139  --  140 139 140  K 3.4*  --  3.2* 3.2* 3.2*  CL 104  --  106 104 105  CO2 26  --  25 27 26   GLUCOSE 127*  --  105* 89 95  BUN 17  --  14 10 10   CREATININE 0.75  --  0.66 0.58 0.55  CALCIUM 9.8  --  9.3 8.7 8.8  MG  --  1.5  --   --   --    Liver Function Tests:  Recent Labs Lab 05/27/14 0529 05/28/14 0755 05/29/14 0617  AST 39* 43* 33  ALT 17 18 19   ALKPHOS 84 67 59  BILITOT 1.2 0.7 0.6  PROT 7.5 6.5 5.8*  ALBUMIN 4.2 3.5 3.1*    Recent Labs Lab 05/28/14 0755  LIPASE 20   No results for input(s): AMMONIA in the last 168 hours. CBC:  Recent Labs Lab 05/27/14 0529 05/28/14 0755 05/29/14 0617 05/30/14 0700  WBC 15.4* 6.1 6.2 7.2  NEUTROABS 13.9* 5.0  --   --   HGB 15.4* 14.7 12.6 12.7  HCT 45.9 44.1 37.8 38.2  MCV 97.7 96.7 97.2 95.0  PLT 165 130* 125* 133*   Cardiac Enzymes: No results for input(s): CKTOTAL, CKMB, CKMBINDEX, TROPONINI in the last 168 hours. BNP: BNP (last 3 results) No results for input(s): BNP in the last 8760 hours.  ProBNP  (last 3 results) No results for input(s): PROBNP in the last 8760 hours.  CBG: No results for input(s): GLUCAP in the last 168 hours.     SignedChaya Jan:  HERNANDEZ ACOSTA,Jarrel Knoke  [facility] Pager: [phone] 05/30/2014, 9:45 AM

## 2014-05-31 ENCOUNTER — Other Ambulatory Visit (HOSPITAL_COMMUNITY)
Admission: RE | Admit: 2014-05-31 | Discharge: 2014-05-31 | Disposition: A | Discharge status: Home / Self Care | Payer: Medicare PPO | Source: Other Acute Inpatient Hospital | Attending: Internal Medicine | Admitting: Internal Medicine

## 2014-05-31 LAB — CBC WITH DIFFERENTIAL/PLATELET
BASOS PCT: 0 % (ref 0–1)
Basophils Absolute: 0 10*3/uL (ref 0.0–0.1)
Eosinophils Absolute: 0.1 10*3/uL (ref 0.0–0.7)
Eosinophils Relative: 1 % (ref 0–5)
HCT: 37.2 % (ref 36.0–46.0)
Hemoglobin: 12.5 g/dL (ref 12.0–15.0)
Lymphocytes Relative: 23 % (ref 12–46)
Lymphs Abs: 1.4 10*3/uL (ref 0.7–4.0)
MCH: 32.1 pg (ref 26.0–34.0)
MCHC: 33.6 g/dL (ref 30.0–36.0)
MCV: 95.6 fL (ref 78.0–100.0)
MONO ABS: 0.9 10*3/uL (ref 0.1–1.0)
Monocytes Relative: 15 % — ABNORMAL HIGH (ref 3–12)
Neutro Abs: 3.8 10*3/uL (ref 1.7–7.7)
Neutrophils Relative %: 61 % (ref 43–77)
Platelets: 142 10*3/uL — ABNORMAL LOW (ref 150–400)
RBC: 3.89 MIL/uL (ref 3.87–5.11)
RDW: 13 % (ref 11.5–15.5)
WBC: 6.2 10*3/uL (ref 4.0–10.5)

## 2014-05-31 LAB — PROTIME-INR
INR: 3.5 — ABNORMAL HIGH (ref 0.00–1.49)
Prothrombin Time: 35.4 seconds — ABNORMAL HIGH (ref 11.6–15.2)

## 2014-05-31 LAB — BASIC METABOLIC PANEL
ANION GAP: 8 (ref 5–15)
BUN: 13 mg/dL (ref 6–23)
CHLORIDE: 109 mmol/L (ref 96–112)
CO2: 26 mmol/L (ref 19–32)
Calcium: 8.7 mg/dL (ref 8.4–10.5)
Creatinine, Ser: 0.59 mg/dL (ref 0.50–1.10)
GFR calc Af Amer: >90 mL/min (ref >90)
GFR calc non Af Amer: 80 mL/min — ABNORMAL LOW (ref >90)
GLUCOSE: 89 mg/dL (ref 70–99)
POTASSIUM: 3.3 mmol/L — AB (ref 3.5–5.1)
Sodium: 143 mmol/L (ref 135–145)

## 2014-06-02 ENCOUNTER — Non-Acute Institutional Stay (SKILLED_NURSING_FACILITY): Payer: Medicare PPO | Admitting: Internal Medicine

## 2014-06-02 ENCOUNTER — Encounter (HOSPITAL_COMMUNITY)
Admission: RE | Admit: 2014-06-02 | Discharge: 2014-06-02 | Disposition: A | Discharge status: Home / Self Care | Payer: Medicare PPO | Source: Skilled Nursing Facility | Attending: Internal Medicine | Admitting: Internal Medicine

## 2014-06-02 DIAGNOSIS — N390 Urinary tract infection, site not specified: Secondary | ICD-10-CM | POA: Diagnosis not present

## 2014-06-02 DIAGNOSIS — B9620 Unspecified Escherichia coli [E. coli] as the cause of diseases classified elsewhere: Secondary | ICD-10-CM | POA: Diagnosis not present

## 2014-06-02 DIAGNOSIS — I482 Chronic atrial fibrillation, unspecified: Secondary | ICD-10-CM

## 2014-06-02 DIAGNOSIS — A419 Sepsis, unspecified organism: Secondary | ICD-10-CM | POA: Diagnosis not present

## 2014-06-02 DIAGNOSIS — R6521 Severe sepsis with septic shock: Secondary | ICD-10-CM | POA: Diagnosis not present

## 2014-06-02 DIAGNOSIS — E876 Hypokalemia: Secondary | ICD-10-CM

## 2014-06-02 LAB — CULTURE, BLOOD (ROUTINE X 2)
CULTURE: NO GROWTH
CULTURE: NO GROWTH
Culture: NO GROWTH

## 2014-06-02 LAB — PROTIME-INR
INR: 3.47 — AB (ref 0.00–1.49)
PROTHROMBIN TIME: 35.1 s — AB (ref 11.6–15.2)

## 2014-06-03 ENCOUNTER — Other Ambulatory Visit (HOSPITAL_COMMUNITY)
Admission: AD | Admit: 2014-06-03 | Discharge: 2014-06-03 | Disposition: A | Discharge status: Home / Self Care | Payer: Medicare PPO | Source: Skilled Nursing Facility | Attending: Internal Medicine | Admitting: Internal Medicine

## 2014-06-03 NOTE — Progress Notes (Addendum)
Patient ID: Tamara [name], female   DOB: 05/24/1924, 81 y.o.   MRN: [id number]                 HISTORY & PHYSICAL  DATE:  06/02/2014           FACILITY: [facility]                     LEVEL OF CARE:   SNF   CHIEF COMPLAINT:  Admission to SNF, post stay at [facility], 05/28/2014 through 05/30/2014.            HISTORY OF PRESENT ILLNESS:  This is a patient who apparently came to the emergency room with a  chief complaint of nausea, vomiting, and diarrhea.  Evaluation revealed a UTI.  She was sent back to her assisted living.  However, her urine culture on 05/27/2014 was positive for E.coli and one of two blood cultures also showed E.coli.  The E.coli was quinolone-resistant.  She was called back a day later when the blood culture became available.    In the hospital, she was found to have an INR of 3.2, platelets of 130.  Her lactic acid was 1.52, INR at 2.12.  She had a rectal temperature of 101, a heart rate of 87.  Her blood pressure was 94/53 and she was hypoxemic with a saturation of 76%.  She was treated with sepsis protocol.  She received three days of cefepime and was discharged on five days of Bactrim.    PAST MEDICAL HISTORY/PROBLEM LIST:           Atrial fibrillation.    Hypertension.     Chronic anticoagulation.  On Coumadin.    Degenerative joint disease.    Hypokalemia.    Osteoarthritis.    Hypomagnesemia.    CURRENT MEDICATIONS:  Discharge medications include:     Tylenol 1000 b.i.d.       Cardizem CD 120 q.d.       Colace 100 b.i.d.       Ensure 1 can daily.    Lexapro 10 q.d.       Synthroid 100 q.d.       Magnesium oxide 400 three times daily.      Namenda 10 mg b.i.d.        Prilosec 40 q.d.       MiraLAX 17 g daily.     K-Dur 10 mEq daily.      Septra 1 tablet b.i.d.       Systane ophthalmic b.i.d.        Coumadin 5 mg every day except Friday when she takes 2.5 mg.      SOCIAL HISTORY:                   HOUSING:   She lives in assisted living.   FUNCTIONAL STATUS:  Tells me that she uses a walker.  Has not had any falls recently.    FAMILY HISTORY:     History of heart disease and cancer.       REVIEW OF SYSTEMS:            CHEST/RESPIRATORY:  The patient is not complaining of shortness of breath.     CARDIAC:  No chest pain.    GI:  No nausea, vomiting or diarrhea, but she does not have an appetite.    GU:  She states she is incontinent, but no dysuria.  PHYSICAL EXAMINATION:   VITAL SIGNS:     PULSE:  84, in atrial fibrillation.    RESPIRATIONS:  24.      02 SATURATIONS:  95% on room air.   GENERAL APPEARANCE:  Pleasant woman who is alert and conversational.   CHEST/RESPIRATORY:  Clear air entry bilaterally.     CARDIOVASCULAR:   CARDIAC:  AFib.  There are no murmurs.   She appears to be euvolemic.   GASTROINTESTINAL:   LIVER/SPLEEN/KIDNEY:   No liver, no spleen.  No tenderness.    GENITOURINARY:   BLADDER:  No suprapubic fullness or tenderness.  No costovertebral angle tenderness.   CIRCULATION:   EDEMA/VARICOSITIES:  Extremities:  No edema.  No evidence of a DVT.     NEUROLOGICAL:   SENSATION/STRENGTH:  She has antigravity strength bilaterally.   PSYCHIATRIC:   MENTAL STATUS:    She was able to answer most of my questions.  Stated that she had been at [facility] for two years.  Did not seem to know the year, but looked at the calendar to figure that out.     ASSESSMENT/PLAN:               E.coli septicemia from a urinary tract infection.  Discharged on Bactrim.    Bactrim/Coumadin interaction.  This will need to be checked right away.    Hypomagnesemia, hypokalemia.  These will also need to be rechecked.  I am not sure if she had a history of this or not.  The patient was here in early 2015, at which time she had influenza A.  She had significant protein calorie malnutrition at the time.     Hypothyroidism.  On replacement.    Excessive anticoagulation.  On 05/31/2014, her  INR was 3.5.  INR was 3.64 on 05/30/2014, and 2.66 on 05/29/2014.    Atrial fibrillation: HR is controlled   The patient appears remarkably stable from a UTI/sepsis point of view.  She will need her electrolytes checked as well as a stat PT and INR if this has not already been done.      CPT CODE: 16109                   ADDENDUM:  Her INR from two days ago was 3.50.  The Coumadin was put on hold.  Her potassium was 3.3.  She has been started on 20 mEq b.i.d., which is appropriate.  I do not see a follow-up magnesium level.

## 2014-06-04 ENCOUNTER — Encounter (HOSPITAL_COMMUNITY)
Admission: RE | Admit: 2014-06-04 | Discharge: 2014-06-04 | Disposition: A | Discharge status: Home / Self Care | Payer: Medicare PPO | Source: Ambulatory Visit | Attending: Internal Medicine | Admitting: Internal Medicine

## 2014-06-04 LAB — BASIC METABOLIC PANEL
Anion gap: 9 (ref 5–15)
BUN: 15 mg/dL (ref 6–23)
CALCIUM: 10.2 mg/dL (ref 8.4–10.5)
CO2: 27 mmol/L (ref 19–32)
CREATININE: 0.76 mg/dL (ref 0.50–1.10)
Chloride: 102 mmol/L (ref 96–112)
GFR calc Af Amer: 85 mL/min — ABNORMAL LOW (ref >90)
GFR calc non Af Amer: 74 mL/min — ABNORMAL LOW (ref >90)
GLUCOSE: 88 mg/dL (ref 70–99)
Potassium: 5.2 mmol/L — ABNORMAL HIGH (ref 3.5–5.1)
Sodium: 138 mmol/L (ref 135–145)

## 2014-06-04 LAB — MAGNESIUM: Magnesium: 1.8 mg/dL (ref 1.5–2.5)

## 2014-06-06 ENCOUNTER — Encounter (HOSPITAL_COMMUNITY)
Admission: RE | Admit: 2014-06-06 | Discharge: 2014-06-06 | Disposition: A | Discharge status: Home / Self Care | Payer: Medicare PPO | Source: Skilled Nursing Facility | Attending: Internal Medicine | Admitting: Internal Medicine

## 2014-06-06 LAB — PROTIME-INR
INR: 1.13 (ref 0.00–1.49)
PROTHROMBIN TIME: 14.7 s (ref 11.6–15.2)

## 2014-06-07 ENCOUNTER — Encounter (HOSPITAL_COMMUNITY)
Admission: RE | Admit: 2014-06-07 | Discharge: 2014-06-07 | Disposition: A | Discharge status: Home / Self Care | Payer: Medicare PPO | Source: Skilled Nursing Facility | Attending: Internal Medicine | Admitting: Internal Medicine

## 2014-06-07 ENCOUNTER — Encounter (HOSPITAL_COMMUNITY)
Admission: RE | Admit: 2014-06-07 | Discharge: 2014-06-07 | Disposition: A | Discharge status: Home / Self Care | Payer: Medicare PPO | Attending: Internal Medicine | Admitting: Internal Medicine

## 2014-06-07 ENCOUNTER — Other Ambulatory Visit (HOSPITAL_COMMUNITY)
Admission: RE | Admit: 2014-06-07 | Discharge: 2014-06-07 | Disposition: A | Discharge status: Home / Self Care | Payer: Medicare PPO | Attending: Internal Medicine | Admitting: Internal Medicine

## 2014-06-07 LAB — PROTIME-INR
INR: 1.12 (ref 0.00–1.49)
Prothrombin Time: 14.5 seconds (ref 11.6–15.2)

## 2014-06-08 ENCOUNTER — Encounter (HOSPITAL_COMMUNITY)
Admission: RE | Admit: 2014-06-08 | Discharge: 2014-06-08 | Disposition: A | Discharge status: Home / Self Care | Payer: Medicare PPO | Attending: Internal Medicine | Admitting: Internal Medicine

## 2014-06-08 LAB — PROTIME-INR
INR: 1.16 (ref 0.00–1.49)
Prothrombin Time: 15 seconds (ref 11.6–15.2)

## 2014-06-09 ENCOUNTER — Non-Acute Institutional Stay (SKILLED_NURSING_FACILITY): Payer: Medicare PPO | Admitting: Internal Medicine

## 2014-06-09 ENCOUNTER — Encounter (HOSPITAL_COMMUNITY)
Admission: RE | Admit: 2014-06-09 | Discharge: 2014-06-09 | Disposition: A | Discharge status: Home / Self Care | Payer: Medicare PPO | Attending: Internal Medicine | Admitting: Internal Medicine

## 2014-06-09 DIAGNOSIS — I482 Chronic atrial fibrillation, unspecified: Secondary | ICD-10-CM

## 2014-06-09 DIAGNOSIS — E876 Hypokalemia: Secondary | ICD-10-CM | POA: Diagnosis not present

## 2014-06-09 LAB — PROTIME-INR
INR: 1.29 (ref 0.00–1.49)
Prothrombin Time: 16.3 seconds — ABNORMAL HIGH (ref 11.6–15.2)

## 2014-06-11 ENCOUNTER — Encounter (HOSPITAL_COMMUNITY)
Admission: RE | Admit: 2014-06-11 | Discharge: 2014-06-11 | Disposition: A | Discharge status: Home / Self Care | Payer: Medicare PPO | Attending: Internal Medicine | Admitting: Internal Medicine

## 2014-06-11 LAB — BASIC METABOLIC PANEL
Anion gap: 8 (ref 5–15)
BUN: 17 mg/dL (ref 6–23)
CHLORIDE: 107 mmol/L (ref 96–112)
CO2: 26 mmol/L (ref 19–32)
Calcium: 9.4 mg/dL (ref 8.4–10.5)
Creatinine, Ser: 0.48 mg/dL — ABNORMAL LOW (ref 0.50–1.10)
GFR calc non Af Amer: 86 mL/min — ABNORMAL LOW (ref >90)
GLUCOSE: 88 mg/dL (ref 70–99)
Potassium: 3.7 mmol/L (ref 3.5–5.1)
Sodium: 141 mmol/L (ref 135–145)

## 2014-06-11 LAB — PROTIME-INR
INR: 1.55 — ABNORMAL HIGH (ref 0.00–1.49)
PROTHROMBIN TIME: 18.7 s — AB (ref 11.6–15.2)

## 2014-06-13 NOTE — Progress Notes (Addendum)
Patient ID: Tamara HaggardBetty S [name], female   DOB: 02/05/1925, 81 y.o.   MRN: [id number]                PROGRESS NOTE  DATE:  06/09/2014         FACILITY: [facility]                    LEVEL OF CARE:   SNF    Acute Visit                   CHIEF COMPLAINT:  Medical issues including coagulation, AFib, etc.       HISTORY OF PRESENT ILLNESS:  This is a patient who lived in assisted living.    She was admitted to hospital with E.coli sepsis secondary to an E.coli UTI.  She came to us on a combination of Bactrim and Coumadin.  Predictably, her INR went high.  At one point, I had to put this on hold secondary to an INR of 3.6.  Unfortunately, I do not believe her Coumadin got restarted again until 06/06/2014, at which time her INR was 1.13.  Today, it is 1.29.  Currently on 6 mg a day.    REVIEW OF SYSTEMS:    CHEST/RESPIRATORY:  The patient is not complaining of cough or shortness of breath.   CARDIAC:  No chest pain.    GI:  No nausea, vomiting, or diarrhea.   MUSCULOSKELETAL:  She tells me she is working hard in therapy.    PHYSICAL EXAMINATION:   VITAL SIGNS:   O2 SATURATIONS:  97% on room air.   RESPIRATIONS:    18 and unlabored.   PULSE:     72 and irregular.   GENERAL APPEARANCE:  The patient is not in any distress.     CHEST/RESPIRATORY:  Exam is clear.        CARDIOVASCULAR:   CARDIAC:  AFib, but she appears to be euvolemic.  Heart rate is controlled.      GASTROINTESTINAL:   ABDOMEN:  Soft.    LIVER/SPLEEN/KIDNEYS:  No liver, no spleen.  No tenderness.    ASSESSMENT/PLAN:                     Atrial fibrillation.  Her heart rate is controlled.  Unfortunately, there appears to have been quite a gap of time that she did not receive Coumadin, at which time I think her antibiotics stopped.  This is the reason for the subtherapeutic INR.    On 06/04/2014, she had a potassium of 5.2.  Her BUN and creatinine were normal.  She is not currently on potassium that I can see.   She was hypokalemic in the hospital, I believe to a low of 3.2.    Hypomagnesemia.  This was checked on 06/04/2014.  It was normal.  Her mag ox was discontinued.

## 2014-06-16 ENCOUNTER — Encounter (HOSPITAL_COMMUNITY)
Admission: RE | Admit: 2014-06-16 | Discharge: 2014-06-16 | Disposition: A | Discharge status: Home / Self Care | Payer: Medicare PPO | Source: Skilled Nursing Facility | Attending: Internal Medicine | Admitting: Internal Medicine

## 2014-06-16 LAB — PROTIME-INR
INR: 3.18 — AB (ref 0.00–1.49)
Prothrombin Time: 32.9 seconds — ABNORMAL HIGH (ref 11.6–15.2)

## 2014-06-18 ENCOUNTER — Encounter (HOSPITAL_COMMUNITY)
Admission: RE | Admit: 2014-06-18 | Discharge: 2014-06-18 | Disposition: A | Discharge status: Home / Self Care | Payer: Medicare PPO | Attending: Internal Medicine | Admitting: Internal Medicine

## 2014-06-18 ENCOUNTER — Non-Acute Institutional Stay: Payer: Medicare PPO | Admitting: Internal Medicine

## 2014-06-18 DIAGNOSIS — N390 Urinary tract infection, site not specified: Secondary | ICD-10-CM

## 2014-06-18 DIAGNOSIS — B9620 Unspecified Escherichia coli [E. coli] as the cause of diseases classified elsewhere: Secondary | ICD-10-CM

## 2014-06-18 DIAGNOSIS — I482 Chronic atrial fibrillation, unspecified: Secondary | ICD-10-CM

## 2014-06-18 LAB — PROTIME-INR
INR: 3.3 — AB (ref 0.00–1.49)
Prothrombin Time: 33.8 seconds — ABNORMAL HIGH (ref 11.6–15.2)

## 2014-06-22 NOTE — Progress Notes (Signed)
Patient ID: Tamara [name], female   DOB: 12/28/1924, 81 y.o.   MRN: [id number]                PROGRESS NOTE  DATE:  06/18/2014         FACILITY: [facility]                    LEVEL OF CARE:   SNF   Acute Visit/Discharge Visit        CHIEF COMPLAINT:  Pre-discharge review.      HISTORY OF PRESENT ILLNESS:  This is a patient whom I admitted here earlier this month after a stay at [facility] with a chief complaint of nausea, vomiting, and diarrhea.  Her evaluation revealed a UTI.  She was sent back to her assisted living [facility]  However, her urine culture was positive for E.coli and 1/2 blood cultures showed E.coli.  She was admitted back to hospital for this.  She was found to be hypotensive, with a rectal temperature of 101.  She was discharged back to us on Bactrim which, of course, had a drug interaction with Coumadin.   We have, therefore, had some difficulty getting this to stabilize.    Most recently, she missed some doses of Coumadin with an INR of 1.12 on 06/07/2014.  She was then started on 5 mg.  She was 1.55 on 06/11/2014.  This was increased to 6 mg.  She was 3.18 on 06/16/2014.  Currently, she is 3.3 on 5 mg.    The patient also has atrial fibrillation, on Coumadin chronically.    LABORATORY DATA:  Last lab work shows:     06/11/2014:  Sodium 141, potassium 3.7, BUN 17, creatinine 0.4.    As noted, her INR today is 3.3.    PHYSICAL EXAMINATION:   VITAL SIGNS:   PULSE:     82, atrial fibrillation.   GENERAL APPEARANCE:  The patient does not look to be in any distress.    CHEST/RESPIRATORY:  Exam is clear.        CARDIOVASCULAR:   CARDIAC:  Heart sounds are regular.  There are no murmurs.   She appears to be euvolemic.    ASSESSMENT/PLAN:          Atrial fibrillation.  Her heart rate is controlled.   I am not going to give her any Coumadin today.  I will resume her on 4 mg tomorrow.  This will need to be rechecked on Monday.  I have some  concern about assisted living management carrying this out.  However, staff here assure me that everything will be okay in terms of the Coumadin monitoring.    Urosepsis with E.coli.  This has been stable.  Her BUN and creatinine are stable.     CPT CODE: [id number]

## 2014-07-17 DIAGNOSIS — Z961 Presence of intraocular lens: Secondary | ICD-10-CM | POA: Diagnosis not present

## 2014-07-24 DIAGNOSIS — M4000 Postural kyphosis, site unspecified: Secondary | ICD-10-CM | POA: Diagnosis not present

## 2014-07-24 DIAGNOSIS — R260 Ataxic gait: Secondary | ICD-10-CM | POA: Diagnosis not present

## 2014-07-24 DIAGNOSIS — Z9181 History of falling: Secondary | ICD-10-CM | POA: Diagnosis not present

## 2014-07-24 DIAGNOSIS — Z7901 Long term (current) use of anticoagulants: Secondary | ICD-10-CM | POA: Diagnosis not present

## 2014-07-24 DIAGNOSIS — N393 Stress incontinence (female) (male): Secondary | ICD-10-CM | POA: Diagnosis not present

## 2014-07-24 DIAGNOSIS — I4891 Unspecified atrial fibrillation: Secondary | ICD-10-CM | POA: Diagnosis not present

## 2014-07-24 DIAGNOSIS — Z87440 Personal history of urinary (tract) infections: Secondary | ICD-10-CM | POA: Diagnosis not present

## 2014-07-24 DIAGNOSIS — M170 Bilateral primary osteoarthritis of knee: Secondary | ICD-10-CM | POA: Diagnosis not present

## 2014-07-24 DIAGNOSIS — I10 Essential (primary) hypertension: Secondary | ICD-10-CM | POA: Diagnosis not present

## 2014-09-02 DIAGNOSIS — I482 Chronic atrial fibrillation: Secondary | ICD-10-CM | POA: Diagnosis not present

## 2014-09-02 DIAGNOSIS — F0390 Unspecified dementia without behavioral disturbance: Secondary | ICD-10-CM | POA: Diagnosis not present

## 2014-09-24 DIAGNOSIS — I482 Chronic atrial fibrillation: Secondary | ICD-10-CM | POA: Diagnosis not present

## 2014-11-05 DIAGNOSIS — I482 Chronic atrial fibrillation: Secondary | ICD-10-CM | POA: Diagnosis not present

## 2014-12-10 DIAGNOSIS — I482 Chronic atrial fibrillation: Secondary | ICD-10-CM | POA: Diagnosis not present

## 2014-12-11 ENCOUNTER — Emergency Department (HOSPITAL_COMMUNITY): Payer: Medicare PPO

## 2014-12-11 ENCOUNTER — Emergency Department (HOSPITAL_COMMUNITY)
Admission: EM | Admit: 2014-12-11 | Discharge: 2014-12-11 | Disposition: A | Discharge status: Home / Self Care | Payer: Medicare PPO | Attending: Emergency Medicine | Admitting: Emergency Medicine

## 2014-12-11 ENCOUNTER — Encounter (HOSPITAL_COMMUNITY): Payer: Self-pay | Admitting: *Deleted

## 2014-12-11 DIAGNOSIS — Z8739 Personal history of other diseases of the musculoskeletal system and connective tissue: Secondary | ICD-10-CM | POA: Insufficient documentation

## 2014-12-11 DIAGNOSIS — Z8701 Personal history of pneumonia (recurrent): Secondary | ICD-10-CM | POA: Diagnosis not present

## 2014-12-11 DIAGNOSIS — Z79899 Other long term (current) drug therapy: Secondary | ICD-10-CM | POA: Diagnosis not present

## 2014-12-11 DIAGNOSIS — Y9389 Activity, other specified: Secondary | ICD-10-CM | POA: Diagnosis not present

## 2014-12-11 DIAGNOSIS — Y9289 Other specified places as the place of occurrence of the external cause: Secondary | ICD-10-CM | POA: Insufficient documentation

## 2014-12-11 DIAGNOSIS — Z7901 Long term (current) use of anticoagulants: Secondary | ICD-10-CM | POA: Diagnosis not present

## 2014-12-11 DIAGNOSIS — Y998 Other external cause status: Secondary | ICD-10-CM | POA: Diagnosis not present

## 2014-12-11 DIAGNOSIS — S22070A Wedge compression fracture of T9-T10 vertebra, initial encounter for closed fracture: Secondary | ICD-10-CM | POA: Diagnosis not present

## 2014-12-11 DIAGNOSIS — I10 Essential (primary) hypertension: Secondary | ICD-10-CM | POA: Diagnosis not present

## 2014-12-11 DIAGNOSIS — M546 Pain in thoracic spine: Secondary | ICD-10-CM | POA: Diagnosis not present

## 2014-12-11 DIAGNOSIS — Z8639 Personal history of other endocrine, nutritional and metabolic disease: Secondary | ICD-10-CM | POA: Insufficient documentation

## 2014-12-11 DIAGNOSIS — M545 Low back pain: Secondary | ICD-10-CM | POA: Diagnosis not present

## 2014-12-11 DIAGNOSIS — S22079A Unspecified fracture of T9-T10 vertebra, initial encounter for closed fracture: Secondary | ICD-10-CM

## 2014-12-11 DIAGNOSIS — W06XXXA Fall from bed, initial encounter: Secondary | ICD-10-CM | POA: Diagnosis not present

## 2014-12-11 DIAGNOSIS — S22078A Other fracture of T9-T10 vertebra, initial encounter for closed fracture: Secondary | ICD-10-CM | POA: Diagnosis not present

## 2014-12-11 DIAGNOSIS — S22080A Wedge compression fracture of T11-T12 vertebra, initial encounter for closed fracture: Secondary | ICD-10-CM | POA: Diagnosis not present

## 2014-12-11 DIAGNOSIS — F0390 Unspecified dementia without behavioral disturbance: Secondary | ICD-10-CM | POA: Insufficient documentation

## 2014-12-11 DIAGNOSIS — S29002A Unspecified injury of muscle and tendon of back wall of thorax, initial encounter: Secondary | ICD-10-CM | POA: Diagnosis present

## 2014-12-11 MED ORDER — IBUPROFEN 800 MG PO TABS: 800.0000 mg | ORAL_TABLET | Freq: Once | ORAL | Status: DC

## 2014-12-11 MED ORDER — DIAZEPAM 2 MG PO TABS
2.0000 mg | ORAL_TABLET | Freq: Once | ORAL | Status: AC
  Administered 2014-12-11: 2 mg via ORAL
  Filled 2014-12-11: qty 1

## 2014-12-11 MED ORDER — OXYCODONE HCL 5 MG PO TABS
2.5000 mg | ORAL_TABLET | ORAL | Status: DC | PRN
Start: 2014-12-11 — End: 2015-07-21

## 2014-12-11 MED ORDER — IBUPROFEN 400 MG PO TABS
600.0000 mg | ORAL_TABLET | Freq: Once | ORAL | Status: AC
  Administered 2014-12-11: 600 mg via ORAL
  Filled 2014-12-11: qty 2

## 2014-12-11 MED ORDER — OXYCODONE HCL 5 MG PO TABS
2.5000 mg | ORAL_TABLET | Freq: Once | ORAL | Status: AC
  Administered 2014-12-11: 2.5 mg via ORAL
  Filled 2014-12-11: qty 1

## 2014-12-11 MED ORDER — ACETAMINOPHEN 500 MG PO TABS
1000.0000 mg | ORAL_TABLET | Freq: Once | ORAL | Status: AC
  Administered 2014-12-11: 1000 mg via ORAL
  Filled 2014-12-11: qty 2

## 2014-12-11 MED ORDER — ONDANSETRON 4 MG PO TBDP: 4.0000 mg | ORAL_TABLET | Freq: Once | ORAL | Status: DC

## 2014-12-11 MED ORDER — OXYCODONE-ACETAMINOPHEN 5-325 MG PO TABS: 0.5000 | ORAL_TABLET | ORAL | Status: DC | PRN

## 2014-12-11 NOTE — ED Notes (Signed)
Per x-ray tech pt complain of nausea from pain medication and they were unable to complete scans

## 2014-12-11 NOTE — ED Provider Notes (Signed)
CSN: [id number]     Arrival date & time 12/11/14  1135 History   First MD Initiated Contact with Patient 12/11/14 1338     Chief Complaint  Patient presents with  . Back Pain     (Consider location/radiation/quality/duration/timing/severity/associated sxs/prior Treatment) Patient is a 81 y.o. female presenting with fall. The history is provided by the patient.  Fall This is a recurrent problem. The current episode started yesterday. The problem occurs constantly. The problem has not changed since onset.Pertinent negatives include no chest pain, no abdominal pain, no headaches and no shortness of breath. Exacerbated by: lying flat. The symptoms are relieved by position. She has tried acetaminophen for the symptoms. The treatment provided no relief.    81 yo F with a chief complaint of a fall. Per her family she slips out of bed about once every couple weeks. Patient slid yesterday fell on her bottom. States that she was fine afterwards was able to walk without issue however yesterday evening started having worsening back pain to her mid T-spine. Patient had to sleep sitting up in a recliner because it hurt to lay back flat. Patient denies any chest pain abdominal pain any sacral pain. Patient denies any weakness to her lower extremities patient denies loss of bowel or bladder. Denies loss of perirectal sensation.  Past Medical History  Diagnosis Date  . Hypertension   . Atrial fibrillation (HCC)     Onset well before 2004; Negative stress nuclear study in 01/2003  . Osteoporosis   . Acute colitis 09/2011    C. difficile negative  . Chronic anticoagulation   . Hyperlipidemia   . Hiatal hernia     by CT in 2013; also noted was an adrenal adenoma, duodenal lipoma right ovarian cyst, post hysterectomy  . Pneumonia 09/2011    09/2011  . Syncope   . Pericardial effusion   . Hypercalcemia   . Dementia    Past Surgical History  Procedure Laterality Date  . Abdominal hysterectomy      No  oophorectomy  . Appendectomy    . Colonoscopy  2005    Reportedly normal screening study   Family History  Problem Relation Age of Onset  . Heart attack Father   . Cancer Mother   . Hypertension Father    Social History  Substance Use Topics  . Smoking status: Never Smoker   . Smokeless tobacco: Never Used  . Alcohol Use: No   OB History    Gravida Para Term Preterm AB TAB SAB Ectopic Multiple Living   2 2 2             Review of Systems  Constitutional: Negative for fever and chills.  HENT: Negative for congestion and rhinorrhea.   Eyes: Negative for redness and visual disturbance.  Respiratory: Negative for shortness of breath and wheezing.   Cardiovascular: Negative for chest pain and palpitations.  Gastrointestinal: Negative for nausea, vomiting and abdominal pain.  Genitourinary: Negative for dysuria and urgency.  Musculoskeletal: Positive for myalgias and arthralgias.  Skin: Negative for pallor and wound.  Neurological: Negative for dizziness and headaches.      Allergies  Review of patient's allergies indicates no known allergies.  Home Medications   Prior to Admission medications   Medication Sig Start Date End Date Taking? Authorizing Provider  acetaminophen (TYLENOL) 500 MG tablet Take 1,000 mg by mouth 2 (two) times daily.   Yes Historical Provider, MD  diltiazem (CARDIZEM) 120 MG tablet Take 120 mg by mouth  daily. 02/06/13  Yes Historical Provider, MD  docusate sodium 100 MG CAPS Take 100 mg by mouth 2 (two) times daily. 02/26/13  Yes Marianne L York, PA-C  escitalopram (LEXAPRO) 10 MG tablet Take 1 tablet (10 mg total) by mouth daily. 05/14/12  Yes Sharon Seller, NP  levothyroxine (SYNTHROID, LEVOTHROID) 100 MCG tablet Take 100 mcg by mouth daily before breakfast.   Yes Historical Provider, MD  memantine (NAMENDA) 10 MG tablet Take 10 mg by mouth 2 (two) times daily. Administered at 8am and 5pm   Yes Historical Provider, MD  omeprazole (PRILOSEC) 40 MG  capsule Take 1 capsule (40 mg total) by mouth daily. For heartburn 05/14/12  Yes Sharon Seller, NP  Polyethyl Glycol-Propyl Glycol (SYSTANE OP) Place 1 drop into both eyes 2 (two) times daily. 1 drop into both eyes   Yes Historical Provider, MD  polyethylene glycol (MIRALAX / GLYCOLAX) packet Take 17 g by mouth daily as needed for mild constipation.   Yes Historical Provider, MD  warfarin (COUMADIN) 4 MG tablet Take 4 mg by mouth daily. 1 tablet on Sunday, Tuesday, Thursday, Friday, Saturday. 1 1/2 tablet on Monday, Wednesday.   Yes Historical Provider, MD  oxyCODONE (ROXICODONE) 5 MG immediate release tablet Take 0.5 tablets (2.5 mg total) by mouth every 4 (four) hours as needed for severe pain. 12/11/14   Melene Plan, DO  oxyCODONE-acetaminophen (PERCOCET) 5-325 MG tablet Take 0.5 tablets by mouth every 4 (four) hours as needed. 12/11/14   Melene Plan, DO  sulfamethoxazole-trimethoprim (BACTRIM DS,SEPTRA DS) 800-160 MG per tablet Take 1 tablet by mouth 2 (two) times daily. Patient not taking: Reported on 12/11/2014 05/30/14   Henderson Cloud, MD  warfarin (COUMADIN) 5 MG tablet Take 2.5-5 mg by mouth daily. Take 1 tablet (5 mg) everyday except Friday. On Friday, take .5 tablet (2.5 mg)    Historical Provider, MD   BP 135/79 mmHg  Pulse 82  Temp(Src) 97.8 F (36.6 C) (Oral)  Resp 20  Ht  (1.778 m)  Wt 150 lb (68.04 kg)  BMI 21.52 kg/m2  SpO2 98% Physical Exam  Constitutional: She is oriented to person, place, and time. She appears well-developed and well-nourished. No distress.  HENT:  Head: Normocephalic and atraumatic.  Eyes: EOM are normal. Pupils are equal, round, and reactive to light.  Neck: Normal range of motion. Neck supple.  Cardiovascular: Normal rate and regular rhythm.  Exam reveals no gallop and no friction rub.   No murmur heard. Pulmonary/Chest: Effort normal. She has no wheezes. She has no rales.  Abdominal: Soft. She exhibits no distension. There is no  tenderness. There is no rebound and no guarding.  Musculoskeletal: She exhibits tenderness (midline spinal tenderness worse about T8). She exhibits no edema.  Pulse motor sensation intact to bilateral lower extremities.  Neurological: She is alert and oriented to person, place, and time.  Skin: Skin is warm and dry. She is not diaphoretic.  Psychiatric: She has a normal mood and affect. Her behavior is normal.  Nursing note and vitals reviewed.   ED Course  Procedures (including critical care time) Labs Review Labs Reviewed - No data to display  Imaging Review Dg Thoracic Spine 2 View  12/11/2014  CLINICAL DATA:  slipped off bed on Monday and fell in the floor on her "rear end".sever pain when laying flat, pain at about L-2 area. Initial encounter. EXAM: THORACIC SPINE 2 VIEWS COMPARISON:  05/28/2014 FINDINGS: Hyperinflation.  Moderate thoracic spondylosis. The frontal  view excludes the inferior lateral right hemi thorax. Midline trachea. Moderate cardiomegaly. Atherosclerosis in the transverse aorta. No pleural effusion or pneumothorax. No congestive failure. There may be scarring at the lateral left lung base. Thoracolumbar junction vertebral body height loss suboptimally evaluated. IMPRESSION: No acute cardiopulmonary disease. Cardiomegaly and aortic atherosclerosis. Exclusion of the inferior right hemi thorax. Electronically Signed   By: Jeronimo Greaves M.D.   On: 12/11/2014 12:31   Dg Lumbar Spine Complete  12/11/2014  CLINICAL DATA:  Larey Seat out of bed on Monday. Lumbar back pain. Initial encounter. EXAM: LUMBAR SPINE - COMPLETE 4+ VIEW COMPARISON:  05/27/2014 FINDINGS: Old T12 vertebral body wedge compression fracture again demonstrated. No evidence of acute lumbar spine fracture or subluxation. Mild to moderate degenerative disc disease is again seen at all lumbar levels. Lower lumbar facet DJD again demonstrated, mainly at L5-S1. IMPRESSION: No acute findings. Old T12 vertebral body  compression fracture. Degenerative spondylosis, as described above. Electronically Signed   By: Myles Rosenthal M.D.   On: 12/11/2014 12:27   Ct Thoracic Spine Wo Contrast  12/11/2014  CLINICAL DATA:  Compression fracture of the thoracic spine after fall. Initial encounter. EXAM: CT THORACIC SPINE WITHOUT CONTRAST TECHNIQUE: Multidetector CT imaging of the thoracic spine was performed without intravenous contrast administration. Multiplanar CT image reconstructions were also generated. COMPARISON:  Lumbar spine CT from earlier today FINDINGS: There is an oblique fracture across the anterior inferior aspect of the T9 vertebral body which extends from the anterior cortex to the T9-10 disc with mild widening anteriorly. There is no height loss or visible posterior element continuation. No subluxation. No additional fracture seen. Chronic T12 compression fracture with advanced height loss (well over 75%) with residual lucency on the left. There is diffuse sclerosis consistent with attempted healing. Progressive anterior endplate spurring and disc narrowing with kyphosis at this level compared to abdominal CT 02/23/2013, consistent with accelerated degenerative disc disease. No retropulsion at either level. Advanced and diffuse spondylosis with multilevel lower thoracic vertebral body ankylosis. Diffuse facet arthropathy with overgrowth causing multilevel mid thoracic foraminal narrowings bilaterally. Trace right pleural effusion. Small hiatal hernia. No traumatic findings in the visualized posterior mediastinum. Right adrenal adenoma. IMPRESSION: 1. T9 anterior inferior body fracture with mild fracture widening. 2. Chronic, incompletely healed T12 compression fracture with advanced height loss and accelerated disc degeneration with kyphosis. 3. Diffuse spondylosis and facet arthropathy. Electronically Signed   By: Marnee Spring M.D.   On: 12/11/2014 16:04   I have personally reviewed and evaluated these images and  lab results as part of my medical decision-making.   EKG Interpretation None      MDM   Final diagnoses:  T9 vertebral fracture, closed, initial encounter    81 yo F with a chief complaint of midthoracic back pain. Patient with significant tenderness on palpation, x-rays negative. Will obtain a CT scan to further evaluate. Patient is on Coumadin for atrial fibrillation. No noted lower extremity weakness or sensory deficits. Without blunt injury to area of pain feel that epidural hematoma is unlikely.  CT scan with T9 vertebral body fracture. Patient's pain is well controlled when she is not moving. Will discuss the case with neurosurgery.  Dr. Yetta Barre for neurosurgery recommends [facility]SO, follow up with him in clinic.  9:00 PM:  I have discussed the diagnosis/risks/treatment options with the patient and family and believe the pt to be eligible for discharge home to follow-up with Neurosurgery, PCP. We also discussed returning to the ED immediately if new  or worsening sx occur. We discussed the sx which are most concerning (e.g., sudden worsening pain, fever, cauda equina) that necessitate immediate return. Medications administered to the patient during their visit and any new prescriptions provided to the patient are listed below.  Medications given during this visit Medications  ondansetron (ZOFRAN-ODT) disintegrating tablet 4 mg (4 mg Oral Not Given 12/11/14 1727)  diazepam (VALIUM) tablet 2 mg (2 mg Oral Given 12/11/14 1406)  ibuprofen (ADVIL,MOTRIN) tablet 600 mg (600 mg Oral Given 12/11/14 1406)  acetaminophen (TYLENOL) tablet 1,000 mg (1,000 mg Oral Given 12/11/14 1406)  oxyCODONE (Oxy IR/ROXICODONE) immediate release tablet 2.5 mg (2.5 mg Oral Given 12/11/14 1931)    Discharge Medication List as of 12/11/2014  7:19 PM    START taking these medications   Details  oxyCODONE (ROXICODONE) 5 MG immediate release tablet Take 0.5 tablets (2.5 mg total) by mouth every 4 (four) hours as needed  for severe pain., Starting 12/11/2014, Until Discontinued, Print    oxyCODONE-acetaminophen (PERCOCET) 5-325 MG tablet Take 0.5 tablets by mouth every 4 (four) hours as needed., Starting 12/11/2014, Until Discontinued, Print        The patient appears reasonably screen and/or stabilized for discharge and I doubt any other medical condition or other [facility] requiring further screening, evaluation, or treatment in the ED at this time prior to discharge.    Melene Plan, DO 12/11/14 2100

## 2014-12-11 NOTE — ED Notes (Addendum)
Attempted to call report to [facility], unable to get anyone to phone to give report. Tried x 2. Gave discharge instructions to family with discharge instructions. Family member verbalized understanding. Patient discharged with family to transport back [facility].

## 2014-12-11 NOTE — ED Notes (Signed)
Pt waiting to be fitted for brace. Meal tray given. Family request pt to have pain medication dispensed when discharged. States she lives in a nursing home and she will not any pain medication until tomorrow

## 2014-12-11 NOTE — ED Notes (Signed)
Pt awake now and declined Zofran. Family request pt have something to eat. Pt waiting to have consult by neurosurgery

## 2014-12-11 NOTE — ED Notes (Signed)
Patient reports slipping off bed on Monday and fell in the floor on her "rear end". Denies hitting head. Daughter reports patient was walking yesterday without problems.

## 2014-12-11 NOTE — ED Notes (Signed)
LSO brace applied by ortho.

## 2014-12-11 NOTE — Discharge Instructions (Signed)
Take 600 mg of ibuprofen 3 times a day. Take 500 mg of Tylenol 4 times a day. Take the narcotic pain medicine only if needed. Vertebral Fracture A vertebral fracture is a break in one of the bones that make up the spine (vertebrae). The vertebrae are stacked on top of each other to form the spinal column. They support the body and protect the spinal cord. The vertebral column has an upper part (cervical spine), a middle part (thoracic spine), and a lower part (lumbar spine). Most vertebral fractures occur in the thoracic spine or lumbar spine. There are three main types of vertebral fractures:  Flexion fracture. This happens when vertebrae collapse. Vertebrae can collapse:  In the front (compression fracture). This type of fracture is common in people who have a condition that causes their bones to be weak and brittle (osteoporosis). The fracture can make a person lose height.  In the front and back (axial burst fracture).  Extension fracture. This happens when an external force pulls apart the vertebrae.  Rotation fracture. This happens when the spine bends extremely in one direction. This type can cause a piece of a vertebra to break off (transverse process fracture) or move out of its normal position (fracture dislocation). This type of fracture has a high risk for spinal cord injury. Vertebral fractures can range from mild to very severe. The most severe types are those that cause the broken bones to move out of place (unstable) and those that injure or press on the spinal cord. CAUSES This condition is usually caused by a forceful injury. This type of injury commonly results from:  Car accidents.  Falling or jumping from a great height.  Collisions in contact sports.  Violent acts, such as an assault or a gunshot wound. RISK FACTORS This injury is more likely to happen to people who:  Have osteoporosis.  Participate in contact sports.  Are in situations that could result in falls  or other violent injuries. SYMPTOMS Symptoms of this injury depend on the location and the type of fracture. The most common symptom is back pain that gets worse with movement. You may also have trouble standing or walking. If a fracture has damaged your spinal cord or is pressing on it, you may also have:  Numbness.  Tingling.  Weakness.  Loss of movement.  Loss of bowel or bladder control. DIAGNOSIS This injury may be diagnosed based on symptoms, medical history, and a physical exam. You may also have imaging tests to confirm the diagnosis. These may include:  Spine X-ray.  CT scan.  MRI. TREATMENT Treatment for this injury depends on the type of fracture. If your fracture is stable and does not affect your spinal cord, it may heal with nonsurgical treatment, such as:  Taking pain medicine.  Wearing a cast or a brace.  Doing physical therapy exercises. If your vertebral fracture is unstable or it affects your spinal cord, you may need surgical treatment, such as:  Laminectomy. This procedure involves removing the part of a vertebra that is pushing on the spinal cord (spinal decompression surgery). Bone fragments may also be removed.  Spinal fusion. This procedure is used to stabilize an unstable fracture. Vertebrae may be joined together with a piece of bone from another part of your body (graft) and held in place with rods, plates, or screws.  Vertebroplasty. In this procedure, bone cement is used to rebuild collapsed vertebrae. HOME CARE INSTRUCTIONS General Instructions  Take medicines only as directed by your  health care provider.  Do not drive or operate heavy machinery while taking pain medicine.  If directed, apply ice to the injured area:  Put ice in a plastic bag.  Place a towel between your skin and the bag.  Leave the ice on for 30 minutes every two hours at first. Then apply the ice as needed.  Wear your neck brace or back brace as directed by your  health care provider.  Do not drink alcohol. Alcohol can interfere with your treatment.  Keep all follow-up visits as directed by your health care provider. This is important. It can help to prevent permanent injury, disability, and long-lasting (chronic) pain. Activity  Stay in bed (on bed rest) only as directed by your health care provider. Being on bed rest for too long can make your condition worse.  Return to your normal activities as directed by your health care provider. Ask what activities are safe for you.  Do exercises to improve motion and strength in your back (physical therapy), as recommended by your health care provider.   Exercise regularly as directed by your health care provider. SEEK MEDICAL CARE IF:  You have a fever.  You develop a cough that makes your pain worse.  Your pain medicine is not helping.  Your pain does not get better over time.  You cannot return to your normal activities as planned or expected. SEEK IMMEDIATE MEDICAL CARE IF:  Your pain is very bad and it suddenly gets worse.  You are unable to move any body part (paralysis) that is below the level of your injury.  You have numbness, tingling, or weakness in any body part that is below the level of your injury.  You cannot control your bladder or bowels.   This information is not intended to replace advice given to you by your health care provider. Make sure you discuss any questions you have with your health care provider.   Document Released: 03/17/2004 Document Revised: 06/24/2014 Document Reviewed: 02/12/2014 Elsevier Interactive Patient Education [employer]

## 2014-12-11 NOTE — ED Notes (Signed)
Pt continue to sleep. Family at bedside

## 2015-01-09 DIAGNOSIS — S22079A Unspecified fracture of T9-T10 vertebra, initial encounter for closed fracture: Secondary | ICD-10-CM | POA: Diagnosis not present

## 2015-01-19 DIAGNOSIS — R260 Ataxic gait: Secondary | ICD-10-CM | POA: Diagnosis not present

## 2015-01-19 DIAGNOSIS — M170 Bilateral primary osteoarthritis of knee: Secondary | ICD-10-CM | POA: Diagnosis not present

## 2015-01-19 DIAGNOSIS — M6281 Muscle weakness (generalized): Secondary | ICD-10-CM | POA: Diagnosis not present

## 2015-01-19 DIAGNOSIS — M8008XD Age-related osteoporosis with current pathological fracture, vertebra(e), subsequent encounter for fracture with routine healing: Secondary | ICD-10-CM | POA: Diagnosis not present

## 2015-01-19 DIAGNOSIS — R262 Difficulty in walking, not elsewhere classified: Secondary | ICD-10-CM | POA: Diagnosis not present

## 2015-01-19 DIAGNOSIS — S22009S Unspecified fracture of unspecified thoracic vertebra, sequela: Secondary | ICD-10-CM | POA: Diagnosis not present

## 2015-01-19 DIAGNOSIS — M546 Pain in thoracic spine: Secondary | ICD-10-CM | POA: Diagnosis not present

## 2015-01-20 DIAGNOSIS — M546 Pain in thoracic spine: Secondary | ICD-10-CM | POA: Diagnosis not present

## 2015-01-20 DIAGNOSIS — M6281 Muscle weakness (generalized): Secondary | ICD-10-CM | POA: Diagnosis not present

## 2015-01-20 DIAGNOSIS — R262 Difficulty in walking, not elsewhere classified: Secondary | ICD-10-CM | POA: Diagnosis not present

## 2015-01-20 DIAGNOSIS — S22009S Unspecified fracture of unspecified thoracic vertebra, sequela: Secondary | ICD-10-CM | POA: Diagnosis not present

## 2015-01-21 DIAGNOSIS — I482 Chronic atrial fibrillation: Secondary | ICD-10-CM | POA: Diagnosis not present

## 2015-01-22 DIAGNOSIS — M6281 Muscle weakness (generalized): Secondary | ICD-10-CM | POA: Diagnosis not present

## 2015-01-22 DIAGNOSIS — Z9181 History of falling: Secondary | ICD-10-CM | POA: Diagnosis not present

## 2015-01-22 DIAGNOSIS — M170 Bilateral primary osteoarthritis of knee: Secondary | ICD-10-CM | POA: Diagnosis not present

## 2015-01-22 DIAGNOSIS — Z7901 Long term (current) use of anticoagulants: Secondary | ICD-10-CM | POA: Diagnosis not present

## 2015-01-22 DIAGNOSIS — M8008XD Age-related osteoporosis with current pathological fracture, vertebra(e), subsequent encounter for fracture with routine healing: Secondary | ICD-10-CM | POA: Diagnosis not present

## 2015-01-22 DIAGNOSIS — I4891 Unspecified atrial fibrillation: Secondary | ICD-10-CM | POA: Diagnosis not present

## 2015-01-22 DIAGNOSIS — I10 Essential (primary) hypertension: Secondary | ICD-10-CM | POA: Diagnosis not present

## 2015-01-22 DIAGNOSIS — R260 Ataxic gait: Secondary | ICD-10-CM | POA: Diagnosis not present

## 2015-01-22 DIAGNOSIS — F0390 Unspecified dementia without behavioral disturbance: Secondary | ICD-10-CM | POA: Diagnosis not present

## 2015-01-27 DIAGNOSIS — F0390 Unspecified dementia without behavioral disturbance: Secondary | ICD-10-CM | POA: Diagnosis not present

## 2015-01-27 DIAGNOSIS — R260 Ataxic gait: Secondary | ICD-10-CM | POA: Diagnosis not present

## 2015-01-27 DIAGNOSIS — I10 Essential (primary) hypertension: Secondary | ICD-10-CM | POA: Diagnosis not present

## 2015-01-27 DIAGNOSIS — Z7901 Long term (current) use of anticoagulants: Secondary | ICD-10-CM | POA: Diagnosis not present

## 2015-01-27 DIAGNOSIS — Z9181 History of falling: Secondary | ICD-10-CM | POA: Diagnosis not present

## 2015-01-27 DIAGNOSIS — M6281 Muscle weakness (generalized): Secondary | ICD-10-CM | POA: Diagnosis not present

## 2015-01-27 DIAGNOSIS — M8008XD Age-related osteoporosis with current pathological fracture, vertebra(e), subsequent encounter for fracture with routine healing: Secondary | ICD-10-CM | POA: Diagnosis not present

## 2015-01-27 DIAGNOSIS — I4891 Unspecified atrial fibrillation: Secondary | ICD-10-CM | POA: Diagnosis not present

## 2015-01-27 DIAGNOSIS — M170 Bilateral primary osteoarthritis of knee: Secondary | ICD-10-CM | POA: Diagnosis not present

## 2015-01-29 DIAGNOSIS — M6281 Muscle weakness (generalized): Secondary | ICD-10-CM | POA: Diagnosis not present

## 2015-01-29 DIAGNOSIS — F0390 Unspecified dementia without behavioral disturbance: Secondary | ICD-10-CM | POA: Diagnosis not present

## 2015-01-29 DIAGNOSIS — I4891 Unspecified atrial fibrillation: Secondary | ICD-10-CM | POA: Diagnosis not present

## 2015-01-29 DIAGNOSIS — I10 Essential (primary) hypertension: Secondary | ICD-10-CM | POA: Diagnosis not present

## 2015-01-29 DIAGNOSIS — Z9181 History of falling: Secondary | ICD-10-CM | POA: Diagnosis not present

## 2015-01-29 DIAGNOSIS — M8008XD Age-related osteoporosis with current pathological fracture, vertebra(e), subsequent encounter for fracture with routine healing: Secondary | ICD-10-CM | POA: Diagnosis not present

## 2015-01-29 DIAGNOSIS — R260 Ataxic gait: Secondary | ICD-10-CM | POA: Diagnosis not present

## 2015-01-29 DIAGNOSIS — Z7901 Long term (current) use of anticoagulants: Secondary | ICD-10-CM | POA: Diagnosis not present

## 2015-01-29 DIAGNOSIS — M170 Bilateral primary osteoarthritis of knee: Secondary | ICD-10-CM | POA: Diagnosis not present

## 2015-02-02 DIAGNOSIS — Z7901 Long term (current) use of anticoagulants: Secondary | ICD-10-CM | POA: Diagnosis not present

## 2015-02-02 DIAGNOSIS — M8008XD Age-related osteoporosis with current pathological fracture, vertebra(e), subsequent encounter for fracture with routine healing: Secondary | ICD-10-CM | POA: Diagnosis not present

## 2015-02-02 DIAGNOSIS — M6281 Muscle weakness (generalized): Secondary | ICD-10-CM | POA: Diagnosis not present

## 2015-02-02 DIAGNOSIS — Z9181 History of falling: Secondary | ICD-10-CM | POA: Diagnosis not present

## 2015-02-02 DIAGNOSIS — M170 Bilateral primary osteoarthritis of knee: Secondary | ICD-10-CM | POA: Diagnosis not present

## 2015-02-02 DIAGNOSIS — R260 Ataxic gait: Secondary | ICD-10-CM | POA: Diagnosis not present

## 2015-02-02 DIAGNOSIS — I10 Essential (primary) hypertension: Secondary | ICD-10-CM | POA: Diagnosis not present

## 2015-02-02 DIAGNOSIS — F0390 Unspecified dementia without behavioral disturbance: Secondary | ICD-10-CM | POA: Diagnosis not present

## 2015-02-02 DIAGNOSIS — I4891 Unspecified atrial fibrillation: Secondary | ICD-10-CM | POA: Diagnosis not present

## 2015-02-03 DIAGNOSIS — S22079A Unspecified fracture of T9-T10 vertebra, initial encounter for closed fracture: Secondary | ICD-10-CM | POA: Diagnosis not present

## 2015-02-04 DIAGNOSIS — M1990 Unspecified osteoarthritis, unspecified site: Secondary | ICD-10-CM | POA: Diagnosis not present

## 2015-02-04 DIAGNOSIS — M816 Localized osteoporosis [Lequesne]: Secondary | ICD-10-CM | POA: Diagnosis not present

## 2015-02-04 DIAGNOSIS — R260 Ataxic gait: Secondary | ICD-10-CM | POA: Diagnosis not present

## 2015-02-04 DIAGNOSIS — M6281 Muscle weakness (generalized): Secondary | ICD-10-CM | POA: Diagnosis not present

## 2015-02-04 DIAGNOSIS — F0390 Unspecified dementia without behavioral disturbance: Secondary | ICD-10-CM | POA: Diagnosis not present

## 2015-02-04 DIAGNOSIS — I482 Chronic atrial fibrillation: Secondary | ICD-10-CM | POA: Diagnosis not present

## 2015-02-05 DIAGNOSIS — I4891 Unspecified atrial fibrillation: Secondary | ICD-10-CM | POA: Diagnosis not present

## 2015-02-05 DIAGNOSIS — F0390 Unspecified dementia without behavioral disturbance: Secondary | ICD-10-CM | POA: Diagnosis not present

## 2015-02-05 DIAGNOSIS — Z7901 Long term (current) use of anticoagulants: Secondary | ICD-10-CM | POA: Diagnosis not present

## 2015-02-05 DIAGNOSIS — I10 Essential (primary) hypertension: Secondary | ICD-10-CM | POA: Diagnosis not present

## 2015-02-05 DIAGNOSIS — R260 Ataxic gait: Secondary | ICD-10-CM | POA: Diagnosis not present

## 2015-02-05 DIAGNOSIS — M170 Bilateral primary osteoarthritis of knee: Secondary | ICD-10-CM | POA: Diagnosis not present

## 2015-02-05 DIAGNOSIS — M8008XD Age-related osteoporosis with current pathological fracture, vertebra(e), subsequent encounter for fracture with routine healing: Secondary | ICD-10-CM | POA: Diagnosis not present

## 2015-02-05 DIAGNOSIS — Z9181 History of falling: Secondary | ICD-10-CM | POA: Diagnosis not present

## 2015-02-05 DIAGNOSIS — M6281 Muscle weakness (generalized): Secondary | ICD-10-CM | POA: Diagnosis not present

## 2015-02-09 DIAGNOSIS — F0390 Unspecified dementia without behavioral disturbance: Secondary | ICD-10-CM | POA: Diagnosis not present

## 2015-02-09 DIAGNOSIS — Z9181 History of falling: Secondary | ICD-10-CM | POA: Diagnosis not present

## 2015-02-09 DIAGNOSIS — R260 Ataxic gait: Secondary | ICD-10-CM | POA: Diagnosis not present

## 2015-02-09 DIAGNOSIS — I10 Essential (primary) hypertension: Secondary | ICD-10-CM | POA: Diagnosis not present

## 2015-02-09 DIAGNOSIS — M8008XD Age-related osteoporosis with current pathological fracture, vertebra(e), subsequent encounter for fracture with routine healing: Secondary | ICD-10-CM | POA: Diagnosis not present

## 2015-02-09 DIAGNOSIS — M6281 Muscle weakness (generalized): Secondary | ICD-10-CM | POA: Diagnosis not present

## 2015-02-09 DIAGNOSIS — I4891 Unspecified atrial fibrillation: Secondary | ICD-10-CM | POA: Diagnosis not present

## 2015-02-09 DIAGNOSIS — Z7901 Long term (current) use of anticoagulants: Secondary | ICD-10-CM | POA: Diagnosis not present

## 2015-02-09 DIAGNOSIS — M170 Bilateral primary osteoarthritis of knee: Secondary | ICD-10-CM | POA: Diagnosis not present

## 2015-02-11 DIAGNOSIS — Z9181 History of falling: Secondary | ICD-10-CM | POA: Diagnosis not present

## 2015-02-11 DIAGNOSIS — F0390 Unspecified dementia without behavioral disturbance: Secondary | ICD-10-CM | POA: Diagnosis not present

## 2015-02-11 DIAGNOSIS — M8008XD Age-related osteoporosis with current pathological fracture, vertebra(e), subsequent encounter for fracture with routine healing: Secondary | ICD-10-CM | POA: Diagnosis not present

## 2015-02-11 DIAGNOSIS — M170 Bilateral primary osteoarthritis of knee: Secondary | ICD-10-CM | POA: Diagnosis not present

## 2015-02-11 DIAGNOSIS — M6281 Muscle weakness (generalized): Secondary | ICD-10-CM | POA: Diagnosis not present

## 2015-02-11 DIAGNOSIS — Z7901 Long term (current) use of anticoagulants: Secondary | ICD-10-CM | POA: Diagnosis not present

## 2015-02-11 DIAGNOSIS — R260 Ataxic gait: Secondary | ICD-10-CM | POA: Diagnosis not present

## 2015-02-11 DIAGNOSIS — I4891 Unspecified atrial fibrillation: Secondary | ICD-10-CM | POA: Diagnosis not present

## 2015-02-11 DIAGNOSIS — I10 Essential (primary) hypertension: Secondary | ICD-10-CM | POA: Diagnosis not present

## 2015-02-17 DIAGNOSIS — Z9181 History of falling: Secondary | ICD-10-CM | POA: Diagnosis not present

## 2015-02-17 DIAGNOSIS — M6281 Muscle weakness (generalized): Secondary | ICD-10-CM | POA: Diagnosis not present

## 2015-02-17 DIAGNOSIS — R260 Ataxic gait: Secondary | ICD-10-CM | POA: Diagnosis not present

## 2015-02-17 DIAGNOSIS — I4891 Unspecified atrial fibrillation: Secondary | ICD-10-CM | POA: Diagnosis not present

## 2015-02-17 DIAGNOSIS — M170 Bilateral primary osteoarthritis of knee: Secondary | ICD-10-CM | POA: Diagnosis not present

## 2015-02-17 DIAGNOSIS — F0390 Unspecified dementia without behavioral disturbance: Secondary | ICD-10-CM | POA: Diagnosis not present

## 2015-02-17 DIAGNOSIS — Z7901 Long term (current) use of anticoagulants: Secondary | ICD-10-CM | POA: Diagnosis not present

## 2015-02-17 DIAGNOSIS — M8008XD Age-related osteoporosis with current pathological fracture, vertebra(e), subsequent encounter for fracture with routine healing: Secondary | ICD-10-CM | POA: Diagnosis not present

## 2015-02-17 DIAGNOSIS — I10 Essential (primary) hypertension: Secondary | ICD-10-CM | POA: Diagnosis not present

## 2015-02-19 DIAGNOSIS — M6281 Muscle weakness (generalized): Secondary | ICD-10-CM | POA: Diagnosis not present

## 2015-02-19 DIAGNOSIS — I4891 Unspecified atrial fibrillation: Secondary | ICD-10-CM | POA: Diagnosis not present

## 2015-02-19 DIAGNOSIS — F0390 Unspecified dementia without behavioral disturbance: Secondary | ICD-10-CM | POA: Diagnosis not present

## 2015-02-19 DIAGNOSIS — I10 Essential (primary) hypertension: Secondary | ICD-10-CM | POA: Diagnosis not present

## 2015-02-19 DIAGNOSIS — M170 Bilateral primary osteoarthritis of knee: Secondary | ICD-10-CM | POA: Diagnosis not present

## 2015-02-19 DIAGNOSIS — Z7901 Long term (current) use of anticoagulants: Secondary | ICD-10-CM | POA: Diagnosis not present

## 2015-02-19 DIAGNOSIS — M8008XD Age-related osteoporosis with current pathological fracture, vertebra(e), subsequent encounter for fracture with routine healing: Secondary | ICD-10-CM | POA: Diagnosis not present

## 2015-02-19 DIAGNOSIS — Z9181 History of falling: Secondary | ICD-10-CM | POA: Diagnosis not present

## 2015-02-19 DIAGNOSIS — R260 Ataxic gait: Secondary | ICD-10-CM | POA: Diagnosis not present

## 2015-02-20 ENCOUNTER — Other Ambulatory Visit (HOSPITAL_COMMUNITY)
Admission: RE | Admit: 2015-02-20 | Discharge: 2015-02-20 | Disposition: A | Discharge status: Home / Self Care | Payer: Medicare PPO | Source: Other Acute Inpatient Hospital | Attending: Internal Medicine | Admitting: Internal Medicine

## 2015-02-20 DIAGNOSIS — N390 Urinary tract infection, site not specified: Secondary | ICD-10-CM | POA: Diagnosis not present

## 2015-02-20 LAB — URINALYSIS, ROUTINE W REFLEX MICROSCOPIC
BILIRUBIN URINE: NEGATIVE
Glucose, UA: NEGATIVE mg/dL
Ketones, ur: NEGATIVE mg/dL
NITRITE: POSITIVE — AB
Protein, ur: NEGATIVE mg/dL
Specific Gravity, Urine: 1.02 (ref 1.005–1.030)
pH: 6 (ref 5.0–8.0)

## 2015-02-20 LAB — URINE MICROSCOPIC-ADD ON: Squamous Epithelial / LPF: NONE SEEN

## 2015-03-11 ENCOUNTER — Other Ambulatory Visit (HOSPITAL_COMMUNITY)
Admission: RE | Admit: 2015-03-11 | Discharge: 2015-03-11 | Disposition: A | Discharge status: Home / Self Care | Payer: Medicare PPO | Source: Skilled Nursing Facility | Attending: Internal Medicine | Admitting: Internal Medicine

## 2015-03-11 DIAGNOSIS — N390 Urinary tract infection, site not specified: Secondary | ICD-10-CM | POA: Insufficient documentation

## 2015-03-11 LAB — URINALYSIS, ROUTINE W REFLEX MICROSCOPIC
Bilirubin Urine: NEGATIVE
GLUCOSE, UA: NEGATIVE mg/dL
Ketones, ur: NEGATIVE mg/dL
NITRITE: NEGATIVE
PH: 6 (ref 5.0–8.0)
PROTEIN: NEGATIVE mg/dL
Specific Gravity, Urine: 1.015 (ref 1.005–1.030)

## 2015-03-11 LAB — URINE MICROSCOPIC-ADD ON

## 2015-03-14 LAB — URINE CULTURE: Culture: >=100000

## 2015-07-02 ENCOUNTER — Other Ambulatory Visit (HOSPITAL_COMMUNITY)
Admission: RE | Admit: 2015-07-02 | Discharge: 2015-07-02 | Disposition: A | Discharge status: Home / Self Care | Payer: Medicare Other | Source: Other Acute Inpatient Hospital | Attending: Internal Medicine | Admitting: Internal Medicine

## 2015-07-02 DIAGNOSIS — R3915 Urgency of urination: Secondary | ICD-10-CM | POA: Insufficient documentation

## 2015-07-02 LAB — URINALYSIS, ROUTINE W REFLEX MICROSCOPIC
Bilirubin Urine: NEGATIVE
GLUCOSE, UA: NEGATIVE mg/dL
Ketones, ur: NEGATIVE mg/dL
Nitrite: POSITIVE — AB
PH: 5.5 (ref 5.0–8.0)
Protein, ur: 30 mg/dL — AB
Specific Gravity, Urine: 1.025 (ref 1.005–1.030)

## 2015-07-02 LAB — URINE MICROSCOPIC-ADD ON

## 2015-07-04 LAB — URINE CULTURE

## 2015-07-21 ENCOUNTER — Encounter (HOSPITAL_COMMUNITY): Payer: Self-pay | Admitting: Emergency Medicine

## 2015-07-21 ENCOUNTER — Emergency Department (HOSPITAL_COMMUNITY): Payer: Medicare Other

## 2015-07-21 ENCOUNTER — Emergency Department (HOSPITAL_COMMUNITY)
Admission: EM | Admit: 2015-07-21 | Discharge: 2015-07-21 | Disposition: A | Discharge status: Home / Self Care | Payer: Medicare Other | Attending: Emergency Medicine | Admitting: Emergency Medicine

## 2015-07-21 DIAGNOSIS — Y999 Unspecified external cause status: Secondary | ICD-10-CM | POA: Diagnosis not present

## 2015-07-21 DIAGNOSIS — I4891 Unspecified atrial fibrillation: Secondary | ICD-10-CM | POA: Insufficient documentation

## 2015-07-21 DIAGNOSIS — E785 Hyperlipidemia, unspecified: Secondary | ICD-10-CM | POA: Diagnosis not present

## 2015-07-21 DIAGNOSIS — W19XXXA Unspecified fall, initial encounter: Secondary | ICD-10-CM | POA: Diagnosis not present

## 2015-07-21 DIAGNOSIS — I10 Essential (primary) hypertension: Secondary | ICD-10-CM | POA: Insufficient documentation

## 2015-07-21 DIAGNOSIS — Y929 Unspecified place or not applicable: Secondary | ICD-10-CM | POA: Insufficient documentation

## 2015-07-21 DIAGNOSIS — S39012A Strain of muscle, fascia and tendon of lower back, initial encounter: Secondary | ICD-10-CM | POA: Diagnosis not present

## 2015-07-21 DIAGNOSIS — Y939 Activity, unspecified: Secondary | ICD-10-CM | POA: Insufficient documentation

## 2015-07-21 DIAGNOSIS — Z79899 Other long term (current) drug therapy: Secondary | ICD-10-CM | POA: Insufficient documentation

## 2015-07-21 DIAGNOSIS — Z7901 Long term (current) use of anticoagulants: Secondary | ICD-10-CM | POA: Insufficient documentation

## 2015-07-21 DIAGNOSIS — M545 Low back pain: Secondary | ICD-10-CM | POA: Diagnosis present

## 2015-07-21 HISTORY — DX: Personal history of (healed) traumatic fracture: Z87.81

## 2015-07-21 MED ORDER — IBUPROFEN 400 MG PO TABS: 400.0000 mg | ORAL_TABLET | Freq: Four times a day (QID) | ORAL | Status: DC | PRN

## 2015-07-21 NOTE — ED Provider Notes (Signed)
CSN: [id number]     Arrival date & time 07/21/15  1629 History  By signing my name below, I, Tamara [name], attest that this documentation has been prepared under the direction and in the presence of No att. providers found.   Electronically Signed: [name], ED Scribe. 07/23/2015. 6:01 PM   Chief Complaint  Patient presents with  . Fall    The history is provided by the patient and a relative. No language interpreter was used.   HPI Comments: Tamara [name] is a 82 y.o. female with PMHx of spinal fracture who presents to the Emergency Department complaining of gradual onset, lower back pain s/p fall today in which she fell backwards while reaching up to grab a coat hanger. No head trauma or LOC noted in the incident. Pt was ambulatory after the incident. Pt walked to lunch and dinner but had "soreness all over" beginning a few hours after the incident. She reports associated left shoulder pain, which is chronic per family member. Pt states her pain has alleviated while waiting in the ED. No other associated symptoms noted. No worsening or alleviating factors noted. Pt denies chest pain, abdominal pain, neck pain, light-headedness, numbness, tingling, weakness, or any other pertinent symptoms.   Past Medical History  Diagnosis Date  . Hypertension   . Atrial fibrillation (HCC)     Onset well before 2004; Negative stress nuclear study in 01/2003  . Osteoporosis   . Acute colitis 09/2011    C. difficile negative  . Chronic anticoagulation   . Hyperlipidemia   . Hiatal hernia     by CT in 2013; also noted was an adrenal adenoma, duodenal lipoma right ovarian cyst, post hysterectomy  . Pneumonia 09/2011    09/2011  . Syncope   . Pericardial effusion   . Hypercalcemia   . Dementia   . History of spinal fracture    Past Surgical History  Procedure Laterality Date  . Abdominal hysterectomy      No oophorectomy  . Appendectomy    . Colonoscopy  2005    Reportedly normal  screening study   Family History  Problem Relation Age of Onset  . Heart attack Father   . Cancer Mother   . Hypertension Father    Social History  Substance Use Topics  . Smoking status: Never Smoker   . Smokeless tobacco: Never Used  . Alcohol Use: No   OB History    Gravida Para Term Preterm AB TAB SAB Ectopic Multiple Living   2 2 2             Review of Systems  Musculoskeletal: Positive for back pain and arthralgias. Negative for neck pain.  Neurological: Negative for weakness and numbness.  All other systems reviewed and are negative.   Allergies  Review of patient's allergies indicates no known allergies.  Home Medications   Prior to Admission medications   Medication Sig Start Date End Date Taking? Authorizing Provider  acetaminophen (TYLENOL) 500 MG tablet Take 1,000 mg by mouth 2 (two) times daily.   Yes Historical Provider, MD  diltiazem (CARDIZEM) 120 MG tablet Take 120 mg by mouth daily. 02/06/13  Yes Historical Provider, MD  docusate sodium 100 MG CAPS Take 100 mg by mouth 2 (two) times daily. 02/26/13  Yes Marianne L York, PA-C  ENSURE (ENSURE) Take 237 mLs by mouth daily.   Yes Historical Provider, MD  escitalopram (LEXAPRO) 10 MG tablet Take 1 tablet (10 mg total) by mouth daily.  05/14/12  Yes Sharon Seller, NP  levothyroxine (SYNTHROID, LEVOTHROID) 100 MCG tablet Take 100 mcg by mouth daily before breakfast.   Yes Historical Provider, MD  memantine (NAMENDA) 10 MG tablet Take 10 mg by mouth 2 (two) times daily. Administered at 8am and 5pm   Yes Historical Provider, MD  omeprazole (PRILOSEC) 40 MG capsule Take 1 capsule (40 mg total) by mouth daily. For heartburn 05/14/12  Yes Sharon Seller, NP  Polyethyl Glycol-Propyl Glycol (SYSTANE OP) Place 1 drop into both eyes 2 (two) times daily. 1 drop into both eyes   Yes Historical Provider, MD  polyethylene glycol (MIRALAX / GLYCOLAX) packet Take 17 g by mouth daily as needed for mild constipation.   Yes  Historical Provider, MD  warfarin (COUMADIN) 4 MG tablet Take 4-6 mg by mouth See admin instructions.  on Sundays, Tuesdays, Wednesdays, Thursdays, Fridays, and Saturdays. Take  on Mondays   Yes Historical Provider, MD  ibuprofen (ADVIL,MOTRIN) 400 MG tablet Take 1 tablet (400 mg total) by mouth every 6 (six) hours as needed for moderate pain. 07/21/15   Marily Memos, MD   BP 109/85 mmHg  Pulse 79  Temp(Src) 98.8 F (37.1 C) (Tympanic)  Resp 16  Wt 150 lb (68.04 kg)  SpO2 98% Physical Exam  Constitutional: She appears well-developed and well-nourished. No distress.  HENT:  Head: Normocephalic and atraumatic.  Eyes: Conjunctivae and EOM are normal.  Neck: Neck supple. No tracheal deviation present.  Cardiovascular: Normal rate, regular rhythm and intact distal pulses.   Pulmonary/Chest: Effort normal. No respiratory distress. She exhibits no tenderness.  Abdominal: Soft. She exhibits no distension. There is no tenderness.  No pelvis pain.   Musculoskeletal: Normal range of motion. She exhibits tenderness.  No L spine, T spine, or C spine midline tenderness. No paraspinal tenderness. Lumbar paraspinal pain with movement and standing. Peeling skin on left elbow.   Neurological: She is alert.  Skin: Skin is warm and dry.  Psychiatric: She has a normal mood and affect. Her behavior is normal.    ED Course  Procedures (including critical care time) DIAGNOSTIC STUDIES: Oxygen Saturation is 98% on RA, normal by my interpretation.    COORDINATION OF CARE: 9:47 PM Discussed treatment plan which includes DG lumbar spine 2-3 views and DG thoracic spine 2 views with pt at bedside and pt agreed to plan.  Labs Review Labs Reviewed - No data to display  Imaging Review Dg Thoracic Spine 2 View  07/21/2015  CLINICAL DATA:  Status post fall, with upper back pain. Initial encounter. EXAM: THORACIC SPINE 2 VIEWS COMPARISON:  Thoracic spine radiographs performed 02/03/2015 FINDINGS: There is  a mild chronic compression deformity at the lower thoracic spine, stable in appearance. There is no evidence of acute fracture or subluxation along the thoracic spine. Scattered lateral osteophytes are noted along the thoracic spine. IMPRESSION: No evidence of acute fracture or subluxation along the thoracic spine. Mild chronic compression deformity at the lower thoracic spine is stable in appearance. Electronically Signed   By: Roanna Raider M.D.   On: 07/21/2015 23:07   Dg Lumbar Spine 2-3 Views  07/21/2015  CLINICAL DATA:  Status post fall, with lower back pain. Initial encounter. EXAM: LUMBAR SPINE - 2-3 VIEW COMPARISON:  Lumbar spine radiographs performed 12/11/2014 FINDINGS: There is no evidence of acute fracture or subluxation. There is chronic loss of height at T12, stable in appearance. Scattered anterior and lateral osteophytes noted along the lumbar spine. Underlying facet disease is  noted. Vertebral bodies demonstrate normal height and alignment. The visualized bowel gas pattern is unremarkable in appearance; air and stool are noted within the colon. The sacroiliac joints are within normal limits. Scattered vascular calcifications are seen. IMPRESSION: 1. No evidence of acute fracture or subluxation along the lumbar spine. 2. Stable chronic loss of height at T12. Degenerative change noted along the lumbar spine. Electronically Signed   By: Roanna RaiderJeffery  Chang M.D.   On: 07/21/2015 23:06   I have personally reviewed and evaluated these images as part of my medical decision-making.   EKG Interpretation None      MDM   Final diagnoses:  Lumbar strain, initial encounter    82 year old female who fell earlier today and was initially complaining of some back pain. Pain is resolved at this point as far as midline tenderness however she has extensive history of osteoporosis and fractures with minimal trauma so x-rays were done which were negative. Patient able to stand and bear weight without other  pain. Able to ambulate somewhat but stiff. Did not rx muscle relaxant 2/2 fall risk.   New Prescriptions: Discharge Medication List as of 07/21/2015 11:17 PM    START taking these medications   Details  ibuprofen (ADVIL,MOTRIN) 400 MG tablet Take 1 tablet (400 mg total) by mouth every 6 (six) hours as needed for moderate pain., Starting 07/21/2015, Until Discontinued, Print         I have personally and contemperaneously reviewed labs and imaging and used in my decision making as above.   A medical screening exam was performed and I feel the patient has had an appropriate workup for their chief complaint at this time and likelihood of emergent condition existing is low and thus workup can continue on an outpatient basis.. Their vital signs are stable. They have been counseled on decision, discharge, follow up and which symptoms necessitate immediate return to the emergency department.  They verbally stated understanding and agreement with plan and discharged in stable condition.      I personally performed the services described in this documentation, which was scribed in my presence. The recorded information has been reviewed and is accurate.     Marily MemosJason Kiwana Deblasi, MD 07/23/15 [phone]

## 2015-07-21 NOTE — ED Notes (Signed)
Pt fell from a standing position. Pt complaining of back pain as well as left shoulder, elbow, & hip hurting. Pt is taking coumadin

## 2015-07-21 NOTE — ED Notes (Signed)
Pt carried out to car in wheelchair. Family member given discharge paperwork. [facility] and advised of results & informed pt on her way back to facility.

## 2015-07-21 NOTE — ED Notes (Signed)
[facility] from [facility] escorted by family member. States earlier this morning she fell back while getting her coat off a hook. Denies hitting head or LOC. Pt hx of spinal fractures. No neuro deficits noted.

## 2016-01-11 ENCOUNTER — Emergency Department (HOSPITAL_COMMUNITY): Payer: Medicare Other

## 2016-01-11 ENCOUNTER — Inpatient Hospital Stay (HOSPITAL_COMMUNITY)
Admission: EM | Admit: 2016-01-11 | Discharge: 2016-01-13 | DRG: 689 | Disposition: A | Discharge status: Home Health | Payer: Medicare Other | Attending: Family Medicine | Admitting: Family Medicine

## 2016-01-11 ENCOUNTER — Encounter (HOSPITAL_COMMUNITY): Payer: Self-pay

## 2016-01-11 DIAGNOSIS — E039 Hypothyroidism, unspecified: Secondary | ICD-10-CM | POA: Diagnosis present

## 2016-01-11 DIAGNOSIS — I4891 Unspecified atrial fibrillation: Secondary | ICD-10-CM | POA: Diagnosis present

## 2016-01-11 DIAGNOSIS — I482 Chronic atrial fibrillation: Secondary | ICD-10-CM | POA: Diagnosis not present

## 2016-01-11 DIAGNOSIS — N390 Urinary tract infection, site not specified: Secondary | ICD-10-CM | POA: Diagnosis present

## 2016-01-11 DIAGNOSIS — E785 Hyperlipidemia, unspecified: Secondary | ICD-10-CM | POA: Diagnosis present

## 2016-01-11 DIAGNOSIS — I119 Hypertensive heart disease without heart failure: Secondary | ICD-10-CM | POA: Diagnosis present

## 2016-01-11 DIAGNOSIS — G92 Toxic encephalopathy: Secondary | ICD-10-CM | POA: Diagnosis present

## 2016-01-11 DIAGNOSIS — K219 Gastro-esophageal reflux disease without esophagitis: Secondary | ICD-10-CM | POA: Diagnosis present

## 2016-01-11 DIAGNOSIS — I10 Essential (primary) hypertension: Secondary | ICD-10-CM | POA: Diagnosis present

## 2016-01-11 DIAGNOSIS — Y929 Unspecified place or not applicable: Secondary | ICD-10-CM

## 2016-01-11 DIAGNOSIS — G9340 Encephalopathy, unspecified: Secondary | ICD-10-CM | POA: Diagnosis not present

## 2016-01-11 DIAGNOSIS — M810 Age-related osteoporosis without current pathological fracture: Secondary | ICD-10-CM | POA: Diagnosis present

## 2016-01-11 DIAGNOSIS — T368X5A Adverse effect of other systemic antibiotics, initial encounter: Secondary | ICD-10-CM | POA: Diagnosis present

## 2016-01-11 DIAGNOSIS — E876 Hypokalemia: Secondary | ICD-10-CM | POA: Diagnosis present

## 2016-01-11 DIAGNOSIS — Z79899 Other long term (current) drug therapy: Secondary | ICD-10-CM | POA: Diagnosis not present

## 2016-01-11 DIAGNOSIS — R4182 Altered mental status, unspecified: Secondary | ICD-10-CM

## 2016-01-11 DIAGNOSIS — F0390 Unspecified dementia without behavioral disturbance: Secondary | ICD-10-CM | POA: Diagnosis present

## 2016-01-11 DIAGNOSIS — Z8249 Family history of ischemic heart disease and other diseases of the circulatory system: Secondary | ICD-10-CM

## 2016-01-11 DIAGNOSIS — Z7901 Long term (current) use of anticoagulants: Secondary | ICD-10-CM

## 2016-01-11 DIAGNOSIS — N3001 Acute cystitis with hematuria: Secondary | ICD-10-CM | POA: Diagnosis not present

## 2016-01-11 HISTORY — DX: Dorsalgia, unspecified: M54.9

## 2016-01-11 HISTORY — DX: Depression, unspecified: F32.A

## 2016-01-11 HISTORY — DX: Major depressive disorder, single episode, unspecified: F32.9

## 2016-01-11 HISTORY — DX: Disorder of thyroid, unspecified: E07.9

## 2016-01-11 LAB — URINALYSIS, ROUTINE W REFLEX MICROSCOPIC
Bilirubin Urine: NEGATIVE
GLUCOSE, UA: NEGATIVE mg/dL
KETONES UR: NEGATIVE mg/dL
Nitrite: NEGATIVE
PROTEIN: 30 mg/dL — AB
Specific Gravity, Urine: 1.025 (ref 1.005–1.030)
pH: 7 (ref 5.0–8.0)

## 2016-01-11 LAB — CBC WITH DIFFERENTIAL/PLATELET
BASOS ABS: 0 10*3/uL (ref 0.0–0.1)
BASOS PCT: 0 %
EOS ABS: 0 10*3/uL (ref 0.0–0.7)
EOS PCT: 0 %
HCT: 43.8 % (ref 36.0–46.0)
Hemoglobin: 14.3 g/dL (ref 12.0–15.0)
LYMPHS PCT: 9 %
Lymphs Abs: 1 10*3/uL (ref 0.7–4.0)
MCH: 32.1 pg (ref 26.0–34.0)
MCHC: 32.6 g/dL (ref 30.0–36.0)
MCV: 98.2 fL (ref 78.0–100.0)
MONO ABS: 1.3 10*3/uL — AB (ref 0.1–1.0)
Monocytes Relative: 12 %
Neutro Abs: 8.6 10*3/uL — ABNORMAL HIGH (ref 1.7–7.7)
Neutrophils Relative %: 79 %
PLATELETS: 225 10*3/uL (ref 150–400)
RBC: 4.46 MIL/uL (ref 3.87–5.11)
RDW: 13.7 % (ref 11.5–15.5)
WBC: 10.9 10*3/uL — AB (ref 4.0–10.5)

## 2016-01-11 LAB — PROTIME-INR
INR: 2.15
Prothrombin Time: 24.4 seconds — ABNORMAL HIGH (ref 11.4–15.2)

## 2016-01-11 LAB — BASIC METABOLIC PANEL
ANION GAP: 8 (ref 5–15)
BUN: 16 mg/dL (ref 6–20)
CALCIUM: 9.5 mg/dL (ref 8.9–10.3)
CO2: 28 mmol/L (ref 22–32)
Chloride: 102 mmol/L (ref 101–111)
Creatinine, Ser: 0.69 mg/dL (ref 0.44–1.00)
GFR calc Af Amer: >60 mL/min (ref >60)
GLUCOSE: 85 mg/dL (ref 65–99)
POTASSIUM: 4 mmol/L (ref 3.5–5.1)
SODIUM: 138 mmol/L (ref 135–145)

## 2016-01-11 LAB — URINE MICROSCOPIC-ADD ON

## 2016-01-11 LAB — CBG MONITORING, ED: GLUCOSE-CAPILLARY: 101 mg/dL — AB (ref 65–99)

## 2016-01-11 LAB — TSH: TSH: 1.665 u[IU]/mL (ref 0.350–4.500)

## 2016-01-11 LAB — MRSA PCR SCREENING: MRSA by PCR: NEGATIVE

## 2016-01-11 MED ORDER — WARFARIN SODIUM 5 MG PO TABS
6.0000 mg | ORAL_TABLET | Freq: Once | ORAL | Status: AC
  Administered 2016-01-11: 6 mg via ORAL
  Filled 2016-01-11: qty 1

## 2016-01-11 MED ORDER — HYDRALAZINE HCL 20 MG/ML IJ SOLN
5.0000 mg | Freq: Once | INTRAMUSCULAR | Status: AC
  Administered 2016-01-11: 5 mg via INTRAVENOUS
  Filled 2016-01-11: qty 1

## 2016-01-11 MED ORDER — SODIUM CHLORIDE 0.9 % IV BOLUS (SEPSIS)
1000.0000 mL | Freq: Once | INTRAVENOUS | Status: AC
  Administered 2016-01-11: 1000 mL via INTRAVENOUS

## 2016-01-11 MED ORDER — ESCITALOPRAM OXALATE 10 MG PO TABS
10.0000 mg | ORAL_TABLET | Freq: Every day | ORAL | Status: DC
  Administered 2016-01-12 – 2016-01-13 (×2): 10 mg via ORAL
  Filled 2016-01-11 (×2): qty 1

## 2016-01-11 MED ORDER — LEVOTHYROXINE SODIUM 100 MCG PO TABS
100.0000 ug | ORAL_TABLET | Freq: Every day | ORAL | Status: DC
  Administered 2016-01-12 – 2016-01-13 (×2): 100 ug via ORAL
  Filled 2016-01-11 (×2): qty 1

## 2016-01-11 MED ORDER — ACETAMINOPHEN 650 MG RE SUPP: 650.0000 mg | Freq: Four times a day (QID) | RECTAL | Status: DC | PRN

## 2016-01-11 MED ORDER — MEMANTINE HCL 10 MG PO TABS
10.0000 mg | ORAL_TABLET | Freq: Two times a day (BID) | ORAL | Status: DC
  Administered 2016-01-11 – 2016-01-13 (×4): 10 mg via ORAL
  Filled 2016-01-11 (×4): qty 1

## 2016-01-11 MED ORDER — DEXTROSE 5 % IV SOLN
1.0000 g | INTRAVENOUS | Status: DC
  Administered 2016-01-12 – 2016-01-13 (×2): 1 g via INTRAVENOUS
  Filled 2016-01-11 (×4): qty 10

## 2016-01-11 MED ORDER — DOCUSATE SODIUM 100 MG PO CAPS
100.0000 mg | ORAL_CAPSULE | Freq: Two times a day (BID) | ORAL | Status: DC
  Administered 2016-01-11 – 2016-01-13 (×4): 100 mg via ORAL
  Filled 2016-01-11 (×8): qty 1

## 2016-01-11 MED ORDER — ONDANSETRON HCL 4 MG PO TABS: 4.0000 mg | ORAL_TABLET | Freq: Four times a day (QID) | ORAL | Status: DC | PRN

## 2016-01-11 MED ORDER — DILTIAZEM HCL ER 120 MG PO CP24
120.0000 mg | ORAL_CAPSULE | Freq: Every day | ORAL | Status: DC
  Administered 2016-01-12 – 2016-01-13 (×2): 120 mg via ORAL
  Filled 2016-01-11 (×6): qty 1

## 2016-01-11 MED ORDER — SODIUM CHLORIDE 0.9 % IV SOLN
INTRAVENOUS | Status: DC
  Administered 2016-01-11 – 2016-01-12 (×4): via INTRAVENOUS

## 2016-01-11 MED ORDER — POLYETHYLENE GLYCOL 3350 17 G PO PACK: 17.0000 g | PACK | Freq: Every day | ORAL | Status: DC | PRN

## 2016-01-11 MED ORDER — CEFTRIAXONE SODIUM 1 G IJ SOLR
1.0000 g | Freq: Once | INTRAMUSCULAR | Status: AC
  Administered 2016-01-11: 1 g via INTRAVENOUS
  Filled 2016-01-11: qty 10

## 2016-01-11 MED ORDER — WARFARIN - PHARMACIST DOSING INPATIENT
Status: DC
  Administered 2016-01-12: 16:00:00

## 2016-01-11 MED ORDER — WARFARIN SODIUM 2 MG PO TABS: 4.0000 mg | ORAL_TABLET | ORAL | Status: DC

## 2016-01-11 MED ORDER — ACETAMINOPHEN 325 MG PO TABS
650.0000 mg | ORAL_TABLET | Freq: Four times a day (QID) | ORAL | Status: DC | PRN
  Administered 2016-01-12 (×2): 650 mg via ORAL
  Filled 2016-01-11 (×2): qty 2

## 2016-01-11 MED ORDER — ONDANSETRON HCL 4 MG/2ML IJ SOLN: 4.0000 mg | Freq: Four times a day (QID) | INTRAMUSCULAR | Status: DC | PRN

## 2016-01-11 MED ORDER — PANTOPRAZOLE SODIUM 40 MG PO TBEC
40.0000 mg | DELAYED_RELEASE_TABLET | Freq: Every day | ORAL | Status: DC
Start: 2016-01-12 — End: 2016-01-13
  Administered 2016-01-12 – 2016-01-13 (×2): 40 mg via ORAL
  Filled 2016-01-11 (×2): qty 1

## 2016-01-11 NOTE — ED Notes (Signed)
Family at bedside. 

## 2016-01-11 NOTE — ED Provider Notes (Addendum)
[facility]-EMERGENCY DEPT Provider Note   CSN: [id number] Arrival date & time: 01/11/16  1030  By signing my name below, I, Majel HomerPeyton Lee, attest that this documentation has been prepared under the direction and in the presence of Donnetta HutchingBrian Osceola Holian, MD . Electronically Signed: Majel HomerPeyton Lee, Scribe. 01/11/2016. 10:54 AM.  History   Chief Complaint Chief Complaint  Patient presents with  . Altered Mental Status   The history is provided by the patient and the EMS personnel. No language interpreter was used.   LEVEL V CAVEAT and Limited ROS due to Altered Mental Status   HPI Comments: Tamara [name] is a 82 y.o. female with PMHx of dementia, brought in by EMS to the Emergency Department [facility] for an evaluation of altered mental status. Per [facility] staff, pt became unresponsive at ~10:00 AM this morning and EMS was called. EMS reports pt was responsive upon their arrival but was confused about the day of the week. She denied any pain but complained of generalized weakness at that time. Pt is currently on antibiotics for a UTI.   Past Medical History:  Diagnosis Date  . Acute colitis 09/2011   C. difficile negative  . Atrial fibrillation (HCC)    Onset well before 2004; Negative stress nuclear study in 01/2003  . Back pain   . Chronic anticoagulation   . Dementia   . Depression   . Hiatal hernia    by CT in 2013; also noted was an adrenal adenoma, duodenal lipoma right ovarian cyst, post hysterectomy  . History of spinal fracture   . Hypercalcemia   . Hyperlipidemia   . Hypertension   . Osteoporosis   . Pericardial effusion   . Pneumonia 09/2011   09/2011  . Syncope   . Thyroid disease    hypothyroid    Patient Active Problem List   Diagnosis Date Noted  . Gram-negative bacteremia 05/28/2014  . UTI (lower urinary tract infection) 05/28/2014  . Sepsis (HCC) 05/28/2014  . Hypokalemia 02/25/2013  . Hematuria 02/25/2013  . Fever 02/24/2013  . Acute bronchitis  02/24/2013  . Back pain 02/24/2013  . Left wrist fracture 02/24/2013  . Influenza due to identified novel influenza A virus with other respiratory manifestations 02/24/2013  . Degenerative joint disease 08/17/2012  . Hypercalcemia 02/29/2012  . Acute pericardial effusion 02/25/2012  . Hypertension   . Chronic anticoagulation   . Hyperlipidemia   . Atrial fibrillation (HCC) 09/28/2011    Past Surgical History:  Procedure Laterality Date  . ABDOMINAL HYSTERECTOMY     No oophorectomy  . APPENDECTOMY    . COLONOSCOPY  2005   Reportedly normal screening study    OB History    Gravida Para Term Preterm AB Living   2 2 2          SAB TAB Ectopic Multiple Live Births                 Home Medications    Prior to Admission medications   Medication Sig Start Date End Date Taking? Authorizing Provider  acetaminophen (TYLENOL) 500 MG tablet Take 1,000 mg by mouth 2 (two) times daily.   Yes Historical Provider, MD  ciprofloxacin (CIPRO) 500 MG tablet 1 tablet 2 (two) times daily. 01/07/16  Yes Historical Provider, MD  diltiazem (CARDIZEM) 120 MG tablet Take 120 mg by mouth daily. 02/06/13  Yes Historical Provider, MD  docusate sodium 100 MG CAPS Take 100 mg by mouth 2 (two) times daily. 02/26/13  Yes  Tora Kindred York, PA-C  ENSURE (ENSURE) Take 237 mLs by mouth daily.   Yes Historical Provider, MD  escitalopram (LEXAPRO) 10 MG tablet Take 1 tablet (10 mg total) by mouth daily. 05/14/12  Yes Sharon Seller, NP  levothyroxine (SYNTHROID, LEVOTHROID) 100 MCG tablet Take 100 mcg by mouth daily before breakfast.   Yes Historical Provider, MD  memantine (NAMENDA) 10 MG tablet Take 10 mg by mouth 2 (two) times daily. Administered at 8am and 5pm   Yes Historical Provider, MD  omeprazole (PRILOSEC) 40 MG capsule Take 1 capsule (40 mg total) by mouth daily. For heartburn 05/14/12  Yes Sharon Seller, NP  Polyethyl Glycol-Propyl Glycol (SYSTANE OP) Place 1 drop into both eyes 2 (two) times daily. 1  drop into both eyes   Yes Historical Provider, MD  warfarin (COUMADIN) 4 MG tablet Take 4-6 mg by mouth See admin instructions. 4mg  on Sundays, Tuesdays, Wednesdays, Thursdays, Fridays, and Saturdays. Take 6mg  on Mondays   Yes Historical Provider, MD  ibuprofen (ADVIL,MOTRIN) 400 MG tablet Take 1 tablet (400 mg total) by mouth every 6 (six) hours as needed for moderate pain. 07/21/15   Marily Memos, MD  polyethylene glycol (MIRALAX / GLYCOLAX) packet Take 17 g by mouth daily as needed for mild constipation.    Historical Provider, MD    Family History Family History  Problem Relation Age of Onset  . Cancer Mother   . Heart attack Father   . Hypertension Father     Social History Social History  Substance Use Topics  . Smoking status: Never Smoker  . Smokeless tobacco: Never Used  . Alcohol use No     Allergies   Patient has no known allergies.   Review of Systems Review of Systems  Unable to perform ROS: Mental status change   Physical Exam Updated Vital Signs BP 120/69 (BP Location: Left Arm)   Pulse 66   Temp 98.1 F (36.7 C) (Oral)   Resp 20   SpO2 100%   Physical Exam  Constitutional: She appears well-developed and well-nourished.  Obtunded but minimally will respond to direct verbal stimuli. Confused.   HENT:  Head: Normocephalic and atraumatic.  Eyes: Conjunctivae are normal.  Neck: Neck supple.  Cardiovascular: Normal rate and regular rhythm.   Pulmonary/Chest: Effort normal and breath sounds normal.  Abdominal: Soft. Bowel sounds are normal.  Musculoskeletal:  Unable. Moving all extremities to command but not very vigorously.   Skin: Skin is warm and dry.  Psychiatric:  Unable   Nursing note and vitals reviewed.  ED Treatments / Results  Labs (all labs ordered are listed, but only abnormal results are displayed) Labs Reviewed  CBC WITH DIFFERENTIAL/PLATELET - Abnormal; Notable for the following:       Result Value   WBC 10.9 (*)    Neutro Abs 8.6  (*)    Monocytes Absolute 1.3 (*)    All other components within normal limits  URINALYSIS, ROUTINE W REFLEX MICROSCOPIC (NOT AT [facility]) - Abnormal; Notable for the following:    APPearance CLOUDY (*)    Hgb urine dipstick LARGE (*)    Protein, ur 30 (*)    Leukocytes, UA SMALL (*)    All other components within normal limits  URINE MICROSCOPIC-ADD ON - Abnormal; Notable for the following:    Squamous Epithelial / LPF 0-5 (*)    Bacteria, UA FEW (*)    All other components within normal limits  CBG MONITORING, ED - Abnormal; Notable for  the following:    Glucose-Capillary 101 (*)    All other components within normal limits  URINE CULTURE  BASIC METABOLIC PANEL    EKG  EKG Interpretation  Date/Time:  Monday January 11 2016 10:40:38 EST Ventricular Rate:  69 PR Interval:    QRS Duration: 75 QT Interval:  426 QTC Calculation: 457 R Axis:   -2 Text Interpretation:  Atrial fibrillation Low voltage, extremity and precordial leads Confirmed by Nettie Cromwell  MD, Dafne Nield ([id number]) on 01/11/2016 11:13:05 AM       Radiology Ct Head Wo Contrast  Result Date: 01/11/2016 CLINICAL DATA:  Altered mental status this morning. EXAM: CT HEAD WITHOUT CONTRAST TECHNIQUE: Contiguous axial images were obtained from the base of the skull through the vertex without intravenous contrast. COMPARISON:  02/24/2013 FINDINGS: Brain: Mild age related volume loss. No acute intracranial abnormality. Specifically, no hemorrhage, hydrocephalus, mass lesion, acute infarction, or significant intracranial injury. Vascular: No hyperdense vessel or unexpected calcification. Skull: No acute calvarial abnormality. Sinuses/Orbits: Visualized paranasal sinuses and mastoids clear. Orbital soft tissues unremarkable. Other:  None IMPRESSION: No acute intracranial abnormality. Electronically Signed   By: Charlett NoseKevin  Dover M.D.   On: 01/11/2016 12:08   Dg Chest Port 1 View  Result Date: 01/11/2016 CLINICAL DATA:  Altered mental status.  EXAM: PORTABLE CHEST 1 VIEW COMPARISON:  12/19/2014 FINDINGS: Chronic prominence of the cardiac silhouette. No acute infiltrates or effusions. Chronic slight accentuation of the interstitial markings. No acute bone abnormality. IMPRESSION: No acute abnormality.  Chronic cardiomegaly. Electronically Signed   By: Francene BoyersJames  Maxwell M.D.   On: 01/11/2016 11:33    Procedures Procedures (including critical care time)  Medications Ordered in ED Medications  cefTRIAXone (ROCEPHIN) 1 g in dextrose 5 % 50 mL IVPB (not administered)  sodium chloride 0.9 % bolus 1,000 mL (1,000 mLs Intravenous New Bag/Given 01/11/16 1130)    DIAGNOSTIC STUDIES:  Oxygen Saturation is 100% on RA, normal by my interpretation.    COORDINATION OF CARE:  10:51 AM Discussed treatment plan with pt at bedside and pt agreed to plan.  Initial Impression / Assessment and Plan / ED Course  I have reviewed the triage vital signs and the nursing notes.  Pertinent labs & imaging results that were available during my care of the patient were reviewed by me and considered in my medical decision making (see chart for details).  Clinical Course     Will obtain basic screening tests.   Urinalysis shows evidence of gross infection. IV fluids, IV Rocephin, urine culture, admit to observation.  I personally performed the services described in this documentation, which was scribed in my presence. The recorded information has been reviewed and is accurate.   Final Clinical Impressions(s) / ED Diagnoses   Final diagnoses:  Altered mental status, unspecified altered mental status type  Urinary tract infection without hematuria, site unspecified    New Prescriptions New Prescriptions   No medications on file     Donnetta HutchingBrian Kylan Veach, MD 01/11/16 1415    Donnetta HutchingBrian Alejandra Hunt, MD 01/11/16 1500

## 2016-01-11 NOTE — Progress Notes (Signed)
ANTICOAGULATION CONSULT NOTE Pharmacy Consult for Warfarin Indication: atrial fibrillation  No Known Allergies  Patient Measurements: Height: 5\' 10"  (177.8 cm) Weight: 162 lb 8 oz (73.7 kg) IBW/kg (Calculated) : 68.5  Vital Signs: Temp: 98.6 F (37 C) (11/20 1609) Temp Source: Oral (11/20 1609) BP: 164/80 (11/20 1400) Pulse Rate: 78 (11/20 1609)  Labs:  Recent Labs  01/11/16 1144 01/11/16 1657  HGB 14.3  --   HCT 43.8  --   PLT 225  --   LABPROT  --  24.4*  INR  --  2.15  CREATININE 0.69  --    Estimated Creatinine Clearance: 52.6 mL/min (by C-G formula based on SCr of 0.69 mg/dL).  Medical History: Past Medical History:  Diagnosis Date  . Acute colitis 09/2011   C. difficile negative  . Atrial fibrillation (HCC)    Onset well before 2004; Negative stress nuclear study in 01/2003  . Back pain   . Chronic anticoagulation   . Dementia   . Depression   . Hiatal hernia    by CT in 2013; also noted was an adrenal adenoma, duodenal lipoma right ovarian cyst, post hysterectomy  . History of spinal fracture   . Hypercalcemia   . Hyperlipidemia   . Hypertension   . Osteoporosis   . Pericardial effusion   . Pneumonia 09/2011   09/2011  . Syncope   . Thyroid disease    hypothyroid   Medications:  Prescriptions Prior to Admission  Medication Sig Dispense Refill Last Dose  . acetaminophen (TYLENOL) 500 MG tablet Take 1,000 mg by mouth 2 (two) times daily.   01/11/2016 at Unknown time  . ciprofloxacin (CIPRO) 500 MG tablet 1 tablet 2 (two) times daily.  0 01/11/2016 at Unknown time  . diltiazem (CARDIZEM) 120 MG tablet Take 120 mg by mouth daily.   01/11/2016 at Unknown time  . docusate sodium 100 MG CAPS Take 100 mg by mouth 2 (two) times daily. 10 capsule 0 01/11/2016 at Unknown time  . ENSURE (ENSURE) Take 237 mLs by mouth daily.   01/10/2016 at Unknown time  . escitalopram (LEXAPRO) 10 MG tablet Take 1 tablet (10 mg total) by mouth daily. 30 tablet 3 01/11/2016 at  Unknown time  . levothyroxine (SYNTHROID, LEVOTHROID) 100 MCG tablet Take 100 mcg by mouth daily before breakfast.   01/11/2016 at Unknown time  . memantine (NAMENDA) 10 MG tablet Take 10 mg by mouth 2 (two) times daily. Administered at 8am and 5pm   01/11/2016 at Unknown time  . omeprazole (PRILOSEC) 40 MG capsule Take 1 capsule (40 mg total) by mouth daily. For heartburn 30 capsule 3 01/11/2016 at Unknown time  . Polyethyl Glycol-Propyl Glycol (SYSTANE OP) Place 1 drop into both eyes 2 (two) times daily. 1 drop into both eyes   01/11/2016 at Unknown time  . warfarin (COUMADIN) 4 MG tablet Take 4-6 mg by mouth See admin instructions. 4mg  on Sundays, Tuesdays, Wednesdays, Thursdays, Fridays, and Saturdays. Take 6mg  on Mondays   01/10/2016 at Unknown time  . ibuprofen (ADVIL,MOTRIN) 400 MG tablet Take 1 tablet (400 mg total) by mouth every 6 (six) hours as needed for moderate pain. 30 tablet 0 01/09/2016  . polyethylene glycol (MIRALAX / GLYCOLAX) packet Take 17 g by mouth daily as needed for mild constipation.   unknown   Assessment: Okay for Protocol.  No bleeding noted.  Last dose at nursing home 11/19 per medication list.  Goal of Therapy:  INR 2-3   Plan:  Warfarin 6mg  PO x 1 Daily PT/INR Monitor for signs and symptoms of bleeding.   Lamonte RicherHayes, Gaspare Netzel R 01/11/2016,6:49 PM

## 2016-01-11 NOTE — ED Notes (Signed)
Lab at bedside

## 2016-01-11 NOTE — H&P (Signed)
History and Physical    YAJAIRA DOFFING [name]:[id number] DOB: [birth year] DOA: 01/11/2016  PCP: Dwana Melena, MD  Patient coming from: [facility]  Chief Complaint: unresponsive  HPI: Tamara [name] is a 82 y.o. female with medical history significant of dementia, A fib on coumadin, was was recently started on oral antibiotics for possible UTI. Details are limited since patient cannot contribute to history (she cannot recall why she was brought to the hospital) and no records/family are present. Per ER records, she became unresponsive today at approximately 10:00am. EMS was called and by their arrival, she was noted to be awake, but confused. Her baseline mental status is not known. She also complained of generalized weakness.  ED Course: in ED, vitals were noted to be stable. UA was indicative of infection. She has been referred for admission due to failing outpatient abx.  Review of Systems: unable to assess due to dementia.    Past Medical History:  Diagnosis Date  . Acute colitis 09/2011   C. difficile negative  . Atrial fibrillation (HCC)    Onset well before 2004; Negative stress nuclear study in 01/2003  . Back pain   . Chronic anticoagulation   . Dementia   . Depression   . Hiatal hernia    by CT in 2013; also noted was an adrenal adenoma, duodenal lipoma right ovarian cyst, post hysterectomy  . History of spinal fracture   . Hypercalcemia   . Hyperlipidemia   . Hypertension   . Osteoporosis   . Pericardial effusion   . Pneumonia 09/2011   09/2011  . Syncope   . Thyroid disease    hypothyroid    Past Surgical History:  Procedure Laterality Date  . ABDOMINAL HYSTERECTOMY     No oophorectomy  . APPENDECTOMY    . COLONOSCOPY  2005   Reportedly normal screening study     reports that she has never smoked. She has never used smokeless tobacco. She reports that she does not drink alcohol or use drugs.  No Known Allergies  Family History  Problem  Relation Age of Onset  . Cancer Mother   . Heart attack Father   . Hypertension Father     Prior to Admission medications   Medication Sig Start Date End Date Taking? Authorizing Provider  acetaminophen (TYLENOL) 500 MG tablet Take 1,000 mg by mouth 2 (two) times daily.   Yes Historical Provider, MD  ciprofloxacin (CIPRO) 500 MG tablet 1 tablet 2 (two) times daily. 01/07/16  Yes Historical Provider, MD  diltiazem (CARDIZEM) 120 MG tablet Take 120 mg by mouth daily. 02/06/13  Yes Historical Provider, MD  docusate sodium 100 MG CAPS Take 100 mg by mouth 2 (two) times daily. 02/26/13  Yes Marianne L York, PA-C  ENSURE (ENSURE) Take 237 mLs by mouth daily.   Yes Historical Provider, MD  escitalopram (LEXAPRO) 10 MG tablet Take 1 tablet (10 mg total) by mouth daily. 05/14/12  Yes Sharon Seller, NP  levothyroxine (SYNTHROID, LEVOTHROID) 100 MCG tablet Take 100 mcg by mouth daily before breakfast.   Yes Historical Provider, MD  memantine (NAMENDA) 10 MG tablet Take 10 mg by mouth 2 (two) times daily. Administered at 8am and 5pm   Yes Historical Provider, MD  omeprazole (PRILOSEC) 40 MG capsule Take 1 capsule (40 mg total) by mouth daily. For heartburn 05/14/12  Yes Sharon Seller, NP  Polyethyl Glycol-Propyl Glycol (SYSTANE OP) Place 1 drop into both eyes 2 (two) times daily. 1  drop into both eyes   Yes Historical Provider, MD  warfarin (COUMADIN) 4 MG tablet Take 4-6 mg by mouth See admin instructions. 4mg  on Sundays, Tuesdays, Wednesdays, Thursdays, Fridays, and Saturdays. Take 6mg  on Mondays   Yes Historical Provider, MD  ibuprofen (ADVIL,MOTRIN) 400 MG tablet Take 1 tablet (400 mg total) by mouth every 6 (six) hours as needed for moderate pain. 07/21/15   Marily MemosJason Mesner, MD  polyethylene glycol (MIRALAX / GLYCOLAX) packet Take 17 g by mouth daily as needed for mild constipation.    Historical Provider, MD    Physical Exam: Vitals:   01/11/16 1400 01/11/16 1515 01/11/16 1545 01/11/16 1609  BP:  164/80     Pulse:  84 86 78  Resp:  22 14   Temp:    98.6 F (37 C)  TempSrc:    Oral  SpO2:  99% 99% 99%  Weight:    73.7 kg (162 lb 8 oz)  Height:    5\' 10"  (1.778 m)      Constitutional: NAD, calm, comfortable Vitals:   01/11/16 1400 01/11/16 1515 01/11/16 1545 01/11/16 1609  BP: 164/80     Pulse:  84 86 78  Resp:  22 14   Temp:    98.6 F (37 C)  TempSrc:    Oral  SpO2:  99% 99% 99%  Weight:    73.7 kg (162 lb 8 oz)  Height:    5\' 10"  (1.778 m)   Eyes: PERRL, lids and conjunctivae normal ENMT: Mucous membranes are moist. Posterior pharynx clear of any exudate or lesions.Normal dentition.  Neck: normal, supple, no masses, no thyromegaly Respiratory: clear to auscultation bilaterally, no wheezing, no crackles. Normal respiratory effort. No accessory muscle use.  Cardiovascular: irregular rate and rhythm, no murmurs / rubs / gallops. No extremity edema. 2+ pedal pulses. No carotid bruits.  Abdomen: no tenderness, no masses palpated. No hepatosplenomegaly. Bowel sounds positive.  Musculoskeletal: no clubbing / cyanosis. No joint deformity upper and lower extremities. Good ROM, no contractures. Normal muscle tone.  Skin: no rashes, lesions, ulcers. No induration Neurologic: CN 2-12 grossly intact. Sensation intact, DTR normal. Strength 5/5 in all 4.  Psychiatric: pleasant, confused   Labs on Admission: I have personally reviewed following labs and imaging studies  CBC:  Recent Labs Lab 01/11/16 1144  WBC 10.9*  NEUTROABS 8.6*  HGB 14.3  HCT 43.8  MCV 98.2  PLT 225   Basic Metabolic Panel:  Recent Labs Lab 01/11/16 1144  NA 138  K 4.0  CL 102  CO2 28  GLUCOSE 85  BUN 16  CREATININE 0.69  CALCIUM 9.5   GFR: Estimated Creatinine Clearance: 52.6 mL/min (by C-G formula based on SCr of 0.69 mg/dL). Liver Function Tests: No results for input(s): AST, ALT, ALKPHOS, BILITOT, PROT, ALBUMIN in the last 168 hours. No results for input(s): LIPASE, AMYLASE in the  last 168 hours. No results for input(s): AMMONIA in the last 168 hours. Coagulation Profile: No results for input(s): INR, PROTIME in the last 168 hours. Cardiac Enzymes: No results for input(s): CKTOTAL, CKMB, CKMBINDEX, TROPONINI in the last 168 hours. BNP (last 3 results) No results for input(s): PROBNP in the last 8760 hours. HbA1C: No results for input(s): HGBA1C in the last 72 hours. CBG:  Recent Labs Lab 01/11/16 1035  GLUCAP 101*   Lipid Profile: No results for input(s): CHOL, HDL, LDLCALC, TRIG, CHOLHDL, LDLDIRECT in the last 72 hours. Thyroid Function Tests: No results for input(s): TSH, T4TOTAL, FREET4,  T3FREE, THYROIDAB in the last 72 hours. Anemia Panel: No results for input(s): VITAMINB12, FOLATE, FERRITIN, TIBC, IRON, RETICCTPCT in the last 72 hours. Urine analysis:    Component Value Date/Time   COLORURINE YELLOW 01/11/2016 1210   APPEARANCEUR CLOUDY (A) 01/11/2016 1210   LABSPEC 1.025 01/11/2016 1210   PHURINE 7.0 01/11/2016 1210   GLUCOSEU NEGATIVE 01/11/2016 1210   HGBUR LARGE (A) 01/11/2016 1210   BILIRUBINUR NEGATIVE 01/11/2016 1210   KETONESUR NEGATIVE 01/11/2016 1210   PROTEINUR 30 (A) 01/11/2016 1210   UROBILINOGEN 0.2 05/28/2014 0845   NITRITE NEGATIVE 01/11/2016 1210   LEUKOCYTESUR SMALL (A) 01/11/2016 1210   Sepsis Labs: !!!!!!!!!!!!!!!!!!!!!!!!!!!!!!!!!!!!!!!!!!!! @LABRCNTIP (procalcitonin:4,lacticidven:4) )No results found for this or any previous visit (from the past 240 hour(s)).   Radiological Exams on Admission: Ct Head Wo Contrast  Result Date: 01/11/2016 CLINICAL DATA:  Altered mental status this morning. EXAM: CT HEAD WITHOUT CONTRAST TECHNIQUE: Contiguous axial images were obtained from the base of the skull through the vertex without intravenous contrast. COMPARISON:  02/24/2013 FINDINGS: Brain: Mild age related volume loss. No acute intracranial abnormality. Specifically, no hemorrhage, hydrocephalus, mass lesion, acute infarction,  or significant intracranial injury. Vascular: No hyperdense vessel or unexpected calcification. Skull: No acute calvarial abnormality. Sinuses/Orbits: Visualized paranasal sinuses and mastoids clear. Orbital soft tissues unremarkable. Other:  None IMPRESSION: No acute intracranial abnormality. Electronically Signed   By: Charlett NoseKevin  Dover M.D.   On: 01/11/2016 12:08   Dg Chest Port 1 View  Result Date: 01/11/2016 CLINICAL DATA:  Altered mental status. EXAM: PORTABLE CHEST 1 VIEW COMPARISON:  12/19/2014 FINDINGS: Chronic prominence of the cardiac silhouette. No acute infiltrates or effusions. Chronic slight accentuation of the interstitial markings. No acute bone abnormality. IMPRESSION: No acute abnormality.  Chronic cardiomegaly. Electronically Signed   By: Francene BoyersJames  Maxwell M.D.   On: 01/11/2016 11:33    Assessment/Plan Active Problems:   Atrial fibrillation (HCC)   Acute encephalopathy   Hypertension   Hyperlipidemia   UTI (urinary tract infection)   Dementia   Hypothyroidism   GERD (gastroesophageal reflux disease)    1. Urinary tract infection. Urinalysis indicates possible infection. She was taking cipro as an outpatient. Will start on rocephin and check urine culture. CT head negative. No other signs of infection. No new medications.  2. Acute encephalopathy superimposed on dementia. Likely related to UTI. Baseline mental status not known. Will continue to follow.   3. A fib. Currently heart rate is contolled. She is anticoagulated with coumadin  4. Hypothyroidism. Continue on synthroid and check TSH  5. HLD. Continue statin  6. GERD. Continue PPI   DVT prophylaxis: coumadin Code Status: no family present, assume full code Family Communication: no family present, unable to reach family over the phone Disposition Plan: [facility] Consults called:  Admission status: inpatient   [facility] Center LLCMEMON,JEHANZEB MD [facility] Pager [phone]  If 7PM-7AM,  please contact night-coverage [email or web address] Password [email or web address]  01/11/2016, 4:37 PM

## 2016-01-11 NOTE — ED Triage Notes (Signed)
Ems reports pt is a resident of [facility] and around 10am staff reports pt was unresponsive.  When EMS arrived, pt was responsive to voice but confused to the day of the week.  Denies any pain but reports generalized weakness and is currently on antibiotics for a UTI.

## 2016-01-12 LAB — COMPREHENSIVE METABOLIC PANEL
ALBUMIN: 3.4 g/dL — AB (ref 3.5–5.0)
ALK PHOS: 66 U/L (ref 38–126)
ALT: 15 U/L (ref 14–54)
AST: 22 U/L (ref 15–41)
Anion gap: 8 (ref 5–15)
BUN: 11 mg/dL (ref 6–20)
CHLORIDE: 106 mmol/L (ref 101–111)
CO2: 26 mmol/L (ref 22–32)
CREATININE: 0.53 mg/dL (ref 0.44–1.00)
Calcium: 8.9 mg/dL (ref 8.9–10.3)
GFR calc Af Amer: >60 mL/min (ref >60)
GFR calc non Af Amer: >60 mL/min (ref >60)
GLUCOSE: 93 mg/dL (ref 65–99)
Potassium: 3 mmol/L — ABNORMAL LOW (ref 3.5–5.1)
SODIUM: 140 mmol/L (ref 135–145)
Total Bilirubin: 1 mg/dL (ref 0.3–1.2)
Total Protein: 6.4 g/dL — ABNORMAL LOW (ref 6.5–8.1)

## 2016-01-12 LAB — PROTIME-INR
INR: 2
Prothrombin Time: 23 seconds — ABNORMAL HIGH (ref 11.4–15.2)

## 2016-01-12 LAB — CBC
HCT: 40.9 % (ref 36.0–46.0)
HEMOGLOBIN: 13.4 g/dL (ref 12.0–15.0)
MCH: 31.9 pg (ref 26.0–34.0)
MCHC: 32.8 g/dL (ref 30.0–36.0)
MCV: 97.4 fL (ref 78.0–100.0)
Platelets: 212 10*3/uL (ref 150–400)
RBC: 4.2 MIL/uL (ref 3.87–5.11)
RDW: 13.8 % (ref 11.5–15.5)
WBC: 7.9 10*3/uL (ref 4.0–10.5)

## 2016-01-12 LAB — URINE CULTURE

## 2016-01-12 MED ORDER — WARFARIN SODIUM 2 MG PO TABS
4.0000 mg | ORAL_TABLET | Freq: Once | ORAL | Status: AC
  Administered 2016-01-12: 4 mg via ORAL
  Filled 2016-01-12: qty 2

## 2016-01-12 MED ORDER — POTASSIUM CHLORIDE CRYS ER 20 MEQ PO TBCR
40.0000 meq | EXTENDED_RELEASE_TABLET | ORAL | Status: AC
  Administered 2016-01-12 (×2): 40 meq via ORAL
  Filled 2016-01-12 (×2): qty 2

## 2016-01-12 NOTE — Care Management Note (Signed)
Case Management Note  Patient Details  Name: Tamara [name] MRN: [id number] Date of Birth: 06/10/1924  Subjective/Objective:   [facility], found unresponsive. Alert today, being treated for UTI.                  Action/Plan: Anticipate patient will DC back to ALF if continues to improve. CSW aware.   Expected Discharge Date:       01/14/2016          / Expected Discharge Plan:  Assisted Living / Rest Home  In-House Referral:  Clinical Social Work  Discharge planning Services  CM Consult  Post Acute Care Choice:    Choice offered to:     DME Arranged:    DME Agency:     HH Arranged:    HH Agency:     Status of Service:  In process, will continue to follow  If discussed at Long Length of Stay Meetings, dates discussed:    Additional Comments:  Bryann Mcnealy, Chrystine OilerSharley Diane, RN 01/12/2016, 9:51 AM

## 2016-01-12 NOTE — Progress Notes (Addendum)
PROGRESS NOTE    Tamara HaggardBetty S Pankratz  WUJ:[id number] DOB: 05/31/1924 DOA: 01/11/2016 PCP: Dwana MelenaZack Hall, MD   Brief Narrative:  [age] yof with a history of dementia, A fib on coumadin, was was recently started on oral antibiotics for possible UTI, presented via EMS with unresponsiveness and generalized weakness. While in the ED, serology was unremarkable except for leukocytosis. UA was indicative of infection. She was admitted for antibiotic failure and urinary tract infection. She can likely go back to ALF once urine culture results available,   Assessment & Plan:   Active Problems:   Atrial fibrillation (HCC)   Acute encephalopathy   Hypertension   Hyperlipidemia   UTI (urinary tract infection)   Dementia   Hypothyroidism   GERD (gastroesophageal reflux disease)  1. Urinary tract infection. Urinalysis indicates possible infection. She was taking cipro as an outpatient, but failed to improve. Will continue on rocephin and check urine cultures when available.  2. Acute encephalopathy superimposed on dementia. Likely related to UTI. Baseline mental status is unknown, although she is not lethargic and can carry on a conversation. I suspect she may be approaching her baseline. CT head negative. No other signs of infection. No new medications.Will continue to follow.  3. A fib. Heart rate remains controlled. She is anticoagulated with coumadin, will continue.  4. Hypothyroidism. TSH is within normal limits, continue on synthroid.  5. HLD. Continue statin 6. GERD. Continue PPI   DVT prophylaxis: Coumadin  Code Status: Full  Family Communication: No family bedside Disposition Plan: Discharge back to ALF once urine culture results available     Consultants:   None   Procedures:  None   Antimicrobials:   Rocephin 11/21 >>    Subjective: Confused, pleasant, denies any complaints  Objective: Vitals:   01/11/16 2200 01/11/16 2354 01/12/16 0009 01/12/16 0607  BP: (!) 175/99 (!)  185/97 (!) 165/88 (!) 162/88  Pulse: 89 60  71  Resp: 18 18  18   Temp: 98.4 F (36.9 C) 98.4 F (36.9 C)  97.9 F (36.6 C)  TempSrc: Oral Oral  Oral  SpO2: 99% 99%  99%  Weight:      Height:        Intake/Output Summary (Last 24 hours) at 01/12/16 0918 Last data filed at 01/11/16 1700  Gross per 24 hour  Intake              240 ml  Output                0 ml  Net              240 ml   Filed Weights   01/11/16 1609  Weight: 73.7 kg (162 lb 8 oz)    Examination:  General exam: Appears calm and comfortable  Respiratory system: Clear to auscultation. Respiratory effort normal. Cardiovascular system: S1 & S2 heard, irregular. No JVD, murmurs, rubs, gallops or clicks. No pedal edema. Gastrointestinal system: Abdomen is nondistended, soft and nontender. No organomegaly or masses felt. Normal bowel sounds heard. Central nervous system: confused. No focal neurological deficits. Extremities: Symmetric 5 x 5 power. Skin: No rashes, lesions or ulcers Psychiatry: confused, pleasant.     Data Reviewed: I have personally reviewed following labs and imaging studies  CBC:  Recent Labs Lab 01/11/16 1144 01/12/16 0542  WBC 10.9* 7.9  NEUTROABS 8.6*  --   HGB 14.3 13.4  HCT 43.8 40.9  MCV 98.2 97.4  PLT 225 212   Basic Metabolic Panel:  Recent Labs Lab 01/11/16 1144 01/12/16 0542  NA 138 140  K 4.0 3.0*  CL 102 106  CO2 28 26  GLUCOSE 85 93  BUN 16 11  CREATININE 0.69 0.53  CALCIUM 9.5 8.9   GFR: Estimated Creatinine Clearance: 52.6 mL/min (by C-G formula based on SCr of 0.53 mg/dL). Liver Function Tests:  Recent Labs Lab 01/12/16 0542  AST 22  ALT 15  ALKPHOS 66  BILITOT 1.0  PROT 6.4*  ALBUMIN 3.4*   No results for input(s): LIPASE, AMYLASE in the last 168 hours. No results for input(s): AMMONIA in the last 168 hours. Coagulation Profile:  Recent Labs Lab 01/11/16 1657 01/12/16 0542  INR 2.15 2.00   Cardiac Enzymes: No results for input(s):  CKTOTAL, CKMB, CKMBINDEX, TROPONINI in the last 168 hours. BNP (last 3 results) No results for input(s): PROBNP in the last 8760 hours. HbA1C: No results for input(s): HGBA1C in the last 72 hours. CBG:  Recent Labs Lab 01/11/16 1035  GLUCAP 101*   Lipid Profile: No results for input(s): CHOL, HDL, LDLCALC, TRIG, CHOLHDL, LDLDIRECT in the last 72 hours. Thyroid Function Tests:  Recent Labs  01/11/16 1144  TSH 1.665   Anemia Panel: No results for input(s): VITAMINB12, FOLATE, FERRITIN, TIBC, IRON, RETICCTPCT in the last 72 hours. Sepsis Labs: No results for input(s): PROCALCITON, LATICACIDVEN in the last 168 hours.  Recent Results (from the past 240 hour(s))  MRSA PCR Screening     Status: None   Collection Time: 01/11/16  5:30 PM  Result Value Ref Range Status   MRSA by PCR NEGATIVE NEGATIVE Final    Comment:        The GeneXpert MRSA Assay (FDA approved for NASAL specimens only), is one component of a comprehensive MRSA colonization surveillance program. It is not intended to diagnose MRSA infection nor to guide or monitor treatment for MRSA infections.          Radiology Studies: Ct Head Wo Contrast  Result Date: 01/11/2016 CLINICAL DATA:  Altered mental status this morning. EXAM: CT HEAD WITHOUT CONTRAST TECHNIQUE: Contiguous axial images were obtained from the base of the skull through the vertex without intravenous contrast. COMPARISON:  02/24/2013 FINDINGS: Brain: Mild age related volume loss. No acute intracranial abnormality. Specifically, no hemorrhage, hydrocephalus, mass lesion, acute infarction, or significant intracranial injury. Vascular: No hyperdense vessel or unexpected calcification. Skull: No acute calvarial abnormality. Sinuses/Orbits: Visualized paranasal sinuses and mastoids clear. Orbital soft tissues unremarkable. Other:  None IMPRESSION: No acute intracranial abnormality. Electronically Signed   By: Charlett NoseKevin  Dover M.D.   On: 01/11/2016 12:08     Dg Chest Port 1 View  Result Date: 01/11/2016 CLINICAL DATA:  Altered mental status. EXAM: PORTABLE CHEST 1 VIEW COMPARISON:  12/19/2014 FINDINGS: Chronic prominence of the cardiac silhouette. No acute infiltrates or effusions. Chronic slight accentuation of the interstitial markings. No acute bone abnormality. IMPRESSION: No acute abnormality.  Chronic cardiomegaly. Electronically Signed   By: Francene BoyersJames  Maxwell M.D.   On: 01/11/2016 11:33        Scheduled Meds: . cefTRIAXone (ROCEPHIN)  IV  1 g Intravenous Q24H  . diltiazem  120 mg Oral Daily  . docusate sodium  100 mg Oral BID  . escitalopram  10 mg Oral Daily  . levothyroxine  100 mcg Oral QAC breakfast  . memantine  10 mg Oral BID WC  . pantoprazole  40 mg Oral Daily  . Warfarin - Pharmacist Dosing Inpatient   Does not apply Q24H  Continuous Infusions: . sodium chloride 100 mL/hr at 01/12/16 0330     LOS: 1 day    Time spent: 25 minutes    Torunn Chancellor [facility] If 7PM-7AM, please contact night-coverage [email or web address] Password [email or web address] 01/12/2016, 9:18 AM

## 2016-01-12 NOTE — Clinical Social Work Note (Signed)
Clinical Social Work Assessment  Patient Details  Name: Tamara [name] MRN: [id number] Date of Birth: 07/18/1924  Date of referral:  01/12/16               Reason for consult:  Discharge Planning                Permission sought to share information with:  Facility Industrial/product designerContact Representative Permission granted to share information::     Name::        Agency::     Relationship::     Contact Information:     Housing/Transportation Living arrangements for the past 2 months:  Assisted Living Facility Source of Information:  Facility Patient Interpreter Needed:  None Criminal Activity/Legal Involvement Pertinent to Current Situation/Hospitalization:  No - Comment as needed Significant Relationships:  Adult Children Lives with:  Facility Resident Do you feel safe going back to the place where you live?  Yes Need for family participation in patient care:  Yes (Comment)  Care giving concerns:  None reported. Pt is long term resident at ALF.    Social Worker assessment / plan:  CSW left voicemails for pt's sons as pt is oriented to self only. [name], pt has been a resident there for several years. She is on AL unit and family is very involved. Tamara [name] reports pt has been on a PO antibiotic for UTI, but was not improving. Pt ambulates with walker at baseline, but has been using wheelchair some recently due to weakness related to UTI. No home health prior to admission and pt is okay to return.   Employment status:  Retired Database administratornsurance information:  Managed Medicare PT Recommendations:  Not assessed at this time Information / Referral to community resources:  Other (Comment Required) (anticip[facility] )  Patient/Family's Response to care:  Will discuss when call returned by family. [facility] when medically stable.   Patient/Family's Understanding of and Emotional Response to Diagnosis, Current Treatment, and Prognosis:  Will discuss  with family when able to establish contact.   Emotional Assessment Appearance:  Appears stated age Attitude/Demeanor/Rapport:  Unable to Assess Affect (typically observed):  Unable to Assess Orientation:  Oriented to Self Alcohol / Substance use:  Not Applicable Psych involvement (Current and /or in the community):  No (Comment)  Discharge Needs  Concerns to be addressed:  Discharge Planning Concerns Readmission within the last 30 days:  No Current discharge risk:  None Barriers to Discharge:  Continued Medical Work up   Karn CassisStultz, Harrison Paulson Shanaberger, LCSW 01/12/2016, 1:54 PM [id number]

## 2016-01-12 NOTE — Clinical Social Work Note (Signed)
Pt's son, Greggory StallionGeorge returned call and indicates that things are going [facility]. Family requests return there when medically stable. Will keep ALF updated.  Derenda FennelKara Jamani Eley, LCSW [phone]

## 2016-01-12 NOTE — Progress Notes (Signed)
ANTICOAGULATION CONSULT NOTE  Pharmacy Consult for Warfarin Indication: atrial fibrillation  No Known Allergies  Patient Measurements: Height: 5\' 10"  (177.8 cm) Weight: 162 lb 8 oz (73.7 kg) IBW/kg (Calculated) : 68.5  Vital Signs: Temp: 97.9 F (36.6 C) (11/21 0607) Temp Source: Oral (11/21 0607) BP: 162/88 (11/21 0607) Pulse Rate: 71 (11/21 0607)  Labs:  Recent Labs  01/11/16 1144 01/11/16 1657 01/12/16 0542  HGB 14.3  --  13.4  HCT 43.8  --  40.9  PLT 225  --  212  LABPROT  --  24.4* 23.0*  INR  --  2.15 2.00  CREATININE 0.69  --  0.53   Estimated Creatinine Clearance: 52.6 mL/min (by C-G formula based on SCr of 0.53 mg/dL).  Medical History: Past Medical History:  Diagnosis Date  . Acute colitis 09/2011   C. difficile negative  . Atrial fibrillation (HCC)    Onset well before 2004; Negative stress nuclear study in 01/2003  . Back pain   . Chronic anticoagulation   . Dementia   . Depression   . Hiatal hernia    by CT in 2013; also noted was an adrenal adenoma, duodenal lipoma right ovarian cyst, post hysterectomy  . History of spinal fracture   . Hypercalcemia   . Hyperlipidemia   . Hypertension   . Osteoporosis   . Pericardial effusion   . Pneumonia 09/2011   09/2011  . Syncope   . Thyroid disease    hypothyroid   Medications:  Prescriptions Prior to Admission  Medication Sig Dispense Refill Last Dose  . acetaminophen (TYLENOL) 500 MG tablet Take 1,000 mg by mouth 2 (two) times daily.   01/11/2016 at Unknown time  . ciprofloxacin (CIPRO) 500 MG tablet 1 tablet 2 (two) times daily.  0 01/11/2016 at Unknown time  . diltiazem (CARDIZEM) 120 MG tablet Take 120 mg by mouth daily.   01/11/2016 at Unknown time  . docusate sodium 100 MG CAPS Take 100 mg by mouth 2 (two) times daily. 10 capsule 0 01/11/2016 at Unknown time  . ENSURE (ENSURE) Take 237 mLs by mouth daily.   01/10/2016 at Unknown time  . escitalopram (LEXAPRO) 10 MG tablet Take 1 tablet (10 mg  total) by mouth daily. 30 tablet 3 01/11/2016 at Unknown time  . levothyroxine (SYNTHROID, LEVOTHROID) 100 MCG tablet Take 100 mcg by mouth daily before breakfast.   01/11/2016 at Unknown time  . memantine (NAMENDA) 10 MG tablet Take 10 mg by mouth 2 (two) times daily. Administered at 8am and 5pm   01/11/2016 at Unknown time  . omeprazole (PRILOSEC) 40 MG capsule Take 1 capsule (40 mg total) by mouth daily. For heartburn 30 capsule 3 01/11/2016 at Unknown time  . Polyethyl Glycol-Propyl Glycol (SYSTANE OP) Place 1 drop into both eyes 2 (two) times daily. 1 drop into both eyes   01/11/2016 at Unknown time  . warfarin (COUMADIN) 4 MG tablet Take 4-6 mg by mouth See admin instructions. 4mg  on Sundays, Tuesdays, Wednesdays, Thursdays, Fridays, and Saturdays. Take 6mg  on Mondays   01/10/2016 at Unknown time  . ibuprofen (ADVIL,MOTRIN) 400 MG tablet Take 1 tablet (400 mg total) by mouth every 6 (six) hours as needed for moderate pain. 30 tablet 0 01/09/2016  . polyethylene glycol (MIRALAX / GLYCOLAX) packet Take 17 g by mouth daily as needed for mild constipation.   unknown   Assessment: Okay for Protocol.  No bleeding noted.  INR therapeutic  Goal of Therapy:  INR 2-3  Plan:  Warfarin 4mg  PO x 1 (per PTA med list) Daily PT/INR Monitor for signs and symptoms of bleeding.   Margo AyeHall, Jefte Carithers A 01/12/2016,11:47 AM

## 2016-01-13 DIAGNOSIS — N3001 Acute cystitis with hematuria: Secondary | ICD-10-CM

## 2016-01-13 LAB — BASIC METABOLIC PANEL
ANION GAP: 7 (ref 5–15)
BUN: 8 mg/dL (ref 6–20)
CHLORIDE: 108 mmol/L (ref 101–111)
CO2: 25 mmol/L (ref 22–32)
CREATININE: 0.48 mg/dL (ref 0.44–1.00)
Calcium: 8.8 mg/dL — ABNORMAL LOW (ref 8.9–10.3)
GFR calc non Af Amer: >60 mL/min (ref >60)
Glucose, Bld: 90 mg/dL (ref 65–99)
POTASSIUM: 3.5 mmol/L (ref 3.5–5.1)
SODIUM: 140 mmol/L (ref 135–145)

## 2016-01-13 LAB — PROTIME-INR
INR: 1.76
Prothrombin Time: 20.7 seconds — ABNORMAL HIGH (ref 11.4–15.2)

## 2016-01-13 MED ORDER — WARFARIN SODIUM 5 MG PO TABS: 5.0000 mg | ORAL_TABLET | Freq: Once | ORAL | Status: DC

## 2016-01-13 MED ORDER — CEFUROXIME AXETIL 250 MG PO TABS: 500.0000 mg | ORAL_TABLET | Freq: Two times a day (BID) | ORAL | Status: DC

## 2016-01-13 MED ORDER — CEFUROXIME AXETIL 500 MG PO TABS: 500.0000 mg | ORAL_TABLET | Freq: Two times a day (BID) | ORAL | 0 refills | Status: DC

## 2016-01-13 MED ORDER — METOPROLOL TARTRATE 5 MG/5ML IV SOLN
5.0000 mg | Freq: Once | INTRAVENOUS | Status: AC
Start: 2016-01-13 — End: 2016-01-13
  Administered 2016-01-13: 5 mg via INTRAVENOUS
  Filled 2016-01-13: qty 5

## 2016-01-13 NOTE — NC FL2 (Signed)
[place] MEDICAID FL2 LEVEL OF CARE SCREENING TOOL     IDENTIFICATION  Patient Name: Tamara [name] Birthdate: 12/08/1924 Sex: female Admission Date (Current Location): 01/11/2016  [place] [facility] [place]   Facility and Address:  [facility],  [address], [place] [id number]      Provider Number: [id number]  Attending Physician Name and Address:  Standley Brookinganiel P Goodrich, MD  Relative Name and Phone Number:       Current Level of Care: Hospital Recommended Level of Care: Assisted Living Facility Prior Approval Number:    Date Approved/Denied:   PASRR Number:    Discharge Plan: Other (Comment) (ALF)    Current Diagnoses: Patient Active Problem List   Diagnosis Date Noted  . UTI (urinary tract infection) 01/11/2016  . Dementia 01/11/2016  . Hypothyroidism 01/11/2016  . GERD (gastroesophageal reflux disease) 01/11/2016  . Gram-negative bacteremia 05/28/2014  . UTI (lower urinary tract infection) 05/28/2014  . Sepsis (HCC) 05/28/2014  . Hypokalemia 02/25/2013  . Hematuria 02/25/2013  . Fever 02/24/2013  . Acute bronchitis 02/24/2013  . Back pain 02/24/2013  . Left wrist fracture 02/24/2013  . Influenza due to identified novel influenza A virus with other respiratory manifestations 02/24/2013  . Degenerative joint disease 08/17/2012  . Hypercalcemia 02/29/2012  . Acute pericardial effusion 02/25/2012  . Hypertension   . Chronic anticoagulation   . Hyperlipidemia   . Atrial fibrillation (HCC) 09/28/2011  . Acute encephalopathy 09/28/2011    Orientation RESPIRATION BLADDER Height & Weight     Self  Normal Incontinent Weight: 162 lb 8 oz (73.7 kg) Height:  5\' 10"  (177.8 cm)  BEHAVIORAL SYMPTOMS/MOOD NEUROLOGICAL BOWEL NUTRITION STATUS  Other (Comment) (none)  (n/a) Continent Diet (Heart healthy)  AMBULATORY STATUS COMMUNICATION OF NEEDS Skin   Extensive Assist Verbally Normal                       Personal Care  Assistance Level of Assistance  Bathing, Feeding, Dressing Bathing Assistance: Maximum assistance Feeding assistance: Limited assistance Dressing Assistance: Maximum assistance     Functional Limitations Info  Sight, Hearing, Speech Sight Info: Adequate Hearing Info: Adequate Speech Info: Adequate    SPECIAL CARE FACTORS FREQUENCY                       Contractures Contractures Info: Not present    Additional Factors Info  Code Status, Allergies Code Status Info: Full code Allergies Info: No known allergies           Current Medications (01/13/2016):  This is the current hospital active medication list Current Facility-Administered Medications  Medication Dose Route Frequency Provider Last Rate Last Dose  . 0.9 %  sodium chloride infusion   Intravenous Continuous Erick BlinksJehanzeb Memon, MD 100 mL/hr at 01/12/16 2315    . acetaminophen (TYLENOL) tablet 650 mg  650 mg Oral Q6H PRN Erick BlinksJehanzeb Memon, MD   650 mg at 01/12/16 2302   Or  . acetaminophen (TYLENOL) suppository 650 mg  650 mg Rectal Q6H PRN Erick BlinksJehanzeb Memon, MD      . Melene Muller[START ON 01/14/2016] cefUROXime (CEFTIN) tablet 500 mg  500 mg Oral BID WC Standley Brookinganiel P Goodrich, MD      . diltiazem (DILACOR XR) 24 hr capsule 120 mg  120 mg Oral Daily Erick BlinksJehanzeb Memon, MD   120 mg at 01/13/16 0834  . docusate sodium (COLACE) capsule 100 mg  100 mg Oral BID Erick BlinksJehanzeb Memon, MD  100 mg at 01/13/16 0834  . escitalopram (LEXAPRO) tablet 10 mg  10 mg Oral Daily Erick BlinksJehanzeb Memon, MD   10 mg at 01/13/16 0834  . levothyroxine (SYNTHROID, LEVOTHROID) tablet 100 mcg  100 mcg Oral QAC breakfast Erick BlinksJehanzeb Memon, MD   100 mcg at 01/13/16 0834  . memantine (NAMENDA) tablet 10 mg  10 mg Oral BID WC Erick BlinksJehanzeb Memon, MD   10 mg at 01/13/16 0834  . ondansetron (ZOFRAN) tablet 4 mg  4 mg Oral Q6H PRN Erick BlinksJehanzeb Memon, MD       Or  . ondansetron (ZOFRAN) injection 4 mg  4 mg Intravenous Q6H PRN Erick BlinksJehanzeb Memon, MD      . pantoprazole (PROTONIX) EC tablet 40 mg  40 mg  Oral Daily Erick BlinksJehanzeb Memon, MD   40 mg at 01/13/16 0834  . polyethylene glycol (MIRALAX / GLYCOLAX) packet 17 g  17 g Oral Daily PRN Erick BlinksJehanzeb Memon, MD      . warfarin (COUMADIN) tablet 5 mg  5 mg Oral Once Standley Brookinganiel P Goodrich, MD      . Warfarin - Pharmacist Dosing Inpatient   Does not apply Q24H Erick BlinksJehanzeb Memon, MD         Discharge Medications: Medication List    STOP taking these medications   ciprofloxacin 500 MG tablet Commonly known as:  CIPRO    TAKE these medications   acetaminophen 500 MG tablet Commonly known as:  TYLENOL Take 1,000 mg by mouth 2 (two) times daily.  cefUROXime 500 MG tablet Commonly known as:  CEFTIN Take 1 tablet (500 mg total) by mouth 2 (two) times daily with a meal. Start 11/23. Start taking on:  01/14/2016  diltiazem 120 MG tablet Commonly known as:  CARDIZEM Take 120 mg by mouth daily.  DSS 100 MG Caps Take 100 mg by mouth 2 (two) times daily.  ENSURE Take 237 mLs by mouth daily.  escitalopram 10 MG tablet Commonly known as:  LEXAPRO Take 1 tablet (10 mg total) by mouth daily.  ibuprofen 400 MG tablet Commonly known as:  ADVIL,MOTRIN Take 1 tablet (400 mg total) by mouth every 6 (six) hours as needed for moderate pain.  levothyroxine 100 MCG tablet Commonly known as:  SYNTHROID, LEVOTHROID Take 100 mcg by mouth daily before breakfast.  memantine 10 MG tablet Commonly known as:  NAMENDA Take 10 mg by mouth 2 (two) times daily. Administered at 8am and 5pm  omeprazole 40 MG capsule Commonly known as:  PRILOSEC Take 1 capsule (40 mg total) by mouth daily. For heartburn  polyethylene glycol packet Commonly known as:  MIRALAX / GLYCOLAX Take 17 g by mouth daily as needed for mild constipation.  SYSTANE OP Place 1 drop into both eyes 2 (two) times daily. 1 drop into both eyes  warfarin 4 MG tablet Commonly known as:  COUMADIN Take 4-6 mg by mouth See admin instructions. 4mg  on Sundays, Tuesdays, Wednesdays, Thursdays, Fridays, and Saturdays.  Take 6mg  on Mondays       Relevant Imaging Results:  Relevant Lab Results:   Additional Information Home health PT/RN  Karn CassisStultz, Zachari Alberta Shanaberger, [place]CS[place] [phone]

## 2016-01-13 NOTE — Care Management Important Message (Signed)
Important Message  Patient Details  Name: Tamara HaggardBetty S [name] MRN: [id number] Date of Birth: 04/18/1924   Medicare Important Message Given:  Yes    Malcolm MetroChildress, Lizzett Nobile Demske, RN 01/13/2016, 12:20 PM

## 2016-01-13 NOTE — Clinical Social Work Note (Signed)
[facility]. Pt's son, Greggory StallionGeorge and facility aware and agreeable. Facility will provide transport later today.  Derenda FennelKara Loeta Herst, LCSW [phone]

## 2016-01-13 NOTE — Discharge Summary (Signed)
Physician Discharge Summary  Tamara [name] ZOX:[id number] DOB: December 23, 1924 DOA: 01/11/2016  PCP: Dwana Melena, MD  Admit date: 01/11/2016 Discharge date: 01/13/2016  Recommendations for Outpatient Follow-up:  1. Resolution of UTI   Follow-up Information    Dwana Melena, MD. Schedule an appointment as soon as possible for a visit in 1 week(s).   Specialty:  Internal Medicine Contact information: [address] [place] [address] [phone]          Discharge Diagnoses:  1. UTI 2. Acute encephalopathy 3. Hypokalemia 4. Atrial fibrillation 5. Dementia  Discharge Condition: improved Disposition: return to ALF [facility]  Diet recommendation: resume same diet as before  [employer]   01/11/16 1609  Weight: 73.7 kg (162 lb 8 oz)    History of present illness:  82 year-old woman resident of assisted living, PMH dementia, atrial fibrillation, possible UTI currently on antibiotics, presented to the emergency department with reported history of unresponsiveness. Admitted for UTI failing outpatient treatment with acute encephalopathy superimposed on dementia.  Hospital Course:  Patient was treated with empiric Rocephin with rapid improvement. Transition to oral antibiotics which will be completed in the outpatient setting. Responded appropriately to treatment. No complicating factors noted. Hospitalization was uncomplicated.  1. UTI. Appears improved. No fever. Leukocytosis resolved. Treated with Rocephin. Urine culture with multiple species. Urinalysis with too numerous to count WBC and RBC. 2. Acute encephalopathy superimposed on dementia. Appears resolved. Likely secondary to UTI, possibly adverse drug reaction to Cipro which is common in the elderly. CT head unremarkable.  3. Hypokalemia, resolved. 4. A-fib. Rate controlled. She is currently on coumadin at home, will continue. 5. Hypothyroidism. Continue Synthroid. TSH is wnl. 6. GERD. Continue  Protonix. 7. Dementia. Appears stable.   Consultants:  None  Procedures:  None  Antimicrobials:  Rocephin 11/20 >> 11/22  Ceftin 11/23 >> 11/24  Discharge Instructions  Discharge Instructions    Diet general    Complete by:  As directed    Discharge instructions    Complete by:  As directed    Call your physician or seek immediate medical attention for fever, confusion, lethargy, shortness of breath or worsening of condition.   Increase activity slowly    Complete by:  As directed        Medication List    STOP taking these medications   ciprofloxacin 500 MG tablet Commonly known as:  CIPRO     TAKE these medications   acetaminophen 500 MG tablet Commonly known as:  TYLENOL Take 1,000 mg by mouth 2 (two) times daily.   cefUROXime 500 MG tablet Commonly known as:  CEFTIN Take 1 tablet (500 mg total) by mouth 2 (two) times daily with a meal. Start 11/23. Start taking on:  01/14/2016   diltiazem 120 MG tablet Commonly known as:  CARDIZEM Take 120 mg by mouth daily.   DSS 100 MG Caps Take 100 mg by mouth 2 (two) times daily.   ENSURE Take 237 mLs by mouth daily.   escitalopram 10 MG tablet Commonly known as:  LEXAPRO Take 1 tablet (10 mg total) by mouth daily.   ibuprofen 400 MG tablet Commonly known as:  ADVIL,MOTRIN Take 1 tablet (400 mg total) by mouth every 6 (six) hours as needed for moderate pain.   levothyroxine 100 MCG tablet Commonly known as:  SYNTHROID, LEVOTHROID Take 100 mcg by mouth daily before breakfast.   memantine 10 MG tablet Commonly known as:  NAMENDA Take 10 mg by mouth 2 (two) times  daily. Administered at 8am and 5pm   omeprazole 40 MG capsule Commonly known as:  PRILOSEC Take 1 capsule (40 mg total) by mouth daily. For heartburn   polyethylene glycol packet Commonly known as:  MIRALAX / GLYCOLAX Take 17 g by mouth daily as needed for mild constipation.   SYSTANE OP Place 1 drop into both eyes 2 (two) times daily.  1 drop into both eyes   warfarin 4 MG tablet Commonly known as:  COUMADIN Take 4-6 mg by mouth See admin instructions. 4mg  on Sundays, Tuesdays, Wednesdays, Thursdays, Fridays, and Saturdays. Take 6mg  on Mondays      No Known Allergies  The results of significant diagnostics from this hospitalization (including imaging, microbiology, ancillary and laboratory) are listed below for reference.    Significant Diagnostic Studies: Ct Head Wo Contrast  Result Date: 01/11/2016 CLINICAL DATA:  Altered mental status this morning. EXAM: CT HEAD WITHOUT CONTRAST TECHNIQUE: Contiguous axial images were obtained from the base of the skull through the vertex without intravenous contrast. COMPARISON:  02/24/2013 FINDINGS: Brain: Mild age related volume loss. No acute intracranial abnormality. Specifically, no hemorrhage, hydrocephalus, mass lesion, acute infarction, or significant intracranial injury. Vascular: No hyperdense vessel or unexpected calcification. Skull: No acute calvarial abnormality. Sinuses/Orbits: Visualized paranasal sinuses and mastoids clear. Orbital soft tissues unremarkable. Other:  None IMPRESSION: No acute intracranial abnormality. Electronically Signed   By: Charlett NoseKevin  Dover M.D.   On: 01/11/2016 12:08   Dg Chest Port 1 View  Result Date: 01/11/2016 CLINICAL DATA:  Altered mental status. EXAM: PORTABLE CHEST 1 VIEW COMPARISON:  12/19/2014 FINDINGS: Chronic prominence of the cardiac silhouette. No acute infiltrates or effusions. Chronic slight accentuation of the interstitial markings. No acute bone abnormality. IMPRESSION: No acute abnormality.  Chronic cardiomegaly. Electronically Signed   By: Francene BoyersJames  Maxwell M.D.   On: 01/11/2016 11:33    Microbiology: Recent Results (from the past 240 hour(s))  Urine culture     Status: Abnormal   Collection Time: 01/11/16 12:18 PM  Result Value Ref Range Status   Specimen Description URINE, CLEAN CATCH  Final   Special Requests NONE  Final    Culture MULTIPLE SPECIES PRESENT, SUGGEST RECOLLECTION (A)  Final   Report Status 01/12/2016 FINAL  Final  MRSA PCR Screening     Status: None   Collection Time: 01/11/16  5:30 PM  Result Value Ref Range Status   MRSA by PCR NEGATIVE NEGATIVE Final    Comment:        The GeneXpert MRSA Assay (FDA approved for NASAL specimens only), is one component of a comprehensive MRSA colonization surveillance program. It is not intended to diagnose MRSA infection nor to guide or monitor treatment for MRSA infections.      Labs: Basic Metabolic Panel:  Recent Labs Lab 01/11/16 1144 01/12/16 0542 01/13/16 0548  NA 138 140 140  K 4.0 3.0* 3.5  CL 102 106 108  CO2 28 26 25   GLUCOSE 85 93 90  BUN 16 11 8   CREATININE 0.69 0.53 0.48  CALCIUM 9.5 8.9 8.8*   Liver Function Tests:  Recent Labs Lab 01/12/16 0542  AST 22  ALT 15  ALKPHOS 66  BILITOT 1.0  PROT 6.4*  ALBUMIN 3.4*   CBC:  Recent Labs Lab 01/11/16 1144 01/12/16 0542  WBC 10.9* 7.9  NEUTROABS 8.6*  --   HGB 14.3 13.4  HCT 43.8 40.9  MCV 98.2 97.4  PLT 225 212   CBG:  Recent Labs Lab 01/11/16 1035  GLUCAP 101*    Principal Problem:   UTI (urinary tract infection) Active Problems:   Atrial fibrillation (HCC)   Acute encephalopathy   Hypertension   Dementia   Hypothyroidism   Time coordinating discharge: 35 minutes  Signed:  Brendia Sacksaniel Laden Fieldhouse, MD [facility] 01/13/2016, 12:04 PM

## 2016-01-13 NOTE — Progress Notes (Addendum)
Physician Discharge Summary  Tamara [name]:[id number] DOB: December 23, 1924 DOA: 01/11/2016  PCP: Dwana Melena, MD  Admit date: 01/11/2016 Discharge date: 01/13/2016  Recommendations for Outpatient Follow-up:  1. Resolution of UTI   Follow-up Information    Dwana Melena, MD. Schedule an appointment as soon as possible for a visit in 1 week(s).   Specialty:  Internal Medicine Contact information: [address] [place] [address] [phone]          Discharge Diagnoses:  1. UTI 2. Acute encephalopathy 3. Hypokalemia 4. Atrial fibrillation 5. Dementia  Discharge Condition: improved Disposition: return to ALF [facility]  Diet recommendation: resume same diet as before  [employer]   01/11/16 1609  Weight: 73.7 kg (162 lb 8 oz)    History of present illness:  82 year-old woman resident of assisted living, PMH dementia, atrial fibrillation, possible UTI currently on antibiotics, presented to the emergency department with reported history of unresponsiveness. Admitted for UTI failing outpatient treatment with acute encephalopathy superimposed on dementia.  Hospital Course:  Patient was treated with empiric Rocephin with rapid improvement. Transition to oral antibiotics which will be completed in the outpatient setting. Responded appropriately to treatment. No complicating factors noted. Hospitalization was uncomplicated.  1. UTI. Appears improved. No fever. Leukocytosis resolved. Treated with Rocephin. Urine culture with multiple species. Urinalysis with too numerous to count WBC and RBC. 2. Acute encephalopathy superimposed on dementia. Appears resolved. Likely secondary to UTI, possibly adverse drug reaction to Cipro which is common in the elderly. CT head unremarkable.  3. Hypokalemia, resolved. 4. A-fib. Rate controlled. She is currently on coumadin at home, will continue. 5. Hypothyroidism. Continue Synthroid. TSH is wnl. 6. GERD. Continue  Protonix. 7. Dementia. Appears stable.   Consultants:  None  Procedures:  None  Antimicrobials:  Rocephin 11/20 >> 11/22  Ceftin 11/23 >> 11/24  Discharge Instructions  Discharge Instructions    Diet general    Complete by:  As directed    Discharge instructions    Complete by:  As directed    Call your physician or seek immediate medical attention for fever, confusion, lethargy, shortness of breath or worsening of condition.   Increase activity slowly    Complete by:  As directed        Medication List    STOP taking these medications   ciprofloxacin 500 MG tablet Commonly known as:  CIPRO     TAKE these medications   acetaminophen 500 MG tablet Commonly known as:  TYLENOL Take 1,000 mg by mouth 2 (two) times daily.   cefUROXime 500 MG tablet Commonly known as:  CEFTIN Take 1 tablet (500 mg total) by mouth 2 (two) times daily with a meal. Start 11/23. Start taking on:  01/14/2016   diltiazem 120 MG tablet Commonly known as:  CARDIZEM Take 120 mg by mouth daily.   DSS 100 MG Caps Take 100 mg by mouth 2 (two) times daily.   ENSURE Take 237 mLs by mouth daily.   escitalopram 10 MG tablet Commonly known as:  LEXAPRO Take 1 tablet (10 mg total) by mouth daily.   ibuprofen 400 MG tablet Commonly known as:  ADVIL,MOTRIN Take 1 tablet (400 mg total) by mouth every 6 (six) hours as needed for moderate pain.   levothyroxine 100 MCG tablet Commonly known as:  SYNTHROID, LEVOTHROID Take 100 mcg by mouth daily before breakfast.   memantine 10 MG tablet Commonly known as:  NAMENDA Take 10 mg by mouth 2 (two) times  daily. Administered at 8am and 5pm   omeprazole 40 MG capsule Commonly known as:  PRILOSEC Take 1 capsule (40 mg total) by mouth daily. For heartburn   polyethylene glycol packet Commonly known as:  MIRALAX / GLYCOLAX Take 17 g by mouth daily as needed for mild constipation.   SYSTANE OP Place 1 drop into both eyes 2 (two) times daily.  1 drop into both eyes   warfarin 4 MG tablet Commonly known as:  COUMADIN Take 4-6 mg by mouth See admin instructions. 4mg  on Sundays, Tuesdays, Wednesdays, Thursdays, Fridays, and Saturdays. Take 6mg  on Mondays      No Known Allergies  The results of significant diagnostics from this hospitalization (including imaging, microbiology, ancillary and laboratory) are listed below for reference.    Significant Diagnostic Studies: Ct Head Wo Contrast  Result Date: 01/11/2016 CLINICAL DATA:  Altered mental status this morning. EXAM: CT HEAD WITHOUT CONTRAST TECHNIQUE: Contiguous axial images were obtained from the base of the skull through the vertex without intravenous contrast. COMPARISON:  02/24/2013 FINDINGS: Brain: Mild age related volume loss. No acute intracranial abnormality. Specifically, no hemorrhage, hydrocephalus, mass lesion, acute infarction, or significant intracranial injury. Vascular: No hyperdense vessel or unexpected calcification. Skull: No acute calvarial abnormality. Sinuses/Orbits: Visualized paranasal sinuses and mastoids clear. Orbital soft tissues unremarkable. Other:  None IMPRESSION: No acute intracranial abnormality. Electronically Signed   By: Charlett NoseKevin  Dover M.D.   On: 01/11/2016 12:08   Dg Chest Port 1 View  Result Date: 01/11/2016 CLINICAL DATA:  Altered mental status. EXAM: PORTABLE CHEST 1 VIEW COMPARISON:  12/19/2014 FINDINGS: Chronic prominence of the cardiac silhouette. No acute infiltrates or effusions. Chronic slight accentuation of the interstitial markings. No acute bone abnormality. IMPRESSION: No acute abnormality.  Chronic cardiomegaly. Electronically Signed   By: Francene BoyersJames  Maxwell M.D.   On: 01/11/2016 11:33    Microbiology: Recent Results (from the past 240 hour(s))  Urine culture     Status: Abnormal   Collection Time: 01/11/16 12:18 PM  Result Value Ref Range Status   Specimen Description URINE, CLEAN CATCH  Final   Special Requests NONE  Final    Culture MULTIPLE SPECIES PRESENT, SUGGEST RECOLLECTION (A)  Final   Report Status 01/12/2016 FINAL  Final  MRSA PCR Screening     Status: None   Collection Time: 01/11/16  5:30 PM  Result Value Ref Range Status   MRSA by PCR NEGATIVE NEGATIVE Final    Comment:        The GeneXpert MRSA Assay (FDA approved for NASAL specimens only), is one component of a comprehensive MRSA colonization surveillance program. It is not intended to diagnose MRSA infection nor to guide or monitor treatment for MRSA infections.      Labs: Basic Metabolic Panel:  Recent Labs Lab 01/11/16 1144 01/12/16 0542 01/13/16 0548  NA 138 140 140  K 4.0 3.0* 3.5  CL 102 106 108  CO2 28 26 25   GLUCOSE 85 93 90  BUN 16 11 8   CREATININE 0.69 0.53 0.48  CALCIUM 9.5 8.9 8.8*   Liver Function Tests:  Recent Labs Lab 01/12/16 0542  AST 22  ALT 15  ALKPHOS 66  BILITOT 1.0  PROT 6.4*  ALBUMIN 3.4*   CBC:  Recent Labs Lab 01/11/16 1144 01/12/16 0542  WBC 10.9* 7.9  NEUTROABS 8.6*  --   HGB 14.3 13.4  HCT 43.8 40.9  MCV 98.2 97.4  PLT 225 212   CBG:  Recent Labs Lab 01/11/16 1035  GLUCAP 101*    Principal Problem:   UTI (urinary tract infection) Active Problems:   Atrial fibrillation (HCC)   Acute encephalopathy   Hypertension   Dementia   Hypothyroidism   Time coordinating discharge: 35 minutes  Signed:  Brendia Sacks, MD [facility] 01/13/2016, 12:04 PM

## 2016-01-13 NOTE — Progress Notes (Signed)
ANTICOAGULATION CONSULT NOTE  Pharmacy Consult for Warfarin Indication: atrial fibrillation  No Known Allergies  Patient Measurements: Height: 5\' 10"  (177.8 cm) Weight: 162 lb 8 oz (73.7 kg) IBW/kg (Calculated) : 68.5  Vital Signs: Temp: 98.2 F (36.8 C) (11/22 0626) Temp Source: Oral (11/22 0626) BP: 161/103 (11/22 0626) Pulse Rate: 98 (11/22 0626)  Labs:  Recent Labs  01/11/16 1144 01/11/16 1657 01/12/16 0542 01/13/16 0548  HGB 14.3  --  13.4  --   HCT 43.8  --  40.9  --   PLT 225  --  212  --   LABPROT  --  24.4* 23.0* 20.7*  INR  --  2.15 2.00 1.76  CREATININE 0.69  --  0.53 0.48   Estimated Creatinine Clearance: 52.6 mL/min (by C-G formula based on SCr of 0.48 mg/dL).  Medical History: Past Medical History:  Diagnosis Date  . Acute colitis 09/2011   C. difficile negative  . Atrial fibrillation (HCC)    Onset well before 2004; Negative stress nuclear study in 01/2003  . Back pain   . Chronic anticoagulation   . Dementia   . Depression   . Hiatal hernia    by CT in 2013; also noted was an adrenal adenoma, duodenal lipoma right ovarian cyst, post hysterectomy  . History of spinal fracture   . Hypercalcemia   . Hyperlipidemia   . Hypertension   . Osteoporosis   . Pericardial effusion   . Pneumonia 09/2011   09/2011  . Syncope   . Thyroid disease    hypothyroid   Medications:  Prescriptions Prior to Admission  Medication Sig Dispense Refill Last Dose  . acetaminophen (TYLENOL) 500 MG tablet Take 1,000 mg by mouth 2 (two) times daily.   01/11/2016 at Unknown time  . ciprofloxacin (CIPRO) 500 MG tablet 1 tablet 2 (two) times daily.  0 01/11/2016 at Unknown time  . diltiazem (CARDIZEM) 120 MG tablet Take 120 mg by mouth daily.   01/11/2016 at Unknown time  . docusate sodium 100 MG CAPS Take 100 mg by mouth 2 (two) times daily. 10 capsule 0 01/11/2016 at Unknown time  . ENSURE (ENSURE) Take 237 mLs by mouth daily.   01/10/2016 at Unknown time  .  escitalopram (LEXAPRO) 10 MG tablet Take 1 tablet (10 mg total) by mouth daily. 30 tablet 3 01/11/2016 at Unknown time  . levothyroxine (SYNTHROID, LEVOTHROID) 100 MCG tablet Take 100 mcg by mouth daily before breakfast.   01/11/2016 at Unknown time  . memantine (NAMENDA) 10 MG tablet Take 10 mg by mouth 2 (two) times daily. Administered at 8am and 5pm   01/11/2016 at Unknown time  . omeprazole (PRILOSEC) 40 MG capsule Take 1 capsule (40 mg total) by mouth daily. For heartburn 30 capsule 3 01/11/2016 at Unknown time  . Polyethyl Glycol-Propyl Glycol (SYSTANE OP) Place 1 drop into both eyes 2 (two) times daily. 1 drop into both eyes   01/11/2016 at Unknown time  . warfarin (COUMADIN) 4 MG tablet Take 4-6 mg by mouth See admin instructions. 4mg  on Sundays, Tuesdays, Wednesdays, Thursdays, Fridays, and Saturdays. Take 6mg  on Mondays   01/10/2016 at Unknown time  . ibuprofen (ADVIL,MOTRIN) 400 MG tablet Take 1 tablet (400 mg total) by mouth every 6 (six) hours as needed for moderate pain. 30 tablet 0 01/09/2016  . polyethylene glycol (MIRALAX / GLYCOLAX) packet Take 17 g by mouth daily as needed for mild constipation.   unknown   Assessment: No bleeding noted.  INR  trended down, now SUBtherapeutic  Goal of Therapy:  INR 2-3   Plan:  Warfarin 5mg  PO x 1 to boost INR  Daily PT/INR Monitor for signs and symptoms of bleeding.   Margo AyeHall, Demarkis Gheen A 01/13/2016,8:31 AM

## 2016-01-13 NOTE — Progress Notes (Signed)
Discharged PT per MD order and protocol. Discharge handouts reviewed/explained. Education completed.  Pt verbalized understanding and left with all belongings. VSS. IV catheter D/C.  Prescriptions explained. Patient discharged via [facility].

## 2016-01-13 NOTE — Discharge Instructions (Signed)
Urinary Tract Infection, Adult Introduction A urinary tract infection (UTI) is an infection of any part of the urinary tract. The urinary tract includes the:  Kidneys.  Ureters.  Bladder.  Urethra. These organs make, store, and get rid of pee (urine) in the body. Follow these instructions at home:  Take over-the-counter and prescription medicines only as told by your doctor.  If you were prescribed an antibiotic medicine, take it as told by your doctor. Do not stop taking the antibiotic even if you start to feel better.  Avoid the following drinks:  Alcohol.  Caffeine.  Tea.  Carbonated drinks.  Drink enough fluid to keep your pee clear or pale yellow.  Keep all follow-up visits as told by your doctor. This is important.  Make sure to:  Empty your bladder often and completely. Do not to hold pee for long periods of time.  Empty your bladder before and after sex.  Wipe from front to back after a bowel movement if you are female. Use each tissue one time when you wipe. Contact a doctor if:  You have back pain.  You have a fever.  You feel sick to your stomach (nauseous).  You throw up (vomit).  Your symptoms do not get better after 3 days.  Your symptoms go away and then come back. Get help right away if:  You have very bad back pain.  You have very bad lower belly (abdominal) pain.  You are throwing up and cannot keep down any medicines or water. This information is not intended to replace advice given to you by your health care provider. Make sure you discuss any questions you have with your health care provider. Document Released: 07/27/2007 Document Revised: 07/16/2015 Document Reviewed: 12/29/2014  2017 Elsevier  

## 2016-01-13 NOTE — Care Management Note (Signed)
Case Management Note  Patient Details  Name: Tamara [name] MRN: [id number] Date of Birth: 12/09/1924   Expected Discharge Date:     01/13/2016             Expected Discharge Plan:  Assisted Living / Rest Home  In-House Referral:  Clinical Social Work  Discharge planning Services  CM Consult  Post Acute Care Choice:    NA Choice offered to:    NA  Status of Service:  Completed, signed off  Additional Comments: Pt discharging back to ALF today. CSW has made arrangements. No further CM needs.   Malcolm Metrohildress, Lonnette Shrode Demske, RN 01/13/2016, 12:21 PM

## 2016-01-13 NOTE — Progress Notes (Signed)
PROGRESS NOTE  Tamara [name] GLO:[id number]:[id number] DOB: 11/19/1924 DOA: 01/11/2016 PCP: Dwana MelenaZack Hall, MD  Brief Narrative: 82 year-old woman resident of assisted living, PMH dementia, atrial fibrillation, possible UTI currently on antibiotics, presented to the emergency department with reported history of unresponsiveness. Admitted for UTI failing outpatient treatment with acute encephalopathy superimposed on dementia.  Assessment/Plan: 1. UTI. Appears improved. No fever. Leukocytosis resolved. Treated with Rocephin. Urine culture with multiple species. Urinalysis with too numerous to count WBC and RBC. 2. Acute encephalopathy superimposed on dementia. Appears resolved. Likely secondary to UTI, possibly adverse drug reaction to Cipro which is common in the elderly. CT head unremarkable.  3. Hypokalemia, resolved. 4. A-fib. Rate controlled. She is currently on coumadin at home, will continue. 5. Hypothyroidism. Continue Synthroid. TSH is wnl. 6. GERD. Continue Protonix. 7. Dementia. Appears stable.    Appears to be close to baseline. Transition to oral antibiotics return to assisted living today.   Discussed with healthcare power of attorney Tamara [name] by telephone today, updated on care, all questions answered.   Tamara Sacksaniel Avraham Benish, MD  [facility] Direct contact: [phone] --Via amion app OR  --[email or web address]; password [email or web address]  7PM-7AM contact night coverage as above 01/13/2016, 7:04 AM  LOS: 2 days   Consultants:  None  Procedures:  None  Antimicrobials:  Rocephin 11/20 >> 11/22  Ceftin 11/23 >> 11/24  CC: Follow-up acute encephalopathy  Interval history/Subjective: She is feeling the same today as she was yesterday. Denies any pain. She ate some cereal and drank some juice for breakfast. No complaints.  ROS:  No nausea or vomiting.  Objective: Vitals:   01/12/16 2200 01/12/16 2205 01/12/16 2222 01/13/16 0626  BP: (!) 147/118 (!) 196/103 (!) 170/90 (!) 161/103    Pulse: 95   98  Resp: 18   20  Temp: 98.4 F (36.9 C)   98.2 F (36.8 C)  TempSrc: Axillary   Oral  SpO2: 100%   98%  Weight:      Height:        Intake/Output Summary (Last 24 hours) at 01/13/16 0704 Last data filed at 01/12/16 2315  Gross per 24 hour  Intake             1750 ml  Output                0 ml  Net             1750 ml     Filed Weights   01/11/16 1609  Weight: 73.7 kg (162 lb 8 oz)    Exam:    Constitutional:  . Appears calm and comfortable Eyes:  . PERRL and irises appear normal ENMT:  . Slightly hard of hearing hearing  Respiratory:  . CTA bilaterally, no w/r/r.  . Respiratory effort normal. No retractions or accessory muscle use Cardiovascular:  . RRR, no m/r/g . No LE extremity edema   Abdomen:  . soft, ntnd Musculoskeletal:  o Moves all extremities well. Lifts both legs off the bed. Skin:  . Unremarkable Psychiatric:  . Mental status o Mood, affect appropriate o Orientation to person; not place or time  I have personally reviewed following labs and imaging studies:  BMP and CBC are unremarkable  Scheduled Meds: . cefTRIAXone (ROCEPHIN)  IV  1 g Intravenous Q24H  . diltiazem  120 mg Oral Daily  . docusate sodium  100 mg Oral BID  . escitalopram  10 mg Oral Daily  . levothyroxine  100 mcg Oral QAC breakfast  .  memantine  10 mg Oral BID WC  . metoprolol  5 mg Intravenous Once  . pantoprazole  40 mg Oral Daily  . Warfarin - Pharmacist Dosing Inpatient   Does not apply Q24H   Continuous Infusions: . sodium chloride 100 mL/hr at 01/12/16 2315    Active Problems:   Atrial fibrillation (HCC)   Acute encephalopathy   Hypertension   Hyperlipidemia   UTI (urinary tract infection)   Dementia   Hypothyroidism   GERD (gastroesophageal reflux disease)   LOS: 2 days   Time spent 25 minutes  By signing my name below, I, [name], attest that this documentation has been prepared under the direction and in the presence of  [name], MD. Electronically signed: [name], Scribe.  01/13/16, 11:18 AM

## 2016-01-21 ENCOUNTER — Emergency Department (HOSPITAL_COMMUNITY): Payer: Medicare Other

## 2016-01-21 ENCOUNTER — Emergency Department (HOSPITAL_COMMUNITY)
Admission: EM | Admit: 2016-01-21 | Discharge: 2016-01-21 | Disposition: A | Discharge status: Home / Self Care | Payer: Medicare Other | Attending: Emergency Medicine | Admitting: Emergency Medicine

## 2016-01-21 ENCOUNTER — Encounter (HOSPITAL_COMMUNITY): Payer: Self-pay | Admitting: Emergency Medicine

## 2016-01-21 DIAGNOSIS — Z79899 Other long term (current) drug therapy: Secondary | ICD-10-CM | POA: Insufficient documentation

## 2016-01-21 DIAGNOSIS — I10 Essential (primary) hypertension: Secondary | ICD-10-CM | POA: Diagnosis not present

## 2016-01-21 DIAGNOSIS — X58XXXA Exposure to other specified factors, initial encounter: Secondary | ICD-10-CM | POA: Insufficient documentation

## 2016-01-21 DIAGNOSIS — Y939 Activity, unspecified: Secondary | ICD-10-CM | POA: Insufficient documentation

## 2016-01-21 DIAGNOSIS — Z791 Long term (current) use of non-steroidal anti-inflammatories (NSAID): Secondary | ICD-10-CM | POA: Insufficient documentation

## 2016-01-21 DIAGNOSIS — Z7901 Long term (current) use of anticoagulants: Secondary | ICD-10-CM | POA: Diagnosis not present

## 2016-01-21 DIAGNOSIS — Y999 Unspecified external cause status: Secondary | ICD-10-CM | POA: Diagnosis not present

## 2016-01-21 DIAGNOSIS — M8448XA Pathological fracture, other site, initial encounter for fracture: Secondary | ICD-10-CM | POA: Diagnosis not present

## 2016-01-21 DIAGNOSIS — R112 Nausea with vomiting, unspecified: Secondary | ICD-10-CM | POA: Diagnosis not present

## 2016-01-21 DIAGNOSIS — E039 Hypothyroidism, unspecified: Secondary | ICD-10-CM | POA: Diagnosis not present

## 2016-01-21 DIAGNOSIS — S22078A Other fracture of T9-T10 vertebra, initial encounter for closed fracture: Secondary | ICD-10-CM

## 2016-01-21 DIAGNOSIS — Y929 Unspecified place or not applicable: Secondary | ICD-10-CM | POA: Diagnosis not present

## 2016-01-21 DIAGNOSIS — R1012 Left upper quadrant pain: Secondary | ICD-10-CM | POA: Diagnosis present

## 2016-01-21 LAB — URINALYSIS, ROUTINE W REFLEX MICROSCOPIC
BILIRUBIN URINE: NEGATIVE
Glucose, UA: NEGATIVE mg/dL
KETONES UR: NEGATIVE mg/dL
Leukocytes, UA: NEGATIVE
NITRITE: NEGATIVE
PH: 7 (ref 5.0–8.0)
Protein, ur: NEGATIVE mg/dL
Specific Gravity, Urine: <1.005 — ABNORMAL LOW (ref 1.005–1.030)

## 2016-01-21 LAB — URINE MICROSCOPIC-ADD ON

## 2016-01-21 LAB — COMPREHENSIVE METABOLIC PANEL
ALT: 15 U/L (ref 14–54)
AST: 20 U/L (ref 15–41)
Albumin: 3.3 g/dL — ABNORMAL LOW (ref 3.5–5.0)
Alkaline Phosphatase: 73 U/L (ref 38–126)
Anion gap: 9 (ref 5–15)
BUN: 13 mg/dL (ref 6–20)
CHLORIDE: 104 mmol/L (ref 101–111)
CO2: 29 mmol/L (ref 22–32)
Calcium: 9.2 mg/dL (ref 8.9–10.3)
Creatinine, Ser: 0.5 mg/dL (ref 0.44–1.00)
Glucose, Bld: 99 mg/dL (ref 65–99)
POTASSIUM: 3.6 mmol/L (ref 3.5–5.1)
Sodium: 142 mmol/L (ref 135–145)
Total Bilirubin: 0.5 mg/dL (ref 0.3–1.2)
Total Protein: 6.4 g/dL — ABNORMAL LOW (ref 6.5–8.1)

## 2016-01-21 LAB — CBC WITH DIFFERENTIAL/PLATELET
Basophils Absolute: 0 10*3/uL (ref 0.0–0.1)
Basophils Relative: 0 %
Eosinophils Absolute: 0.1 10*3/uL (ref 0.0–0.7)
Eosinophils Relative: 1 %
HEMATOCRIT: 40.9 % (ref 36.0–46.0)
HEMOGLOBIN: 13.3 g/dL (ref 12.0–15.0)
LYMPHS PCT: 9 %
Lymphs Abs: 1.1 10*3/uL (ref 0.7–4.0)
MCH: 31.8 pg (ref 26.0–34.0)
MCHC: 32.5 g/dL (ref 30.0–36.0)
MCV: 97.8 fL (ref 78.0–100.0)
Monocytes Absolute: 1 10*3/uL (ref 0.1–1.0)
Monocytes Relative: 8 %
NEUTROS ABS: 10.1 10*3/uL — AB (ref 1.7–7.7)
Neutrophils Relative %: 82 %
Platelets: 273 10*3/uL (ref 150–400)
RBC: 4.18 MIL/uL (ref 3.87–5.11)
RDW: 13.7 % (ref 11.5–15.5)
WBC: 12.4 10*3/uL — AB (ref 4.0–10.5)

## 2016-01-21 LAB — PROTIME-INR
INR: 2.32
PROTHROMBIN TIME: 25.9 s — AB (ref 11.4–15.2)

## 2016-01-21 LAB — TROPONIN I: Troponin I: <0.03 ng/mL (ref <0.03)

## 2016-01-21 LAB — LIPASE, BLOOD: LIPASE: 24 U/L (ref 11–51)

## 2016-01-21 MED ORDER — ONDANSETRON 4 MG PO TBDP: 4.0000 mg | ORAL_TABLET | Freq: Three times a day (TID) | ORAL | 1 refills | Status: DC | PRN

## 2016-01-21 MED ORDER — IOPAMIDOL (ISOVUE-300) INJECTION 61%
100.0000 mL | Freq: Once | INTRAVENOUS | Status: AC | PRN
  Administered 2016-01-21: 100 mL via INTRAVENOUS

## 2016-01-21 MED ORDER — SODIUM CHLORIDE 0.9 % IV BOLUS (SEPSIS)
250.0000 mL | Freq: Once | INTRAVENOUS | Status: AC
  Administered 2016-01-21: 250 mL via INTRAVENOUS

## 2016-01-21 MED ORDER — SODIUM CHLORIDE 0.9 % IV SOLN
INTRAVENOUS | Status: DC
  Administered 2016-01-21: 10:00:00 via INTRAVENOUS

## 2016-01-21 MED ORDER — TRAMADOL HCL 50 MG PO TABS: 50.0000 mg | ORAL_TABLET | Freq: Four times a day (QID) | ORAL | 0 refills | Status: DC | PRN

## 2016-01-21 MED ORDER — IOPAMIDOL (ISOVUE-300) INJECTION 61%
INTRAVENOUS | Status: AC
  Filled 2016-01-21: qty 30

## 2016-01-21 MED ORDER — ONDANSETRON HCL 4 MG/2ML IJ SOLN
4.0000 mg | Freq: Once | INTRAMUSCULAR | Status: AC
  Administered 2016-01-21: 4 mg via INTRAVENOUS
  Filled 2016-01-21: qty 2

## 2016-01-21 MED ORDER — IOPAMIDOL (ISOVUE-370) INJECTION 76%
INTRAVENOUS | Status: AC
  Filled 2016-01-21: qty 300

## 2016-01-21 MED ORDER — HYDROMORPHONE HCL 1 MG/ML IJ SOLN
0.5000 mg | Freq: Once | INTRAMUSCULAR | Status: AC
  Administered 2016-01-21: 0.5 mg via INTRAVENOUS
  Filled 2016-01-21: qty 1

## 2016-01-21 NOTE — ED Provider Notes (Signed)
[facility]-EMERGENCY DEPT Provider Note   CSN: [id number] Arrival date & time: 01/21/16  0503  Time seen 05:50    History   Chief Complaint Chief Complaint  Patient presents with  . Abdominal Pain   Level V caveat for dementia  HPI Tamara [name] is a 82 y.o. female.  HPI  when I go in to see the patient she states she feels fine. She denies having any pain now. However some point she does tell me she did have pain earlier and points to her left upper quadrant. She states she was having vomiting.  PCP Dr. Margo Aye  Past Medical History:  Diagnosis Date  . Acute colitis 09/2011   C. difficile negative  . Atrial fibrillation (HCC)    Onset well before 2004; Negative stress nuclear study in 01/2003  . Back pain   . Chronic anticoagulation   . Dementia   . Depression   . Hiatal hernia    by CT in 2013; also noted was an adrenal adenoma, duodenal lipoma right ovarian cyst, post hysterectomy  . History of spinal fracture   . Hypercalcemia   . Hyperlipidemia   . Hypertension   . Osteoporosis   . Pericardial effusion   . Pneumonia 09/2011   09/2011  . Syncope   . Thyroid disease    hypothyroid    Patient Active Problem List   Diagnosis Date Noted  . UTI (urinary tract infection) 01/11/2016  . Dementia 01/11/2016  . Hypothyroidism 01/11/2016  . GERD (gastroesophageal reflux disease) 01/11/2016  . Gram-negative bacteremia 05/28/2014  . UTI (lower urinary tract infection) 05/28/2014  . Sepsis (HCC) 05/28/2014  . Hypokalemia 02/25/2013  . Hematuria 02/25/2013  . Fever 02/24/2013  . Acute bronchitis 02/24/2013  . Back pain 02/24/2013  . Left wrist fracture 02/24/2013  . Influenza due to identified novel influenza A virus with other respiratory manifestations 02/24/2013  . Degenerative joint disease 08/17/2012  . Hypercalcemia 02/29/2012  . Acute pericardial effusion 02/25/2012  . Hypertension   . Chronic anticoagulation   . Hyperlipidemia   . Atrial fibrillation (HCC)  09/28/2011  . Acute encephalopathy 09/28/2011    Past Surgical History:  Procedure Laterality Date  . ABDOMINAL HYSTERECTOMY     No oophorectomy  . APPENDECTOMY    . COLONOSCOPY  2005   Reportedly normal screening study    OB History    Gravida Para Term Preterm AB Living   2 2 2          SAB TAB Ectopic Multiple Live Births                   Home Medications    Prior to Admission medications   Medication Sig Start Date End Date Taking? Authorizing Provider  acetaminophen (TYLENOL) 500 MG tablet Take 1,000 mg by mouth 2 (two) times daily.    Historical Provider, MD  cefUROXime (CEFTIN) 500 MG tablet Take 1 tablet (500 mg total) by mouth 2 (two) times daily with a meal. Start 11/23. 01/14/16   Standley Brooking, MD  diltiazem (CARDIZEM) 120 MG tablet Take 120 mg by mouth daily. 02/06/13   Historical Provider, MD  docusate sodium 100 MG CAPS Take 100 mg by mouth 2 (two) times daily. 02/26/13   Tora Kindred York, PA-C  ENSURE (ENSURE) Take 237 mLs by mouth daily.    Historical Provider, MD  escitalopram (LEXAPRO) 10 MG tablet Take 1 tablet (10 mg total) by mouth daily. 05/14/12   Sharon Seller, NP  ibuprofen (ADVIL,MOTRIN) 400 MG tablet Take 1 tablet (400 mg total) by mouth every 6 (six) hours as needed for moderate pain. 07/21/15   Marily MemosJason Mesner, MD  levothyroxine (SYNTHROID, LEVOTHROID) 100 MCG tablet Take 100 mcg by mouth daily before breakfast.    Historical Provider, MD  memantine (NAMENDA) 10 MG tablet Take 10 mg by mouth 2 (two) times daily. Administered at 8am and 5pm    Historical Provider, MD  omeprazole (PRILOSEC) 40 MG capsule Take 1 capsule (40 mg total) by mouth daily. For heartburn 05/14/12   Sharon SellerJessica K Eubanks, NP  Polyethyl Glycol-Propyl Glycol (SYSTANE OP) Place 1 drop into both eyes 2 (two) times daily. 1 drop into both eyes    Historical Provider, MD  polyethylene glycol (MIRALAX / GLYCOLAX) packet Take 17 g by mouth daily as needed for mild constipation.    Historical  Provider, MD  warfarin (COUMADIN) 4 MG tablet Take 4-6 mg by mouth See admin instructions. 4mg  on Sundays, Tuesdays, Wednesdays, Thursdays, Fridays, and Saturdays. Take 6mg  on Mondays    Historical Provider, MD    Family History Family History  Problem Relation Age of Onset  . Cancer Mother   . Heart attack Father   . Hypertension Father     Social History Social History  Substance Use Topics  . Smoking status: Never Smoker  . Smokeless tobacco: Never Used  . Alcohol use No  lives in a facility   Allergies   Patient has no known allergies.   Review of Systems Review of Systems  All other systems reviewed and are negative.    Physical Exam Updated Vital Signs BP 155/96 (BP Location: Left Arm)   Pulse 100   Temp 98.7 F (37.1 C) (Oral)   Resp 22   Ht 5\' 10"  (1.778 m)   Wt 162 lb (73.5 kg)   SpO2 95%   BMI 23.24 kg/m   Vital signs normal except for tachycardia   Physical Exam  Constitutional: She appears well-developed and well-nourished.  Non-toxic appearance. She does not appear ill. No distress.  Pleasant elderly female  HENT:  Head: Normocephalic and atraumatic.  Right Ear: External ear normal.  Left Ear: External ear normal.  Nose: Nose normal. No mucosal edema or rhinorrhea.  Mouth/Throat: Oropharynx is clear and moist and mucous membranes are normal. No dental abscesses or uvula swelling.  Eyes: Conjunctivae and EOM are normal. Pupils are equal, round, and reactive to light.  Neck: Normal range of motion and full passive range of motion without pain. Neck supple.  Cardiovascular: Normal rate, regular rhythm and normal heart sounds.  Exam reveals no gallop and no friction rub.   No murmur heard. Pulmonary/Chest: Effort normal and breath sounds normal. No respiratory distress. She has no wheezes. She has no rhonchi. She has no rales. She exhibits no tenderness and no crepitus.  Abdominal: Soft. Normal appearance and bowel sounds are normal. She exhibits  no distension. There is no tenderness. There is no rebound and no guarding.  Musculoskeletal: Normal range of motion. She exhibits no edema or tenderness.  Moves all extremities well.   Neurological: She is alert. She has normal strength. No cranial nerve deficit.  Patient appears confused  Skin: Skin is warm, dry and intact. No rash noted. No erythema. No pallor.  Psychiatric: She has a normal mood and affect. Her speech is normal and behavior is normal. Her mood appears not anxious.  Nursing note and vitals reviewed.    ED Treatments / Results  Labs (all labs ordered are listed, but only abnormal results are displayed) Results for orders placed or performed during the hospital encounter of 01/21/16  Comprehensive metabolic panel  Result Value Ref Range   Sodium 142 135 - 145 mmol/L   Potassium 3.6 3.5 - 5.1 mmol/L   Chloride 104 101 - 111 mmol/L   CO2 29 22 - 32 mmol/L   Glucose, Bld 99 65 - 99 mg/dL   BUN 13 6 - 20 mg/dL   Creatinine, Ser 1.610.50 0.44 - 1.00 mg/dL   Calcium 9.2 8.9 - 09.610.3 mg/dL   Total Protein 6.4 (L) 6.5 - 8.1 g/dL   Albumin 3.3 (L) 3.5 - 5.0 g/dL   AST 20 15 - 41 U/L   ALT 15 14 - 54 U/L   Alkaline Phosphatase 73 38 - 126 U/L   Total Bilirubin 0.5 0.3 - 1.2 mg/dL   GFR calc non Af Amer >60 >60 mL/min   GFR calc Af Amer >60 >60 mL/min   Anion gap 9 5 - 15  Lipase, blood  Result Value Ref Range   Lipase 24 11 - 51 U/L  CBC with Differential  Result Value Ref Range   WBC 12.4 (H) 4.0 - 10.5 K/uL   RBC 4.18 3.87 - 5.11 MIL/uL   Hemoglobin 13.3 12.0 - 15.0 g/dL   HCT 04.540.9 40.936.0 - 81.146.0 %   MCV 97.8 78.0 - 100.0 fL   MCH 31.8 26.0 - 34.0 pg   MCHC 32.5 30.0 - 36.0 g/dL   RDW 91.413.7 78.211.5 - 95.615.5 %   Platelets 273 150 - 400 K/uL   Neutrophils Relative % 82 %   Neutro Abs 10.1 (H) 1.7 - 7.7 K/uL   Lymphocytes Relative 9 %   Lymphs Abs 1.1 0.7 - 4.0 K/uL   Monocytes Relative 8 %   Monocytes Absolute 1.0 0.1 - 1.0 K/uL   Eosinophils Relative 1 %   Eosinophils  Absolute 0.1 0.0 - 0.7 K/uL   Basophils Relative 0 %   Basophils Absolute 0.0 0.0 - 0.1 K/uL  Troponin I  Result Value Ref Range   Troponin I <0.03 <0.03 ng/mL   Laboratory interpretation all normal except leukocytosis    EKG  EKG Interpretation  Date/Time:  Thursday January 21 2016 05:13:38 EST Ventricular Rate:  118 PR Interval:    QRS Duration: 80 QT Interval:  362 QTC Calculation: 458 R Axis:   -3 Text Interpretation:  Atrial fibrillation Low voltage, extremity and precordial leads Probable anteroseptal infarct, old Electrode noise No significant change since last tracing 11 Jan 2016 Confirmed by [facility]  MD-I, Aman Bonet ([id number]) on 01/21/2016 7:27:13 AM       Radiology Dg Chest Portable 1 View  Result Date: 01/21/2016 CLINICAL DATA:  Acute onset of generalized abdominal pain and left chest pain. Atrial fibrillation. Initial encounter. EXAM: PORTABLE CHEST 1 VIEW COMPARISON:  Chest radiograph performed 01/11/2016 FINDINGS: The lungs are well-aerated. Mild bibasilar opacities likely reflect atelectasis. There is no evidence of pleural effusion or pneumothorax. The cardiomediastinal silhouette is enlarged. Apparent pneumomediastinum is noted along the right side of the mediastinum. No acute osseous abnormalities are seen. IMPRESSION: 1. Mild bibasilar opacities likely reflect atelectasis. 2. Cardiomegaly. 3. Apparent pneumomediastinum noted. These results were called by telephone at the time of interpretation on 01/21/2016 at 6:49 am to Dr. Devoria AlbeIVA Avelino Herren, who verbally acknowledged these results. Electronically Signed   By: Roanna RaiderJeffery  Chang M.D.   On: 01/21/2016 06:49    Procedures Procedures (including  critical care time)  Medications Ordered in ED Medications - No data to display   Initial Impression / Assessment and Plan / ED Course  I have reviewed the triage vital signs and the nursing notes.  Pertinent labs & imaging results that were available during my care of the patient were  reviewed by me and considered in my medical decision making (see chart for details).  Clinical Course    Testing was ordered for chest pain and abdominal pain. Pt was painfree.   07:20 AM DIL in room, States she was told patient had vomiting all day yesterday at the nursing home. She states this morning they give her her medications on empty stomach which makes her more nauseated. Patient remains pain-free. We discussed her x-ray results. Also discussed need for esophagram to look for esophageal rupture.  Pt left at change of shift with Dr Deretha Emory to get results of her esophagram.    Final Clinical Impressions(s) / ED Diagnoses   Final diagnoses:  Nausea and vomiting, intractability of vomiting not specified, unspecified vomiting type  Pneumomediastinum (HCC)    Disposition pending  Devoria Albe, MD, Concha Pyo, MD 01/21/16 [phone]

## 2016-01-21 NOTE — ED Notes (Signed)
Ann, friend of pt leaving and will be back soon.  If needed call:  [phone].

## 2016-01-21 NOTE — ED Provider Notes (Signed)
Patient turned over to me by the overnight ED physician.  Continued workup. Initial concern for free air from the esophagus on reread of chest x-ray was negated. So barium swallow was canceled. Based on patient's complain I added on CT abdomen and pelvis. And also get a urinalysis. Urinalysis negative for urinary tract infection. CT essentially negative other than a new T10 thoracic vertebrae stable fracture.  Patient improved her significantly with IV fluids. Patient feeling much better. No real chest pain.  Patient will be treated with tramadol for the vertebral fracture and will continue on Zofran for the nausea and vomiting.  Results for orders placed or performed during the hospital encounter of 01/21/16  Comprehensive metabolic panel  Result Value Ref Range   Sodium 142 135 - 145 mmol/L   Potassium 3.6 3.5 - 5.1 mmol/L   Chloride 104 101 - 111 mmol/L   CO2 29 22 - 32 mmol/L   Glucose, Bld 99 65 - 99 mg/dL   BUN 13 6 - 20 mg/dL   Creatinine, Ser 3.080.50 0.44 - 1.00 mg/dL   Calcium 9.2 8.9 - 65.710.3 mg/dL   Total Protein 6.4 (L) 6.5 - 8.1 g/dL   Albumin 3.3 (L) 3.5 - 5.0 g/dL   AST 20 15 - 41 U/L   ALT 15 14 - 54 U/L   Alkaline Phosphatase 73 38 - 126 U/L   Total Bilirubin 0.5 0.3 - 1.2 mg/dL   GFR calc non Af Amer >60 >60 mL/min   GFR calc Af Amer >60 >60 mL/min   Anion gap 9 5 - 15  Lipase, blood  Result Value Ref Range   Lipase 24 11 - 51 U/L  CBC with Differential  Result Value Ref Range   WBC 12.4 (H) 4.0 - 10.5 K/uL   RBC 4.18 3.87 - 5.11 MIL/uL   Hemoglobin 13.3 12.0 - 15.0 g/dL   HCT 84.640.9 96.236.0 - 95.246.0 %   MCV 97.8 78.0 - 100.0 fL   MCH 31.8 26.0 - 34.0 pg   MCHC 32.5 30.0 - 36.0 g/dL   RDW 84.113.7 32.411.5 - 40.115.5 %   Platelets 273 150 - 400 K/uL   Neutrophils Relative % 82 %   Neutro Abs 10.1 (H) 1.7 - 7.7 K/uL   Lymphocytes Relative 9 %   Lymphs Abs 1.1 0.7 - 4.0 K/uL   Monocytes Relative 8 %   Monocytes Absolute 1.0 0.1 - 1.0 K/uL   Eosinophils Relative 1 %   Eosinophils Absolute 0.1 0.0 - 0.7 K/uL   Basophils Relative 0 %   Basophils Absolute 0.0 0.0 - 0.1 K/uL  Troponin I  Result Value Ref Range   Troponin I <0.03 <0.03 ng/mL  Urinalysis, Routine w reflex microscopic ([facility])  Result Value Ref Range   Color, Urine YELLOW YELLOW   APPearance CLEAR CLEAR   Specific Gravity, Urine <1.005 (L) 1.005 - 1.030   pH 7.0 5.0 - 8.0   Glucose, UA NEGATIVE NEGATIVE mg/dL   Hgb urine dipstick LARGE (A) NEGATIVE   Bilirubin Urine NEGATIVE NEGATIVE   Ketones, ur NEGATIVE NEGATIVE mg/dL   Protein, ur NEGATIVE NEGATIVE mg/dL   Nitrite NEGATIVE NEGATIVE   Leukocytes, UA NEGATIVE NEGATIVE  Protime-INR  Result Value Ref Range   Prothrombin Time 25.9 (H) 11.4 - 15.2 seconds   INR 2.32   Urine microscopic-add on  Result Value Ref Range   Squamous Epithelial / LPF 0-5 (A) NONE SEEN   WBC, UA 0-5 0 - 5 WBC/hpf  RBC / HPF 6-30 0 - 5 RBC/hpf   Bacteria, UA RARE (A) NONE SEEN   Crystals CA OXALATE CRYSTALS (A) NEGATIVE   Urine-Other AMORPHOUS URATES/PHOSPHATES    Ct Head Wo Contrast  Result Date: 01/11/2016 CLINICAL DATA:  Altered mental status this morning. EXAM: CT HEAD WITHOUT CONTRAST TECHNIQUE: Contiguous axial images were obtained from the base of the skull through the vertex without intravenous contrast. COMPARISON:  02/24/2013 FINDINGS: Brain: Mild age related volume loss. No acute intracranial abnormality. Specifically, no hemorrhage, hydrocephalus, mass lesion, acute infarction, or significant intracranial injury. Vascular: No hyperdense vessel or unexpected calcification. Skull: No acute calvarial abnormality. Sinuses/Orbits: Visualized paranasal sinuses and mastoids clear. Orbital soft tissues unremarkable. Other:  None IMPRESSION: No acute intracranial abnormality. Electronically Signed   By: Charlett NoseKevin  Dover M.D.   On: 01/11/2016 12:08   Ct Abdomen Pelvis W Contrast  Result Date: 01/21/2016 CLINICAL DATA:  Abdominal pain and nausea for 4  days. EXAM: CT ABDOMEN AND PELVIS WITH CONTRAST TECHNIQUE: Multidetector CT imaging of the abdomen and pelvis was performed using the standard protocol following bolus administration of intravenous contrast. CONTRAST:  100mL ISOVUE-300 IOPAMIDOL (ISOVUE-300) INJECTION 61% COMPARISON:  09/28/2011 and lumbar spine radiographs on 12/11/2014 FINDINGS: Lower Chest: Persistent tiny bilateral pleural effusions versus pleural thickening. Increased cardiomegaly and right atrial enlargement. Hepatobiliary:  No masses identified. Gallbladder is unremarkable. Pancreas:  No mass or inflammatory changes. Spleen: Within normal limits in size and appearance. Adrenals/Urinary Tract: Stable 2.3 cm right adrenal mass, consistent with benign adenoma. A 6 mm calculus is seen in the posterior midpole the right kidney. A 7 mm calculus is also seen within the right renal pelvis. No evidence of ureteral calculi or hydronephrosis. No renal masses identified. Urinary bladder contains a small amount of intraluminal gas, likely due to recent instrumentation, but is otherwise unremarkable. Stomach/Bowel: No evidence of obstruction, inflammatory process or abnormal fluid collections. Vascular/Lymphatic: No pathologically enlarged lymph nodes. No abdominal aortic aneurysm. Aortic atherosclerosis. Reproductive: Previous hysterectomy. A simple appearing cyst is seen in the right adnexa which measures 6.0 x 5.2 cm. This is increased in size from 3.9 x 3.7 cm on 2013 exam. No other pelvic masses or ascites identified. Other:  None. Musculoskeletal: No suspicious lytic or sclerotic bone lesions identified. Lower thoracic and lumbar spine degenerative changes are noted. Old T12 vertebral body compression fracture again demonstrated. An acute fracture is seen through the anterior inferior aspect of the T10 vertebral body. No evidence of spondylolisthesis. IMPRESSION: Acute fracture involving the anterior inferior aspect of the T10 vertebral body. No  evidence of spondylolisthesis. 7 mm calculus within the right renal pelvis and 6 mm calculus in midpole of right kidney. No evidence of ureteral calculi or hydronephrosis. **An incidental finding of potential clinical significance has been found. 6 cm simple appearing right adnexal cyst, which is increased in size from 4 cm on 2013 exam. This is suspicious for a benign or low malignant potential cystic ovarian neoplasm in a postmenopausal female. Recommend further characterization with pelvic ultrasound. This recommendation follows ACR consensus guidelines: White Paper of the ACR Incidental Findings Committee II on Adnexal Findings. J Am Coll Radiol [phone]:[id number].** Intraluminal gas noted within urinary bladder. Recommend clinical correlation for recent instrumentation. Increased cardiomegaly and right atrial enlargement since 2013 exam. Electronically Signed   By: Myles RosenthalJohn  Stahl M.D.   On: 01/21/2016 11:16   Dg Chest Portable 1 View  Addendum Date: 01/21/2016   ADDENDUM REPORT: 01/21/2016 08:55 ADDENDUM: Reviewed chest radiograph in anticipation of  performing an esophagram for potential esophageal perforation. In retrospect, the suspected pneumomediastinum along the RIGHT mediastinal border is actually an artifact created by near apposition of the mediastinal border and the medial border of the RIGHT scapula. No pneumomediastinum is identified. Discussed with Dr. Deretha Emory. Esophagram was not performed. Electronically Signed   By: Ulyses Southward M.D.   On: 01/21/2016 08:55   Result Date: 01/21/2016 CLINICAL DATA:  Acute onset of generalized abdominal pain and left chest pain. Atrial fibrillation. Initial encounter. EXAM: PORTABLE CHEST 1 VIEW COMPARISON:  Chest radiograph performed 01/11/2016 FINDINGS: The lungs are well-aerated. Mild bibasilar opacities likely reflect atelectasis. There is no evidence of pleural effusion or pneumothorax. The cardiomediastinal silhouette is enlarged. Apparent pneumomediastinum  is noted along the right side of the mediastinum. No acute osseous abnormalities are seen. IMPRESSION: 1. Mild bibasilar opacities likely reflect atelectasis. 2. Cardiomegaly. 3. Apparent pneumomediastinum noted. These results were called by telephone at the time of interpretation on 01/21/2016 at 6:49 am to Dr. Devoria Albe, who verbally acknowledged these results. Electronically Signed: By: Roanna Raider M.D. On: 01/21/2016 06:49   Dg Chest Port 1 View  Result Date: 01/11/2016 CLINICAL DATA:  Altered mental status. EXAM: PORTABLE CHEST 1 VIEW COMPARISON:  12/19/2014 FINDINGS: Chronic prominence of the cardiac silhouette. No acute infiltrates or effusions. Chronic slight accentuation of the interstitial markings. No acute bone abnormality. IMPRESSION: No acute abnormality.  Chronic cardiomegaly. Electronically Signed   By: Francene Boyers M.D.   On: 01/11/2016 11:33      Vanetta Mulders, MD 01/21/16 1453

## 2016-01-21 NOTE — ED Notes (Signed)
Report given to Jeremy JohannAngela [place] [facility].

## 2016-01-21 NOTE — Discharge Instructions (Signed)
Glad that you're feeling better with the IV fluids. Take Zofran as needed for nausea and vomiting. Workup here today found a tractor to your 10th thoracic vertebrae. Take the tramadol as needed for pain for that. Referral to orthopedics provided for further follow-up of this.

## 2016-01-21 NOTE — ED Notes (Signed)
Dr Lars MageI. Knapp in.

## 2016-01-21 NOTE — ED Notes (Signed)
Pt states that she has been waking up for the past 3-4 days sick to her stomach she states that she was hurting in her abd up into her left chest wall. Pt denies nausea or pain at triage pt is showing afib on the monitor with a rate from 80's to the 110's.

## 2016-01-26 ENCOUNTER — Encounter: Payer: Self-pay | Admitting: Orthopaedic Surgery

## 2016-01-26 ENCOUNTER — Ambulatory Visit (INDEPENDENT_AMBULATORY_CARE_PROVIDER_SITE_OTHER): Payer: Medicare Other | Admitting: Orthopaedic Surgery

## 2016-01-26 VITALS — BP 110/69 | HR 72 | Temp 97.0°F

## 2016-01-26 DIAGNOSIS — S22000A Wedge compression fracture of unspecified thoracic vertebra, initial encounter for closed fracture: Secondary | ICD-10-CM

## 2016-01-26 NOTE — Progress Notes (Signed)
Subjective:    Patient ID: Tamara HaggardBetty S [name], female    DOB: 03/01/1924, 82 y.o.   MRN: [id number]  HPI She had a fall at the nursing home where she is a resident about two weeks ago. She was seen in the ER on 01-21-16 for marked nausea and vomiting.  In the course of the work-up she had a CT scan of the abdomen showing a compression fracture acute of T10.  She is better from the nausea but she has back pain now.  She has history of old T12 compression fracture.  She has multiple medical problems and is on Coumadin.   Review of Systems  HENT: Negative for congestion.   Respiratory: Positive for cough and shortness of breath.   Cardiovascular: Negative for chest pain and leg swelling.  Gastrointestinal: Positive for abdominal pain, nausea and vomiting.  Endocrine: Positive for cold intolerance.  Musculoskeletal: Positive for arthralgias and back pain.  Allergic/Immunologic: Positive for environmental allergies.   Past Medical History:  Diagnosis Date  . Acute colitis 09/2011   C. difficile negative  . Arthritis   . Atrial fibrillation (HCC)    Onset well before 2004; Negative stress nuclear study in 01/2003  . Back pain   . Chronic anticoagulation   . Dementia   . Depression   . Fractures   . GERD (gastroesophageal reflux disease)   . Hiatal hernia    by CT in 2013; also noted was an adrenal adenoma, duodenal lipoma right ovarian cyst, post hysterectomy  . History of spinal fracture   . Hypercalcemia   . Hyperlipidemia   . Hypertension   . Osteoporosis   . Pericardial effusion   . Pneumonia 09/2011   09/2011  . Syncope   . Thyroid disease    hypothyroid    Past Surgical History:  Procedure Laterality Date  . ABDOMINAL HYSTERECTOMY     No oophorectomy  . APPENDECTOMY    . COLONOSCOPY  2005   Reportedly normal screening study    Current Outpatient Prescriptions on File Prior to Visit  Medication Sig Dispense Refill  . diltiazem (CARDIZEM) 120 MG tablet Take 120 mg  by mouth daily.    Marland Kitchen. docusate sodium 100 MG CAPS Take 100 mg by mouth 2 (two) times daily. 10 capsule 0  . ENSURE (ENSURE) Take 237 mLs by mouth daily.    Marland Kitchen. escitalopram (LEXAPRO) 10 MG tablet Take 1 tablet (10 mg total) by mouth daily. 30 tablet 3  . ibuprofen (ADVIL,MOTRIN) 400 MG tablet Take 1 tablet (400 mg total) by mouth every 6 (six) hours as needed for moderate pain. 30 tablet 0  . levothyroxine (SYNTHROID, LEVOTHROID) 100 MCG tablet Take 100 mcg by mouth daily before breakfast.    . memantine (NAMENDA) 10 MG tablet Take 10 mg by mouth 2 (two) times daily. Administered at 8am and 5pm    . omeprazole (PRILOSEC) 40 MG capsule Take 1 capsule (40 mg total) by mouth daily. For heartburn 30 capsule 3  . ondansetron (ZOFRAN ODT) 4 MG disintegrating tablet Take 1 tablet (4 mg total) by mouth every 8 (eight) hours as needed. 10 tablet 1  . Polyethyl Glycol-Propyl Glycol (SYSTANE OP) Place 1 drop into both eyes 2 (two) times daily. 1 drop into both eyes    . polyethylene glycol (MIRALAX / GLYCOLAX) packet Take 17 g by mouth daily as needed for mild constipation.    . traMADol (ULTRAM) 50 MG tablet Take 1 tablet (50 mg total) by mouth  every 6 (six) hours as needed. 20 tablet 0  . warfarin (COUMADIN) 4 MG tablet Take 4-6 mg by mouth See admin instructions. 4mg  on Sundays, Tuesdays, Wednesdays, Thursdays, Fridays, and Saturdays. Take 6mg  on Mondays    . warfarin (COUMADIN) 6 MG tablet Take 6 mg by mouth daily. Every monday    . acetaminophen (TYLENOL) 500 MG tablet Take 1,000 mg by mouth 2 (two) times daily.    . cefUROXime (CEFTIN) 500 MG tablet Take 1 tablet (500 mg total) by mouth 2 (two) times daily with a meal. Start 11/23. (Patient not taking: Reported on 01/26/2016) 4 tablet 0   No current facility-administered medications on file prior to visit.     Social History   Social History  . Marital status: Widowed    Spouse name: N/A  . Number of children: N/A  . Years of education: N/A    Occupational History  . Retired    Social History Main Topics  . Smoking status: Never Smoker  . Smokeless tobacco: Never Used  . Alcohol use No  . Drug use: No  . Sexual activity: Not Currently   Other Topics Concern  . Not on file   Social History Narrative  . No narrative on file    Family History  Problem Relation Age of Onset  . Cancer Mother     kidney  . Heart attack Father   . Hypertension Father     BP 110/69   Pulse 72   Temp 97 F (36.1 C)      Objective:   Physical Exam  Constitutional: She is oriented to person, place, and time. She appears well-developed and well-nourished.  HENT:  Head: Normocephalic and atraumatic.  Eyes: Conjunctivae and EOM are normal. Pupils are equal, round, and reactive to light.  Neck: Normal range of motion. Neck supple.  Cardiovascular: Normal rate, regular rhythm and intact distal pulses.   Pulmonary/Chest: Effort normal.  Abdominal: Soft.  Musculoskeletal: She exhibits tenderness (She is in wheelchair and has mid back pain.  She has normal NV status.  She is alert and oriented today.  SLR is negative.).       Thoracic back: She exhibits tenderness.       Back:  Neurological: She is alert and oriented to person, place, and time. She displays normal reflexes. No cranial nerve deficit. She exhibits normal muscle tone. Coordination normal.  Skin: Skin is warm and dry.  Psychiatric: She has a normal mood and affect. Her behavior is normal. Judgment and thought content normal.          Assessment & Plan:   Encounter Diagnosis  Name Primary?  . Compression fracture of thoracic vertebra, closed, initial encounter (HCC) Yes   She is fitted and given a CASH brace with instructions for use.  I will see her back in one week for re-examination.  She is to wear the brace when sitting.  Remove it for sleep, bathing and naps.  Continue the Tramadol she has already for pain.  Call if any problem.  Precautions  given.  Electronically Signed Darreld McleanWayne Denita Lun, MD 12/5/201710:07 AM

## 2016-02-02 ENCOUNTER — Ambulatory Visit (INDEPENDENT_AMBULATORY_CARE_PROVIDER_SITE_OTHER): Payer: Self-pay | Admitting: Orthopaedic Surgery

## 2016-02-02 VITALS — BP 105/69 | HR 47 | Temp 97.2°F | Resp 16 | Wt 151.0 lb

## 2016-02-02 DIAGNOSIS — S22000A Wedge compression fracture of unspecified thoracic vertebra, initial encounter for closed fracture: Secondary | ICD-10-CM

## 2016-02-02 NOTE — Progress Notes (Signed)
CC:  She has been nauseous  (daughter gives information)  The patient has had nausea for the last week.  She throws up after taking the Ultram.  I will stop it.  I have ordered Tylenol for pain.  The CASH brace needs adjustment and was adjusted today.  NV intact.    Encounter Diagnosis  Name Primary?  . Compression fracture of thoracic vertebra, closed, initial encounter (HCC) Yes   Return in three weeks.  New orders for rest home given.  Call if any problem.  X-rays of thoracic spine on return.  Continue CASH brace.  Electronically Signed Darreld McleanWayne Dina Warbington, MD 12/12/20172:20 PM

## 2016-02-15 ENCOUNTER — Emergency Department (HOSPITAL_COMMUNITY)
Admission: EM | Admit: 2016-02-15 | Discharge: 2016-02-15 | Disposition: A | Discharge status: Home / Self Care | Payer: Medicare Other | Attending: Emergency Medicine | Admitting: Emergency Medicine

## 2016-02-15 ENCOUNTER — Encounter (HOSPITAL_COMMUNITY): Payer: Self-pay | Admitting: Emergency Medicine

## 2016-02-15 DIAGNOSIS — E039 Hypothyroidism, unspecified: Secondary | ICD-10-CM | POA: Diagnosis not present

## 2016-02-15 DIAGNOSIS — R791 Abnormal coagulation profile: Secondary | ICD-10-CM

## 2016-02-15 DIAGNOSIS — N939 Abnormal uterine and vaginal bleeding, unspecified: Secondary | ICD-10-CM | POA: Diagnosis present

## 2016-02-15 DIAGNOSIS — Z7901 Long term (current) use of anticoagulants: Secondary | ICD-10-CM | POA: Insufficient documentation

## 2016-02-15 DIAGNOSIS — Z79899 Other long term (current) drug therapy: Secondary | ICD-10-CM | POA: Insufficient documentation

## 2016-02-15 DIAGNOSIS — R319 Hematuria, unspecified: Secondary | ICD-10-CM | POA: Insufficient documentation

## 2016-02-15 DIAGNOSIS — E876 Hypokalemia: Secondary | ICD-10-CM | POA: Insufficient documentation

## 2016-02-15 DIAGNOSIS — I10 Essential (primary) hypertension: Secondary | ICD-10-CM | POA: Diagnosis not present

## 2016-02-15 LAB — URINALYSIS, ROUTINE W REFLEX MICROSCOPIC
BILIRUBIN URINE: NEGATIVE
GLUCOSE, UA: NEGATIVE mg/dL
KETONES UR: NEGATIVE mg/dL
Nitrite: NEGATIVE
PROTEIN: 100 mg/dL — AB
Specific Gravity, Urine: 1.025 (ref 1.005–1.030)
pH: 6.5 (ref 5.0–8.0)

## 2016-02-15 LAB — BASIC METABOLIC PANEL
Anion gap: 7 (ref 5–15)
BUN: 11 mg/dL (ref 6–20)
CALCIUM: 9.2 mg/dL (ref 8.9–10.3)
CO2: 28 mmol/L (ref 22–32)
CREATININE: 0.6 mg/dL (ref 0.44–1.00)
Chloride: 106 mmol/L (ref 101–111)
GFR calc non Af Amer: >60 mL/min (ref >60)
Glucose, Bld: 112 mg/dL — ABNORMAL HIGH (ref 65–99)
Potassium: 2.9 mmol/L — ABNORMAL LOW (ref 3.5–5.1)
SODIUM: 141 mmol/L (ref 135–145)

## 2016-02-15 LAB — CBC
HCT: 40 % (ref 36.0–46.0)
Hemoglobin: 13 g/dL (ref 12.0–15.0)
MCH: 32 pg (ref 26.0–34.0)
MCHC: 32.5 g/dL (ref 30.0–36.0)
MCV: 98.5 fL (ref 78.0–100.0)
Platelets: 252 10*3/uL (ref 150–400)
RBC: 4.06 MIL/uL (ref 3.87–5.11)
RDW: 14.2 % (ref 11.5–15.5)
WBC: 11.6 10*3/uL — ABNORMAL HIGH (ref 4.0–10.5)

## 2016-02-15 LAB — URINALYSIS, MICROSCOPIC (REFLEX)
Bacteria, UA: NONE SEEN
SQUAMOUS EPITHELIAL / LPF: NONE SEEN

## 2016-02-15 LAB — PROTIME-INR
INR: 7.75 — AB
PROTHROMBIN TIME: 67.9 s — AB (ref 11.4–15.2)

## 2016-02-15 MED ORDER — POTASSIUM CHLORIDE CRYS ER 20 MEQ PO TBCR: 20.0000 meq | EXTENDED_RELEASE_TABLET | Freq: Two times a day (BID) | ORAL | 0 refills | Status: DC

## 2016-02-15 MED ORDER — POTASSIUM CHLORIDE CRYS ER 20 MEQ PO TBCR
40.0000 meq | EXTENDED_RELEASE_TABLET | Freq: Once | ORAL | Status: AC
  Administered 2016-02-15: 40 meq via ORAL
  Filled 2016-02-15: qty 2

## 2016-02-15 MED ORDER — VITAMIN K1 10 MG/ML IJ SOLN
10.0000 mg | Freq: Once | INTRAMUSCULAR | Status: AC
  Administered 2016-02-15: 10 mg via SUBCUTANEOUS
  Filled 2016-02-15: qty 1

## 2016-02-15 NOTE — ED Notes (Addendum)
inr 7.75  CRITICAL VALUE ALERT  Critical value received:INR 7.75  Date of notification: 02/15/16  Time of notification:  1107  Critical value read back:Yes.    Nurse who received alert: Docia BarrierMeagan Earl Losee  MD notified (1st page):  Dr. Rosalia Hammersay

## 2016-02-15 NOTE — ED Triage Notes (Signed)
Pt's daughter in law reports [facility] and said pt was having vaginal bleeding noticed this morning.  Pt denies any complaints.

## 2016-02-15 NOTE — ED Provider Notes (Signed)
[facility]-EMERGENCY DEPT Provider Note   CSN: [id number] Arrival date & time: 02/15/16  4098  By signing my name below, I, Nelwyn Salisbury, attest that this documentation has been prepared under the direction and in the presence of Margarita Grizzle, MD . Electronically Signed: Nelwyn Salisbury, Scribe. 02/15/2016. 9:54 AM.  History   Chief Complaint Chief Complaint  Patient presents with  . Vaginal Bleeding   The history is provided by the patient and a relative. No language interpreter was used.   HPI Comments:  Tamara [name] is a 82 y.o. female with pmhx of AFIB, HLD, HTN and dementia who presents to the Emergency Department complaining of intermitting unchanged vaginal bleeding beginning a few days ago. Pt's daughter states that one of the employee's at the pt's assisted living facility told her that the pt has had some vaginal bleeding, but did not specify where where she found the blood. Pt reports associated abdominal tenderness and lower back pain exacerbated by certain positions. Pt's daughter states that the pt fell at her nursing home about a month ago and broke her T10 vertebrae. She also states the pt fell again about 3 days ago but was okay and did not injure herself. Pt denies any other symptoms.    Past Medical History:  Diagnosis Date  . Acute colitis 09/2011   C. difficile negative  . Arthritis   . Atrial fibrillation (HCC)    Onset well before 2004; Negative stress nuclear study in 01/2003  . Back pain   . Chronic anticoagulation   . Dementia   . Depression   . Fractures   . GERD (gastroesophageal reflux disease)   . Hiatal hernia    by CT in 2013; also noted was an adrenal adenoma, duodenal lipoma right ovarian cyst, post hysterectomy  . History of spinal fracture   . Hypercalcemia   . Hyperlipidemia   . Hypertension   . Osteoporosis   . Pericardial effusion   . Pneumonia 09/2011   09/2011  . Syncope   . Thyroid disease    hypothyroid    Patient Active Problem  List   Diagnosis Date Noted  . UTI (urinary tract infection) 01/11/2016  . Dementia 01/11/2016  . Hypothyroidism 01/11/2016  . GERD (gastroesophageal reflux disease) 01/11/2016  . Gram-negative bacteremia 05/28/2014  . UTI (lower urinary tract infection) 05/28/2014  . Sepsis (HCC) 05/28/2014  . Hypokalemia 02/25/2013  . Hematuria 02/25/2013  . Fever 02/24/2013  . Acute bronchitis 02/24/2013  . Back pain 02/24/2013  . Left wrist fracture 02/24/2013  . Influenza due to identified novel influenza A virus with other respiratory manifestations 02/24/2013  . Degenerative joint disease 08/17/2012  . Hypercalcemia 02/29/2012  . Acute pericardial effusion 02/25/2012  . Hypertension   . Chronic anticoagulation   . Hyperlipidemia   . Atrial fibrillation (HCC) 09/28/2011  . Acute encephalopathy 09/28/2011    Past Surgical History:  Procedure Laterality Date  . ABDOMINAL HYSTERECTOMY     No oophorectomy  . APPENDECTOMY    . COLONOSCOPY  2005   Reportedly normal screening study    OB History    Gravida Para Term Preterm AB Living   2 2 2          SAB TAB Ectopic Multiple Live Births                   Home Medications    Prior to Admission medications   Medication Sig Start Date End Date Taking? Authorizing Provider  acetaminophen (TYLENOL)  500 MG tablet Take 1,000 mg by mouth 2 (two) times daily.    Historical Provider, MD  cefUROXime (CEFTIN) 500 MG tablet Take 1 tablet (500 mg total) by mouth 2 (two) times daily with a meal. Start 11/23. Patient not taking: Reported on 01/26/2016 01/14/16   Standley Brookinganiel P Goodrich, MD  diltiazem (CARDIZEM) 120 MG tablet Take 120 mg by mouth daily. 02/06/13   Historical Provider, MD  docusate sodium 100 MG CAPS Take 100 mg by mouth 2 (two) times daily. 02/26/13   Tora KindredMarianne L York, PA-C  ENSURE (ENSURE) Take 237 mLs by mouth daily.    Historical Provider, MD  escitalopram (LEXAPRO) 10 MG tablet Take 1 tablet (10 mg total) by mouth daily. 05/14/12    Sharon SellerJessica K Eubanks, NP  ibuprofen (ADVIL,MOTRIN) 400 MG tablet Take 1 tablet (400 mg total) by mouth every 6 (six) hours as needed for moderate pain. 07/21/15   Marily MemosJason Mesner, MD  levothyroxine (SYNTHROID, LEVOTHROID) 100 MCG tablet Take 100 mcg by mouth daily before breakfast.    Historical Provider, MD  memantine (NAMENDA) 10 MG tablet Take 10 mg by mouth 2 (two) times daily. Administered at 8am and 5pm    Historical Provider, MD  omeprazole (PRILOSEC) 40 MG capsule Take 1 capsule (40 mg total) by mouth daily. For heartburn 05/14/12   Sharon SellerJessica K Eubanks, NP  ondansetron (ZOFRAN ODT) 4 MG disintegrating tablet Take 1 tablet (4 mg total) by mouth every 8 (eight) hours as needed. 01/21/16   Vanetta MuldersScott Zackowski, MD  Polyethyl Glycol-Propyl Glycol (SYSTANE OP) Place 1 drop into both eyes 2 (two) times daily. 1 drop into both eyes    Historical Provider, MD  polyethylene glycol (MIRALAX / GLYCOLAX) packet Take 17 g by mouth daily as needed for mild constipation.    Historical Provider, MD  warfarin (COUMADIN) 4 MG tablet Take 4-6 mg by mouth See admin instructions. 4mg  on Sundays, Tuesdays, Wednesdays, Thursdays, Fridays, and Saturdays. Take 6mg  on Mondays    Historical Provider, MD  warfarin (COUMADIN) 6 MG tablet Take 6 mg by mouth daily. Every monday    Historical Provider, MD    Family History Family History  Problem Relation Age of Onset  . Cancer Mother     kidney  . Heart attack Father   . Hypertension Father     Social History Social History  Substance Use Topics  . Smoking status: Never Smoker  . Smokeless tobacco: Never Used  . Alcohol use No     Allergies   Patient has no known allergies.   Review of Systems Review of Systems  Gastrointestinal: Positive for abdominal pain.  Genitourinary: Positive for vaginal bleeding.  Musculoskeletal: Positive for back pain.  All other systems reviewed and are negative.    Physical Exam Updated Vital Signs BP 133/81 (BP Location: Left  Arm)   Pulse 84   Temp 98.1 F (36.7 C) (Oral)   Resp 18   Ht 5\' 10"  (1.778 m)   Wt 151 lb (68.5 kg)   SpO2 97%   BMI 21.67 kg/m   Physical Exam  Constitutional: She is oriented to person, place, and time. She appears well-developed and well-nourished. No distress.  HENT:  Head: Normocephalic and atraumatic.  Right Ear: External ear normal.  Left Ear: External ear normal.  Nose: Nose normal.  Eyes: Conjunctivae and EOM are normal. Pupils are equal, round, and reactive to light.  Neck: Normal range of motion. Neck supple.  Pulmonary/Chest: Effort normal.  Musculoskeletal: Normal range  of motion.  Neurological: She is alert and oriented to person, place, and time. She exhibits normal muscle tone. Coordination normal.  Skin: Skin is warm and dry.  Psychiatric: She has a normal mood and affect. Her behavior is normal. Thought content normal.  Nursing note and vitals reviewed.    ED Treatments / Results  DIAGNOSTIC STUDIES:  Oxygen Saturation is 97% on Ra, normal by my interpretation.    COORDINATION OF CARE:  9:54 AM Discussed treatment plan with pt's daughter-in-law at bedside which includes contact with assisted living facility for more details about the discovery of the vaginal bleeding and she agreed to plan.  11:30 AM Spoke with patient about discontinuation Coumadin.  Labs (all labs ordered are listed, but only abnormal results are displayed) Labs Reviewed  URINALYSIS, ROUTINE W REFLEX MICROSCOPIC - Abnormal; Notable for the following:       Result Value   Color, Urine ORANGE (*)    APPearance CLOUDY (*)    Hgb urine dipstick LARGE (*)    Protein, ur 100 (*)    Leukocytes, UA TRACE (*)    All other components within normal limits  CBC - Abnormal; Notable for the following:    WBC 11.6 (*)    All other components within normal limits  BASIC METABOLIC PANEL - Abnormal; Notable for the following:    Potassium 2.9 (*)    Glucose, Bld 112 (*)    All other  components within normal limits  PROTIME-INR - Abnormal; Notable for the following:    Prothrombin Time 67.9 (*)    INR 7.75 (*)    All other components within normal limits  URINALYSIS, MICROSCOPIC (REFLEX)    EKG  EKG Interpretation None       Radiology No results found.  Procedures Procedures (including critical care time)  Medications Ordered in ED Medications  potassium chloride SA (K-DUR,KLOR-CON) CR tablet 40 mEq (not administered)  phytonadione (VITAMIN K) SQ injection 10 mg (not administered)     Initial Impression / Assessment and Plan / ED Course  I have reviewed the triage vital signs and the nursing notes.  Pertinent labs & imaging results that were available during my care of the patient were reviewed by me and considered in my medical decision making (see chart for details).  Clinical Course   82 year old female on chronic anticoagulation for atrial fibrillation presents stating for vaginal bleeding. Here vaginal and rectal exam revealed no evidence of bleeding. She does have hematuria on her catheterized urine. Her INR is elevated at 7.75. She is given vitamin K here. Family is advised that she should not take anymore Coumadin until she is rechecked by her primary care physician. Discharge instructions included instructions to hold Coumadin. She is also hypokalemic. Potassium is adequate.    Final Clinical Impressions(s) / ED Diagnoses   Final diagnoses:  Hematuria, unspecified type  Supratherapeutic INR  Hypokalemia    New Prescriptions New Prescriptions   No medications on file  I personally performed the services described in this documentation, which was scribed in my presence. The recorded information has been reviewed and considered.    Margarita Grizzleanielle Johnathon Olden, MD 02/15/16 [phone]

## 2016-02-15 NOTE — Discharge Instructions (Signed)
Do not take coumadin.  Call Dr. Scharlene GlossHall's office tomorrow for recheck of INR.  Return if any excessive bleeding before recheck.   Potassium is also low and needs to be orally repleted and rechecked.

## 2016-02-17 LAB — URINE CULTURE: Culture: NO GROWTH

## 2016-02-23 ENCOUNTER — Ambulatory Visit: Payer: Self-pay | Admitting: Orthopaedic Surgery

## 2016-02-24 ENCOUNTER — Encounter: Payer: Self-pay | Admitting: Orthopaedic Surgery

## 2016-02-24 ENCOUNTER — Ambulatory Visit (INDEPENDENT_AMBULATORY_CARE_PROVIDER_SITE_OTHER): Payer: Medicare Other

## 2016-02-24 ENCOUNTER — Ambulatory Visit (INDEPENDENT_AMBULATORY_CARE_PROVIDER_SITE_OTHER): Payer: Self-pay | Admitting: Orthopaedic Surgery

## 2016-02-24 VITALS — BP 128/71 | HR 84 | Temp 97.3°F

## 2016-02-24 DIAGNOSIS — M4854XA Collapsed vertebra, not elsewhere classified, thoracic region, initial encounter for fracture: Secondary | ICD-10-CM | POA: Diagnosis not present

## 2016-02-24 DIAGNOSIS — S22000A Wedge compression fracture of unspecified thoracic vertebra, initial encounter for closed fracture: Secondary | ICD-10-CM

## 2016-02-24 NOTE — Progress Notes (Signed)
CC:  My back is better  She has less pain of the back.  She is in a CASH brace.  She is resident at local rest home.  She was hospitalized over Christmas due to markedly elevated INR.  She is doing well now.  She has no numbness.  CASH brace fits well.  She has no pain of the back.  X-rays were done, reported separately.  Encounter Diagnosis  Name Primary?  . Compression fracture of body of thoracic vertebra (HCC) Yes   Return in one month.  X-rays on return.  Call if any problem.  Wear the CASH brace.  Electronically Signed Darreld McleanWayne Genella Bas, MD 1/3/20182:42 PM

## 2016-02-27 ENCOUNTER — Other Ambulatory Visit (HOSPITAL_COMMUNITY)
Admission: RE | Admit: 2016-02-27 | Discharge: 2016-02-27 | Disposition: A | Discharge status: Home / Self Care | Payer: Medicare Other | Source: Other Acute Inpatient Hospital | Attending: Orthopaedic Surgery | Admitting: Orthopaedic Surgery

## 2016-02-27 DIAGNOSIS — Z7902 Long term (current) use of antithrombotics/antiplatelets: Secondary | ICD-10-CM | POA: Diagnosis present

## 2016-02-27 LAB — PROTIME-INR
INR: 3.03
Prothrombin Time: 32.1 seconds — ABNORMAL HIGH (ref 11.4–15.2)

## 2016-03-01 ENCOUNTER — Other Ambulatory Visit (HOSPITAL_COMMUNITY)
Admission: RE | Admit: 2016-03-01 | Discharge: 2016-03-01 | Disposition: A | Discharge status: Home / Self Care | Payer: Medicare Other | Source: Other Acute Inpatient Hospital | Attending: Orthopaedic Surgery | Admitting: Orthopaedic Surgery

## 2016-03-01 DIAGNOSIS — Z7902 Long term (current) use of antithrombotics/antiplatelets: Secondary | ICD-10-CM | POA: Insufficient documentation

## 2016-03-01 LAB — PROTIME-INR
INR: 3.71
PROTHROMBIN TIME: 37.7 s — AB (ref 11.4–15.2)

## 2016-03-03 ENCOUNTER — Other Ambulatory Visit (HOSPITAL_COMMUNITY)
Admission: RE | Admit: 2016-03-03 | Discharge: 2016-03-03 | Disposition: A | Discharge status: Home / Self Care | Payer: Medicare Other | Source: Other Acute Inpatient Hospital | Attending: Orthopaedic Surgery | Admitting: Orthopaedic Surgery

## 2016-03-03 DIAGNOSIS — Z7902 Long term (current) use of antithrombotics/antiplatelets: Secondary | ICD-10-CM | POA: Insufficient documentation

## 2016-03-03 LAB — PROTIME-INR
INR: 3.02
Prothrombin Time: 32 seconds — ABNORMAL HIGH (ref 11.4–15.2)

## 2016-03-10 ENCOUNTER — Other Ambulatory Visit (HOSPITAL_COMMUNITY)
Admission: RE | Admit: 2016-03-10 | Discharge: 2016-03-10 | Disposition: A | Discharge status: Home / Self Care | Payer: Medicare Other | Source: Other Acute Inpatient Hospital | Attending: Orthopaedic Surgery | Admitting: Orthopaedic Surgery

## 2016-03-10 DIAGNOSIS — Z7901 Long term (current) use of anticoagulants: Secondary | ICD-10-CM | POA: Diagnosis present

## 2016-03-10 LAB — PROTIME-INR
INR: 1.45
Prothrombin Time: 17.8 seconds — ABNORMAL HIGH (ref 11.4–15.2)

## 2016-03-17 ENCOUNTER — Other Ambulatory Visit (HOSPITAL_COMMUNITY)
Admission: RE | Admit: 2016-03-17 | Discharge: 2016-03-17 | Disposition: A | Discharge status: Home / Self Care | Payer: Medicare Other | Source: Other Acute Inpatient Hospital | Attending: Orthopaedic Surgery | Admitting: Orthopaedic Surgery

## 2016-03-17 DIAGNOSIS — Z7901 Long term (current) use of anticoagulants: Secondary | ICD-10-CM | POA: Diagnosis present

## 2016-03-17 LAB — PROTIME-INR
INR: 1.31
PROTHROMBIN TIME: 16.4 s — AB (ref 11.4–15.2)

## 2016-03-21 ENCOUNTER — Other Ambulatory Visit (HOSPITAL_COMMUNITY)
Admission: AD | Admit: 2016-03-21 | Discharge: 2016-03-21 | Disposition: A | Discharge status: Home / Self Care | Payer: Medicare Other | Source: Other Acute Inpatient Hospital | Attending: Family Medicine | Admitting: Family Medicine

## 2016-03-21 DIAGNOSIS — Z7901 Long term (current) use of anticoagulants: Secondary | ICD-10-CM | POA: Diagnosis present

## 2016-03-21 LAB — PROTIME-INR
INR: 1.29
Prothrombin Time: 16.2 seconds — ABNORMAL HIGH (ref 11.4–15.2)

## 2016-03-23 ENCOUNTER — Ambulatory Visit (INDEPENDENT_AMBULATORY_CARE_PROVIDER_SITE_OTHER): Payer: Medicare Other

## 2016-03-23 ENCOUNTER — Ambulatory Visit (INDEPENDENT_AMBULATORY_CARE_PROVIDER_SITE_OTHER): Payer: Self-pay | Admitting: Orthopaedic Surgery

## 2016-03-23 DIAGNOSIS — M4854XA Collapsed vertebra, not elsewhere classified, thoracic region, initial encounter for fracture: Secondary | ICD-10-CM

## 2016-03-23 DIAGNOSIS — S22000A Wedge compression fracture of unspecified thoracic vertebra, initial encounter for closed fracture: Secondary | ICD-10-CM

## 2016-03-23 NOTE — Progress Notes (Signed)
CC:  My back is better  She has compression fracture of T11.  She is using the CASH brace.  NV is intact.  She has no spasm.  X-rays were done of the spine.  Encounter Diagnosis  Name Primary?  . Compression fracture of body of thoracic vertebra (HCC) Yes   Return in one month.  Continue the CASH brace.  Call if any problem.  Precautions discussed.  Electronically Signed Darreld McleanWayne Abas Leicht, MD 1/31/20182:15 PM

## 2016-03-28 ENCOUNTER — Other Ambulatory Visit (HOSPITAL_COMMUNITY)
Admission: AD | Admit: 2016-03-28 | Discharge: 2016-03-28 | Disposition: A | Discharge status: Home / Self Care | Payer: Medicare Other | Source: Other Acute Inpatient Hospital | Attending: Family Medicine | Admitting: Family Medicine

## 2016-03-28 DIAGNOSIS — Z7901 Long term (current) use of anticoagulants: Secondary | ICD-10-CM | POA: Insufficient documentation

## 2016-03-28 LAB — PROTIME-INR
INR: 1.58
Prothrombin Time: 19 seconds — ABNORMAL HIGH (ref 11.4–15.2)

## 2016-03-31 ENCOUNTER — Other Ambulatory Visit (HOSPITAL_COMMUNITY)
Admission: RE | Admit: 2016-03-31 | Discharge: 2016-03-31 | Disposition: A | Discharge status: Home / Self Care | Payer: Medicare Other | Source: Other Acute Inpatient Hospital | Attending: Family Medicine | Admitting: Family Medicine

## 2016-03-31 DIAGNOSIS — I482 Chronic atrial fibrillation: Secondary | ICD-10-CM | POA: Diagnosis present

## 2016-03-31 DIAGNOSIS — Z7902 Long term (current) use of antithrombotics/antiplatelets: Secondary | ICD-10-CM | POA: Insufficient documentation

## 2016-03-31 LAB — PROTIME-INR
INR: 1.64
Prothrombin Time: 19.6 seconds — ABNORMAL HIGH (ref 11.4–15.2)

## 2016-04-07 ENCOUNTER — Other Ambulatory Visit (HOSPITAL_COMMUNITY)
Admission: RE | Admit: 2016-04-07 | Discharge: 2016-04-07 | Disposition: A | Discharge status: Home / Self Care | Payer: Medicare Other | Source: Other Acute Inpatient Hospital | Attending: Family Medicine | Admitting: Family Medicine

## 2016-04-07 DIAGNOSIS — Z7901 Long term (current) use of anticoagulants: Secondary | ICD-10-CM | POA: Insufficient documentation

## 2016-04-07 LAB — PROTIME-INR
INR: 1.56
PROTHROMBIN TIME: 18.9 s — AB (ref 11.4–15.2)

## 2016-04-13 ENCOUNTER — Other Ambulatory Visit (HOSPITAL_COMMUNITY)
Admission: AD | Admit: 2016-04-13 | Discharge: 2016-04-13 | Disposition: A | Discharge status: Home / Self Care | Payer: Medicare Other | Source: Skilled Nursing Facility | Attending: Family Medicine | Admitting: Family Medicine

## 2016-04-13 DIAGNOSIS — Z7901 Long term (current) use of anticoagulants: Secondary | ICD-10-CM | POA: Diagnosis present

## 2016-04-13 LAB — PROTIME-INR
INR: 1.6
Prothrombin Time: 19.2 seconds — ABNORMAL HIGH (ref 11.4–15.2)

## 2016-04-20 ENCOUNTER — Encounter: Payer: Self-pay | Admitting: Orthopaedic Surgery

## 2016-04-20 ENCOUNTER — Ambulatory Visit (INDEPENDENT_AMBULATORY_CARE_PROVIDER_SITE_OTHER): Payer: Self-pay | Admitting: Orthopaedic Surgery

## 2016-04-20 ENCOUNTER — Ambulatory Visit (INDEPENDENT_AMBULATORY_CARE_PROVIDER_SITE_OTHER): Payer: Medicare Other

## 2016-04-20 VITALS — BP 133/76 | HR 88 | Temp 98.6°F

## 2016-04-20 DIAGNOSIS — M4854XA Collapsed vertebra, not elsewhere classified, thoracic region, initial encounter for fracture: Secondary | ICD-10-CM | POA: Diagnosis not present

## 2016-04-20 DIAGNOSIS — S22000A Wedge compression fracture of unspecified thoracic vertebra, initial encounter for closed fracture: Secondary | ICD-10-CM

## 2016-04-20 NOTE — Progress Notes (Signed)
CC:  I feel fine  She is resident at local rest home.  She gently fell to the floor with assistance on 04-12-16 with no apparent injury.  She is not wearing her CASH brace.  NV intact.  X-rays were done today, reported separately.  Encounter Diagnosis  Name Primary?  . Compression fracture of body of thoracic vertebra (HCC) Yes   I will see her as needed.  Forms completed for rest home.  Electronically Signed Darreld McleanWayne [name], MD 2/28/20183:03 PM

## 2016-04-21 ENCOUNTER — Telehealth: Payer: Self-pay | Admitting: Orthopaedic Surgery

## 2016-04-21 NOTE — Telephone Encounter (Signed)
No.  She refuses to use it.

## 2016-04-21 NOTE — Telephone Encounter (Signed)
Marcelino [facility]Michelle at [facility] requests [facility] order to discontinue brace. Please advise.

## 2016-04-22 NOTE — Telephone Encounter (Signed)
Called back to [facility]. Relayed to tech, CrescentMichelle.

## 2016-04-27 ENCOUNTER — Emergency Department (HOSPITAL_COMMUNITY): Payer: Medicare Other

## 2016-04-27 ENCOUNTER — Emergency Department (HOSPITAL_COMMUNITY)
Admission: EM | Admit: 2016-04-27 | Discharge: 2016-04-28 | Disposition: A | Discharge status: Home / Self Care | Payer: Medicare Other | Attending: Emergency Medicine | Admitting: Emergency Medicine

## 2016-04-27 ENCOUNTER — Encounter (HOSPITAL_COMMUNITY): Payer: Self-pay | Admitting: Emergency Medicine

## 2016-04-27 DIAGNOSIS — M546 Pain in thoracic spine: Secondary | ICD-10-CM | POA: Insufficient documentation

## 2016-04-27 DIAGNOSIS — S7001XA Contusion of right hip, initial encounter: Secondary | ICD-10-CM | POA: Diagnosis not present

## 2016-04-27 DIAGNOSIS — Y92002 Bathroom of unspecified non-institutional (private) residence single-family (private) house as the place of occurrence of the external cause: Secondary | ICD-10-CM | POA: Insufficient documentation

## 2016-04-27 DIAGNOSIS — I10 Essential (primary) hypertension: Secondary | ICD-10-CM | POA: Diagnosis not present

## 2016-04-27 DIAGNOSIS — Y999 Unspecified external cause status: Secondary | ICD-10-CM | POA: Insufficient documentation

## 2016-04-27 DIAGNOSIS — S79911A Unspecified injury of right hip, initial encounter: Secondary | ICD-10-CM | POA: Diagnosis present

## 2016-04-27 DIAGNOSIS — W19XXXA Unspecified fall, initial encounter: Secondary | ICD-10-CM

## 2016-04-27 DIAGNOSIS — N390 Urinary tract infection, site not specified: Secondary | ICD-10-CM | POA: Diagnosis not present

## 2016-04-27 DIAGNOSIS — Z7901 Long term (current) use of anticoagulants: Secondary | ICD-10-CM | POA: Insufficient documentation

## 2016-04-27 DIAGNOSIS — Y9389 Activity, other specified: Secondary | ICD-10-CM | POA: Diagnosis not present

## 2016-04-27 DIAGNOSIS — Z79899 Other long term (current) drug therapy: Secondary | ICD-10-CM | POA: Insufficient documentation

## 2016-04-27 DIAGNOSIS — E039 Hypothyroidism, unspecified: Secondary | ICD-10-CM | POA: Diagnosis not present

## 2016-04-27 DIAGNOSIS — W1800XA Striking against unspecified object with subsequent fall, initial encounter: Secondary | ICD-10-CM | POA: Insufficient documentation

## 2016-04-27 LAB — CBC
HCT: 37.8 % (ref 36.0–46.0)
Hemoglobin: 12.4 g/dL (ref 12.0–15.0)
MCH: 32 pg (ref 26.0–34.0)
MCHC: 32.8 g/dL (ref 30.0–36.0)
MCV: 97.7 fL (ref 78.0–100.0)
PLATELETS: 248 10*3/uL (ref 150–400)
RBC: 3.87 MIL/uL (ref 3.87–5.11)
RDW: 13.6 % (ref 11.5–15.5)
WBC: 14.6 10*3/uL — AB (ref 4.0–10.5)

## 2016-04-27 LAB — URINALYSIS, ROUTINE W REFLEX MICROSCOPIC
Bacteria, UA: NONE SEEN
Bilirubin Urine: NEGATIVE
GLUCOSE, UA: NEGATIVE mg/dL
Ketones, ur: 5 mg/dL — AB
Nitrite: NEGATIVE
PH: 6 (ref 5.0–8.0)
PROTEIN: 30 mg/dL — AB
Specific Gravity, Urine: 1.014 (ref 1.005–1.030)

## 2016-04-27 LAB — BASIC METABOLIC PANEL
Anion gap: 8 (ref 5–15)
BUN: 9 mg/dL (ref 6–20)
CALCIUM: 8.9 mg/dL (ref 8.9–10.3)
CO2: 26 mmol/L (ref 22–32)
Chloride: 101 mmol/L (ref 101–111)
Creatinine, Ser: 0.42 mg/dL — ABNORMAL LOW (ref 0.44–1.00)
GFR calc Af Amer: >60 mL/min (ref >60)
Glucose, Bld: 126 mg/dL — ABNORMAL HIGH (ref 65–99)
POTASSIUM: 3.4 mmol/L — AB (ref 3.5–5.1)
SODIUM: 135 mmol/L (ref 135–145)

## 2016-04-27 MED ORDER — CEPHALEXIN 500 MG PO CAPS: 500.0000 mg | ORAL_CAPSULE | Freq: Three times a day (TID) | ORAL | 0 refills | Status: DC

## 2016-04-27 MED ORDER — MORPHINE SULFATE (PF) 4 MG/ML IV SOLN
4.0000 mg | Freq: Once | INTRAVENOUS | Status: AC
  Administered 2016-04-27: 4 mg via INTRAVENOUS
  Filled 2016-04-27: qty 1

## 2016-04-27 MED ORDER — ONDANSETRON HCL 4 MG/2ML IJ SOLN
4.0000 mg | Freq: Once | INTRAMUSCULAR | Status: AC
  Administered 2016-04-27: 4 mg via INTRAVENOUS
  Filled 2016-04-27: qty 2

## 2016-04-27 NOTE — ED Provider Notes (Signed)
[facility]-EMERGENCY DEPT Provider Note   CSN: [id number] Arrival date & time: 04/27/16  1723     History   Chief Complaint Chief Complaint  Patient presents with  . Fall    HPI Tamara [name] is a 83 y.o. female.  HPI Patient presents to the emergency department after falling while in the restroom today.  She struck her right buttock on the toilet paper holder and sustained an abrasion to the right gluteal region.  She denies weakness of her arms or legs or she denies head injury.  She denies neck pain.  She did have nausea and one episode of vomiting during triage.  She states she does this when she has pain.  Patient reports no loss of consciousness during my examination.  Family reports that she is not supposed to get up without assistance and yet she continues to.  Family is working with staff.  Patient with a history of dementia.   Past Medical History:  Diagnosis Date  . Acute colitis 09/2011   C. difficile negative  . Arthritis   . Atrial fibrillation (HCC)    Onset well before 2004; Negative stress nuclear study in 01/2003  . Back pain   . Chronic anticoagulation   . Dementia   . Depression   . Fractures   . GERD (gastroesophageal reflux disease)   . Hiatal hernia    by CT in 2013; also noted was an adrenal adenoma, duodenal lipoma right ovarian cyst, post hysterectomy  . History of spinal fracture   . Hypercalcemia   . Hyperlipidemia   . Hypertension   . Osteoporosis   . Pericardial effusion   . Pneumonia 09/2011   09/2011  . Syncope   . Thyroid disease    hypothyroid    Patient Active Problem List   Diagnosis Date Noted  . UTI (urinary tract infection) 01/11/2016  . Dementia 01/11/2016  . Hypothyroidism 01/11/2016  . GERD (gastroesophageal reflux disease) 01/11/2016  . Gram-negative bacteremia 05/28/2014  . UTI (lower urinary tract infection) 05/28/2014  . Sepsis (HCC) 05/28/2014  . Hypokalemia 02/25/2013  . Hematuria 02/25/2013  . Fever 02/24/2013    . Acute bronchitis 02/24/2013  . Back pain 02/24/2013  . Left wrist fracture 02/24/2013  . Influenza due to identified novel influenza A virus with other respiratory manifestations 02/24/2013  . Degenerative joint disease 08/17/2012  . Hypercalcemia 02/29/2012  . Acute pericardial effusion 02/25/2012  . Hypertension   . Chronic anticoagulation   . Hyperlipidemia   . Atrial fibrillation (HCC) 09/28/2011  . Acute encephalopathy 09/28/2011    Past Surgical History:  Procedure Laterality Date  . ABDOMINAL HYSTERECTOMY     No oophorectomy  . APPENDECTOMY    . COLONOSCOPY  2005   Reportedly normal screening study    OB History    Gravida Para Term Preterm AB Living   2 2 2          SAB TAB Ectopic Multiple Live Births                   Home Medications    Prior to Admission medications   Medication Sig Start Date End Date Taking? Authorizing Provider  acetaminophen (TYLENOL) 500 MG tablet Take 1,000 mg by mouth every 8 (eight) hours as needed for mild pain or moderate pain.    Yes Historical Provider, MD  diltiazem (CARDIZEM) 120 MG tablet Take 120 mg by mouth daily.   Yes Historical Provider, MD  docusate sodium 100 MG CAPS  Take 100 mg by mouth 2 (two) times daily. 02/26/13  Yes Marianne L York, PA-C  ENSURE (ENSURE) Take 237 mLs by mouth daily.   Yes Historical Provider, MD  escitalopram (LEXAPRO) 10 MG tablet Take 10 mg by mouth daily.   Yes Historical Provider, MD  hydrocortisone (ANUSOL-HC) 2.5 % rectal cream Place 1 application rectally 3 (three) times daily as needed for hemorrhoids or itching.   Yes Historical Provider, MD  levothyroxine (SYNTHROID, LEVOTHROID) 100 MCG tablet Take 100 mcg by mouth daily before breakfast.   Yes Historical Provider, MD  omeprazole (PRILOSEC) 40 MG capsule Take 1 capsule (40 mg total) by mouth daily. For heartburn 05/14/12  Yes Sharon SellerJessica K Eubanks, NP  ondansetron (ZOFRAN) 4 MG tablet Take 4 mg by mouth every 8 (eight) hours as needed for  nausea or vomiting.   Yes Historical Provider, MD  Polyethyl Glycol-Propyl Glycol (SYSTANE) 0.4-0.3 % SOLN Place 1 drop into both eyes 2 (two) times daily.   Yes Historical Provider, MD  polyethylene glycol (MIRALAX / GLYCOLAX) packet Take 17 g by mouth daily as needed for mild constipation.   Yes Historical Provider, MD  warfarin (COUMADIN) 3 MG tablet Take 3 mg by mouth every evening.   Yes Historical Provider, MD    Family History Family History  Problem Relation Age of Onset  . Cancer Mother     kidney  . Heart attack Father   . Hypertension Father     Social History Social History  Substance Use Topics  . Smoking status: Never Smoker  . Smokeless tobacco: Never Used  . Alcohol use No     Allergies   Patient has no known allergies.   Review of Systems Review of Systems  All other systems reviewed and are negative.    Physical Exam Updated Vital Signs BP 138/59   Pulse 120   Temp 98 F (36.7 C)   Resp 20   Ht 5\' 9"  (1.753 m)   Wt 150 lb (68 kg)   SpO2 93%   BMI 22.15 kg/m   Physical Exam  Constitutional: She is oriented to person, place, and time. She appears well-developed and well-nourished. No distress.  HENT:  Head: Normocephalic and atraumatic.  No bruising or swelling.  No signs of trauma  Eyes: EOM are normal.  Neck: Normal range of motion. Neck supple.  No C-spine tenderness.  Cardiovascular: Normal rate, regular rhythm and normal heart sounds.   Pulmonary/Chest: Effort normal and breath sounds normal.  Abdominal: Soft. She exhibits no distension. There is no tenderness.  Musculoskeletal:  Full range of motion bilateral hips, knees, ankles.  Small abrasion and bruising to the right gluteal region.  Mild thoracic and parathoracic tenderness and lumbar and paralumbar tenderness.  Full range of motion of bilateral shoulders, elbows, wrists  Neurological: She is alert and oriented to person, place, and time.  Skin: Skin is warm and dry.   Psychiatric: She has a normal mood and affect. Judgment normal.  Nursing note and vitals reviewed.    ED Treatments / Results  Labs (all labs ordered are listed, but only abnormal results are displayed) Labs Reviewed  CBC - Abnormal; Notable for the following:       Result Value   WBC 14.6 (*)    All other components within normal limits  BASIC METABOLIC PANEL - Abnormal; Notable for the following:    Potassium 3.4 (*)    Glucose, Bld 126 (*)    Creatinine, Ser 0.42 (*)  All other components within normal limits  URINALYSIS, ROUTINE W REFLEX MICROSCOPIC    EKG  EKG Interpretation None       Radiology Dg Thoracic Spine 2 View  Result Date: 04/27/2016 CLINICAL DATA:  Fall. EXAM: THORACIC SPINE 2 VIEWS COMPARISON:  Lateral film from 04/20/2016. Two-view exam from 02/03/2015. FINDINGS: Bones are markedly demineralized. Compression deformity at T12 is stable. Loss of vertebral body height at T9 and T10 also appears stable. Endplate spurring is noted in the lower thoracic levels. Visualize lung show underlying chronic interstitial changes. Cardiopericardial silhouette is enlarged. IMPRESSION: Stable compression deformity at T9, T10, and T12. No evidence for an acute thoracic spine fracture. Cardiomegaly. Electronically Signed   By: Kennith Center M.D.   On: 04/27/2016 19:38   Dg Lumbar Spine Complete  Result Date: 04/27/2016 CLINICAL DATA:  Fall from standing position. EXAM: LUMBAR SPINE - COMPLETE 4+ VIEW COMPARISON:  07/21/2015 FINDINGS: Bones are markedly demineralized. Within this limitation, no acute lumbar spine fracture is evident. T12 compression fracture is stable. Loss of disc height with endplate degeneration noted at L1-2 and L2-3. Diffuse paravertebral spurring is evident. SI joints are unremarkable. IMPRESSION: Chronic degenerative changes without evidence for an acute lumbar spine fracture. Stable T12 compression deformity. Electronically Signed   By: Kennith Center M.D.    On: 04/27/2016 19:35   Dg Pelvis 1-2 Views  Result Date: 04/27/2016 CLINICAL DATA:  Fall from standing position.  Right buttock pain. EXAM: PELVIS - 1-2 VIEW COMPARISON:  CT of the abdomen pelvis.  01/01/2016 FINDINGS: Moderate osteopenia is present. Degenerative changes are again noted in the lower lumbar spine and SI joints. No acute fracture is evident. Vascular calcifications are seen. IMPRESSION: No acute fracture. Electronically Signed   By: Marin Roberts M.D.   On: 04/27/2016 19:33    Procedures Procedures (including critical care time)  Medications Ordered in ED Medications  ondansetron (ZOFRAN) injection 4 mg (4 mg Intravenous Given 04/27/16 1926)  morphine 4 MG/ML injection 4 mg (4 mg Intravenous Given 04/27/16 1926)     Initial Impression / Assessment and Plan / ED Course  I have reviewed the triage vital signs and the nursing notes.  Pertinent labs & imaging results that were available during my care of the patient were reviewed by me and considered in my medical decision making (see chart for details).     Patient is overall well-appearing.  No acute traumatic injury.  Labs without significant abnormality.  Still awaiting urine.  I suspect the patient will do well BLD be discharged home.  Will continue to follow up on the urine.  She'll need close primary care follow-up.  Family understands to return to the ER for new or worsening symptoms.  Head is atraumatic with no signs of trauma.  She has no headache at this time.  She has no neck pain or neck tenderness.   Final Clinical Impressions(s) / ED Diagnoses   Final diagnoses:  None    New Prescriptions New Prescriptions   No medications on file     Azalia Bilis, MD 04/27/16 2205

## 2016-04-27 NOTE — ED Triage Notes (Signed)
PT brought in by [facility] today for fall from standing position at [facility] and pt c/o right buttock pain and sustained an abraison to right buttocks. PT also c/o nausea and one episode of vomiting during triage. PT unsure if she had LOC.

## 2016-04-28 MED ORDER — ONDANSETRON 4 MG PO TBDP
ORAL_TABLET | ORAL | Status: AC
  Filled 2016-04-28: qty 1

## 2016-04-28 MED ORDER — ONDANSETRON 4 MG PO TBDP
4.0000 mg | ORAL_TABLET | Freq: Once | ORAL | Status: AC
  Administered 2016-04-28: 4 mg via ORAL

## 2016-04-28 NOTE — ED Notes (Signed)
Pt vomiting as she is moving to the wheelchair; Dr. Ranae PalmsYelverton informed and order for zofran 4mg  given and carried out

## 2017-08-20 ENCOUNTER — Emergency Department (HOSPITAL_COMMUNITY)
Admission: EM | Admit: 2017-08-20 | Discharge: 2017-08-20 | Disposition: A | Discharge status: Skilled Nursing Facility | Payer: Medicare Other | Attending: Emergency Medicine | Admitting: Emergency Medicine

## 2017-08-20 ENCOUNTER — Emergency Department (HOSPITAL_COMMUNITY): Payer: Medicare Other

## 2017-08-20 ENCOUNTER — Encounter (HOSPITAL_COMMUNITY): Payer: Self-pay | Admitting: Emergency Medicine

## 2017-08-20 DIAGNOSIS — Z79899 Other long term (current) drug therapy: Secondary | ICD-10-CM | POA: Diagnosis not present

## 2017-08-20 DIAGNOSIS — R05 Cough: Secondary | ICD-10-CM

## 2017-08-20 DIAGNOSIS — R918 Other nonspecific abnormal finding of lung field: Secondary | ICD-10-CM | POA: Insufficient documentation

## 2017-08-20 DIAGNOSIS — F0390 Unspecified dementia without behavioral disturbance: Secondary | ICD-10-CM | POA: Insufficient documentation

## 2017-08-20 DIAGNOSIS — E039 Hypothyroidism, unspecified: Secondary | ICD-10-CM | POA: Insufficient documentation

## 2017-08-20 DIAGNOSIS — R079 Chest pain, unspecified: Secondary | ICD-10-CM | POA: Diagnosis present

## 2017-08-20 DIAGNOSIS — R112 Nausea with vomiting, unspecified: Secondary | ICD-10-CM | POA: Insufficient documentation

## 2017-08-20 DIAGNOSIS — Z7982 Long term (current) use of aspirin: Secondary | ICD-10-CM | POA: Diagnosis not present

## 2017-08-20 DIAGNOSIS — R059 Cough, unspecified: Secondary | ICD-10-CM

## 2017-08-20 LAB — BASIC METABOLIC PANEL
ANION GAP: 11 (ref 5–15)
BUN: 14 mg/dL (ref 8–23)
CHLORIDE: 104 mmol/L (ref 98–111)
CO2: 25 mmol/L (ref 22–32)
CREATININE: 0.8 mg/dL (ref 0.44–1.00)
Calcium: 9.1 mg/dL (ref 8.9–10.3)
GFR calc Af Amer: >60 mL/min (ref >60)
GFR calc non Af Amer: >60 mL/min (ref >60)
Glucose, Bld: 96 mg/dL (ref 70–99)
Potassium: 3.8 mmol/L (ref 3.5–5.1)
Sodium: 140 mmol/L (ref 135–145)

## 2017-08-20 LAB — TROPONIN I

## 2017-08-20 LAB — CBC
HCT: 40.5 % (ref 36.0–46.0)
Hemoglobin: 12.8 g/dL (ref 12.0–15.0)
MCH: 30.3 pg (ref 26.0–34.0)
MCHC: 31.6 g/dL (ref 30.0–36.0)
MCV: 95.7 fL (ref 78.0–100.0)
Platelets: 341 K/uL (ref 150–400)
RBC: 4.23 MIL/uL (ref 3.87–5.11)
RDW: 13.5 % (ref 11.5–15.5)
WBC: 10 K/uL (ref 4.0–10.5)

## 2017-08-20 NOTE — Discharge Instructions (Signed)
Relative to the vomiting, start with a clear liquid diet then gradually advance to regular foods.  For the chest discomfort you can try taking Maalox in case an antacid will help.  There was an incidental finding of a possible right hilar lung mass.  The patient's family members have chosen to not evaluate this further at this time.  Return here, if needed, for problems.

## 2017-08-20 NOTE — ED Triage Notes (Signed)
Pt brought in from [facility] for evaluation of chest pain last night.  Pt denies pain now.  States the staff would not send her to the ED last night when she asked.  Staff told ems pt was vomiting all day yesterday.

## 2017-08-20 NOTE — ED Provider Notes (Signed)
[facility] EMERGENCY DEPARTMENT Provider Note   CSN: [id number] Arrival date & time: 08/20/17  [id number]     History   Chief Complaint Chief Complaint  Patient presents with  . Chest Pain    HPI Tamara [name] is a 84 y.o. female.  HPI   Patient presents for evaluation of vomiting yesterday, and chest pain today.  She is unable to give history.  History given by EMS who transferred her and confirmed with workers at her skilled facility.  Patient is unable to give any history, of why she is here.  When asked she states that she is comfortable and denies pain of any kind.  Level 5 caveat-dementia  Past Medical History:  Diagnosis Date  . Acute colitis 09/2011   C. difficile negative  . Arthritis   . Atrial fibrillation (HCC)    Onset well before 2004; Negative stress nuclear study in 01/2003  . Back pain   . Chronic anticoagulation   . Dementia   . Depression   . Fractures   . GERD (gastroesophageal reflux disease)   . Hiatal hernia    by CT in 2013; also noted was an adrenal adenoma, duodenal lipoma right ovarian cyst, post hysterectomy  . History of spinal fracture   . Hypercalcemia   . Hyperlipidemia   . Hypertension   . Osteoporosis   . Pericardial effusion   . Pneumonia 09/2011   09/2011  . Syncope   . Thyroid disease    hypothyroid    Patient Active Problem List   Diagnosis Date Noted  . UTI (urinary tract infection) 01/11/2016  . Dementia 01/11/2016  . Hypothyroidism 01/11/2016  . GERD (gastroesophageal reflux disease) 01/11/2016  . Gram-negative bacteremia 05/28/2014  . UTI (lower urinary tract infection) 05/28/2014  . Sepsis (HCC) 05/28/2014  . Hypokalemia 02/25/2013  . Hematuria 02/25/2013  . Fever 02/24/2013  . Acute bronchitis 02/24/2013  . Back pain 02/24/2013  . Left wrist fracture 02/24/2013  . Influenza due to identified novel influenza A virus with other respiratory manifestations 02/24/2013  . Degenerative joint disease 08/17/2012  .  Hypercalcemia 02/29/2012  . Acute pericardial effusion 02/25/2012  . Hypertension   . Chronic anticoagulation   . Hyperlipidemia   . Atrial fibrillation (HCC) 09/28/2011  . Acute encephalopathy 09/28/2011    Past Surgical History:  Procedure Laterality Date  . ABDOMINAL HYSTERECTOMY     No oophorectomy  . APPENDECTOMY    . COLONOSCOPY  2005   Reportedly normal screening study     OB History    Gravida  2   Para  2   Term  2   Preterm      AB      Living        SAB      TAB      Ectopic      Multiple      Live Births               Home Medications    Prior to Admission medications   Medication Sig Start Date End Date Taking? Authorizing Provider  acetaminophen (TYLENOL) 500 MG tablet Take 1,000 mg by mouth every 8 (eight) hours as needed for mild pain or moderate pain.    Yes [provider]  aspirin EC 81 MG tablet Take 81 mg by mouth daily. For pain   Yes [provider]  Calcium Carbonate-Vitamin D (CALCIUM 600+D) 600-400 MG-UNIT tablet Take 1 tablet by mouth daily.  Yes [provider]  diltiazem (CARDIZEM) 120 MG tablet Take 120 mg by mouth daily.   Yes [provider]  docusate sodium 100 MG CAPS Take 100 mg by mouth 2 (two) times daily. 02/26/13  Yes Dellinger, Tora Kindred, PA-C  ENSURE (ENSURE) Take 237 mLs by mouth daily.   Yes [provider]  escitalopram (LEXAPRO) 5 MG tablet Take 5 mg by mouth daily.  07/29/17  Yes [provider]  fluconazole (DIFLUCAN) 150 MG tablet Take 150 mg by mouth once.   Yes [provider]  levothyroxine (SYNTHROID, LEVOTHROID) 75 MCG tablet Take 75 mcg by mouth daily before breakfast.   Yes [provider]  loperamide (IMODIUM A-D) 2 MG tablet Take 2 mg by mouth 4 (four) times daily as needed for diarrhea or loose stools.   Yes [provider]  omeprazole (PRILOSEC) 40 MG capsule Take 1 capsule (40 mg total) by mouth daily. For heartburn  05/14/12  Yes Sharon Seller, NP  ondansetron (ZOFRAN) 4 MG tablet Take 4 mg by mouth every 8 (eight) hours as needed for nausea or vomiting.   Yes [provider]  Polyethyl Glycol-Propyl Glycol (SYSTANE) 0.4-0.3 % SOLN Place 1 drop into both eyes 2 (two) times daily.   Yes [provider]  traMADol (ULTRAM) 50 MG tablet Take by mouth every 6 (six) hours as needed for moderate pain.   Yes [provider]  WARFARIN SODIUM PO Take 4.5 mg by mouth daily.   Yes [provider]    Family History Family History  Problem Relation Age of Onset  . Cancer Mother        kidney  . Heart attack Father   . Hypertension Father     Social History Social History   Tobacco Use  . Smoking status: Never Smoker  . Smokeless tobacco: Never Used  Substance Use Topics  . Alcohol use: No  . Drug use: No     Allergies   Patient has no known allergies.   Review of Systems Review of Systems  Unable to perform ROS: Dementia     Physical Exam Updated Vital Signs BP (!) 152/83   Pulse 74   Resp 13   Ht 5\' 10"  (1.778 m)   Wt 68 kg (150 lb)   SpO2 98%   BMI 21.52 kg/m   Physical Exam  Constitutional: She appears well-developed. She does not appear ill.  Elderly, frail  HENT:  Head: Normocephalic and atraumatic.  Eyes: Pupils are equal, round, and reactive to light. Conjunctivae and EOM are normal.  Neck: Normal range of motion and phonation normal. Neck supple.  Cardiovascular: Normal rate and regular rhythm.  Pulmonary/Chest: Effort normal and breath sounds normal. No respiratory distress. She exhibits no tenderness.  Abdominal: Soft. She exhibits no distension. There is no tenderness. There is no guarding.  Musculoskeletal: Normal range of motion. She exhibits no edema or deformity.  Neurological: She is alert. She exhibits normal muscle tone.  Skin: Skin is warm and dry.  Psychiatric: She has a normal mood and affect. Her behavior is normal.    Nursing note and vitals reviewed.    ED Treatments / Results  Labs (all labs ordered are listed, but only abnormal results are displayed) Labs Reviewed  BASIC METABOLIC PANEL  CBC  TROPONIN I    EKG EKG Interpretation  Date/Time:  Sunday August 20 2017 09:17:55 EDT Ventricular Rate:  63 PR Interval:    QRS Duration: 88 QT  Interval:  541 QTC Calculation: 554 R Axis:   -19 Text Interpretation:  Atrial fibrillation Borderline left axis deviation Anterior infarct, old Borderline T abnormalities, inferior leads Prolonged QT interval Since last tracing QT has shortened Confirmed by Mancel Bale [phone]) on 08/20/2017 9:26:22 AM   Radiology Dg Chest 2 View  Result Date: 08/20/2017 CLINICAL DATA:  Patient with chest pain. EXAM: CHEST - 2 VIEW COMPARISON:  Chest radiograph 01/21/2016 FINDINGS: Monitoring leads overlie the patient. Patient is rotated. Low lung volumes. Abnormal contour along the right aspect of the mediastinum. No consolidative pulmonary opacities. No pleural effusion or pneumothorax. Thoracic spine degenerative changes. IMPRESSION: Contour abnormality along the right aspect of the mediastinum likely secondary to patient rotation and low lung volumes. Recommend further evaluation with PA non rotated radiograph. Low lung volumes.  No acute cardiopulmonary process. Electronically Signed   By: Annia Belt M.D.   On: 08/20/2017 10:26   Dg Chest Port 1 View  Result Date: 08/20/2017 CLINICAL DATA:  Chest pain EXAM: PORTABLE CHEST 1 VIEW COMPARISON:  08/20/2017 at 9:52 a.m. FINDINGS: The patient is rotated to the right on today's radiograph, reducing diagnostic sensitivity and specificity. Continued right hilar prominence. Cannot exclude right hilar mass. Aortoiliac atherosclerotic vascular disease. Mild enlargement of the cardiopericardial silhouette, without edema. IMPRESSION: 1. Continued right hilar fullness. Mass not excluded. Consider chest CT. 2.  Aortic Atherosclerosis  (ICD10-I70.0). 3. Mild enlargement of the cardiopericardial silhouette, without edema. Electronically Signed   By: Gaylyn Rong M.D.   On: 08/20/2017 12:40    Procedures Procedures (including critical care time)  Medications Ordered in ED Medications - No data to display   Initial Impression / Assessment and Plan / ED Course  I have reviewed the triage vital signs and the nursing notes.  Pertinent labs & imaging results that were available during my care of the patient were reviewed by me and considered in my medical decision making (see chart for details).  Clinical Course as of Aug 20 1340  Sun Aug 20, 2017  1119 Normal  Troponin I [EW]  1119 Normal  CBC [EW]  1119 Normal  Basic metabolic panel [EW]  1119 Suboptimal images require repeat PA film.  DG Chest 2 View [EW]  1329 Possible right hilar mass, abnormal  DG Chest Port 1 View [EW]  1329 I was able to reach the patient's primary emergency contact, Tamara [name], her daughter-in-law.  We discussed the findings of possible right lung cancer.  Ms. Fiero and the patient's son, prefer not to get advanced imaging at this time.  They understand that they can get it later.   [EW]    Clinical Course User Index [EW] Mancel Bale, MD     Patient Vitals for the past 24 hrs:  BP Temp src Pulse Resp SpO2 Height Weight  08/20/17 1230 (!) 152/83 - 74 13 98 % - -  08/20/17 1130 (!) 143/78 - 63 13 95 % - -  08/20/17 1100 129/88 - 74 (!) 21 93 % - -  08/20/17 1030 (!) 143/58 - 71 18 95 % - -  08/20/17 1000 (!) 132/105 - 66 18 97 % - -  08/20/17 0915 111/66 Oral 88 18 97 % - -  08/20/17 0914 - - - - - 5\' 10"  (1.778 m) 68 kg (150 lb)    1:42 PM Reevaluation with update and discussion. After initial assessment and treatment, an updated evaluation reveals she remains comfortable and has no further complaints. Mancel Bale  Medical Decision Making: Specific nausea vomiting and chest discomfort.  Doubt ACS, PE or  pneumonia.  Doubt metabolic instability or impending vascular collapse.  The dental finding of abnormal chest x-ray indicating possible hilar mass.  Family has elected to not evaluate this further at this time.  CRITICAL CARE-no Performed by: Mancel Bale   Nursing Notes Reviewed/ Care Coordinated Applicable Imaging Reviewed Interpretation of Laboratory Data incorporated into ED treatment  The patient appears reasonably screened and/or stabilized for discharge and I doubt any other medical condition or other [facility] requiring further screening, evaluation, or treatment in the ED at this time prior to discharge.  Plan: Home Medications-continue current medications; Home Treatments-rest, fluids; return here if the recommended treatment, does not improve the symptoms; Recommended follow up-PCP as needed    Final Clinical Impressions(s) / ED Diagnoses   Final diagnoses:  Nonspecific chest pain  Nausea and vomiting, intractability of vomiting not specified, unspecified vomiting type  Lung mass    ED Discharge Orders    None       Mancel Bale, MD 08/20/17 1556

## 2017-08-20 NOTE — ED Notes (Signed)
Pt unable to sign due to mental status. Pt is with EMS and verbalized understanding.

## 2017-08-20 NOTE — ED Notes (Signed)
Pt has been adequately discharged. Awaiting EMS arrival

## 2017-12-27 IMAGING — DX DG THORACIC SPINE 2V
3 series · 3 of 3 positions shown · non-contrast
Comparison: Thoracic spine radiographs performed 02/03/2015

CLINICAL DATA: Status post fall, with upper back pain. Initial
encounter.

EXAM:
THORACIC SPINE 2 VIEWS

[t-spine ap]
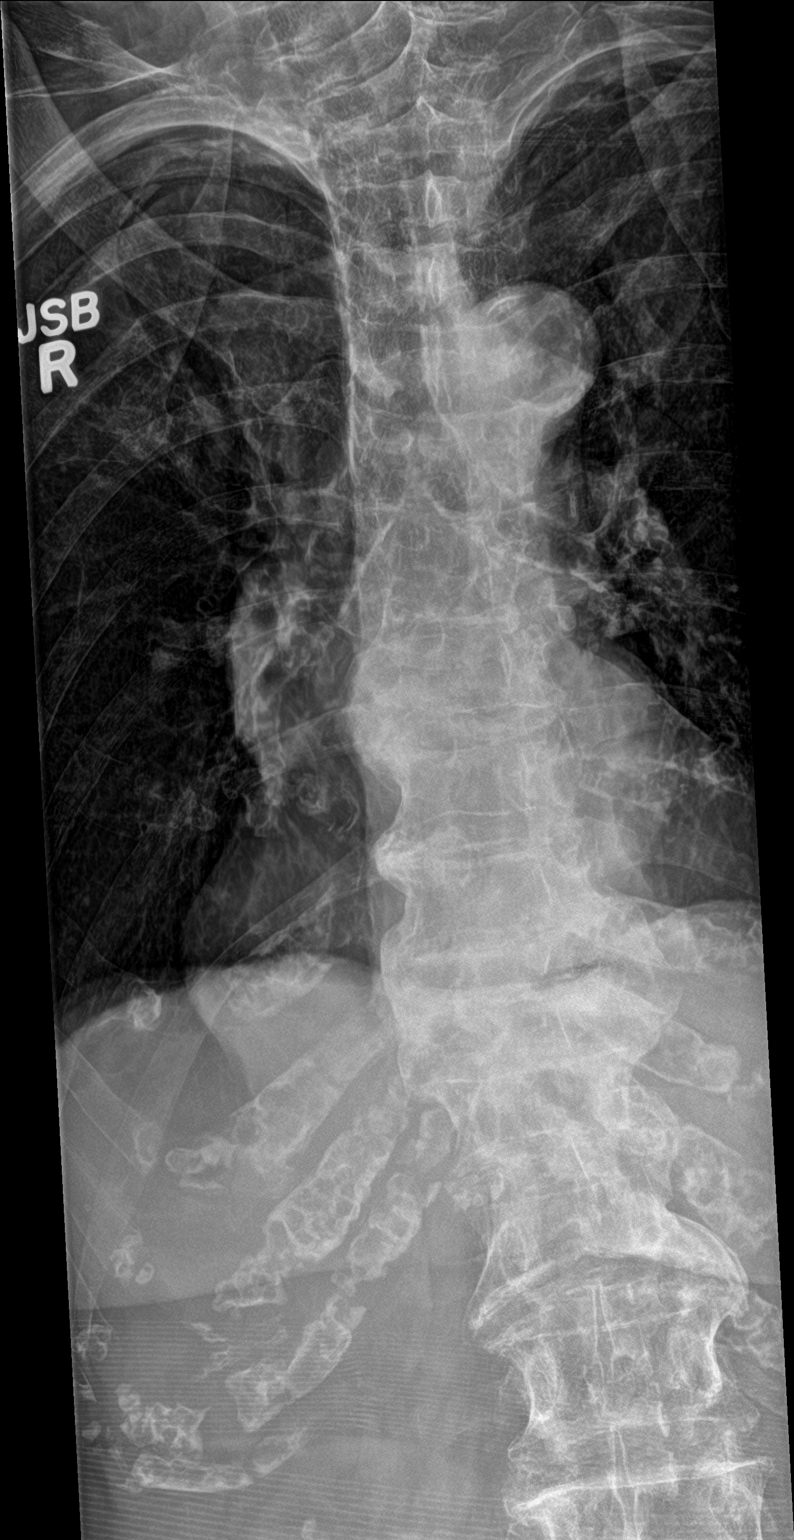

[t-spine lat]
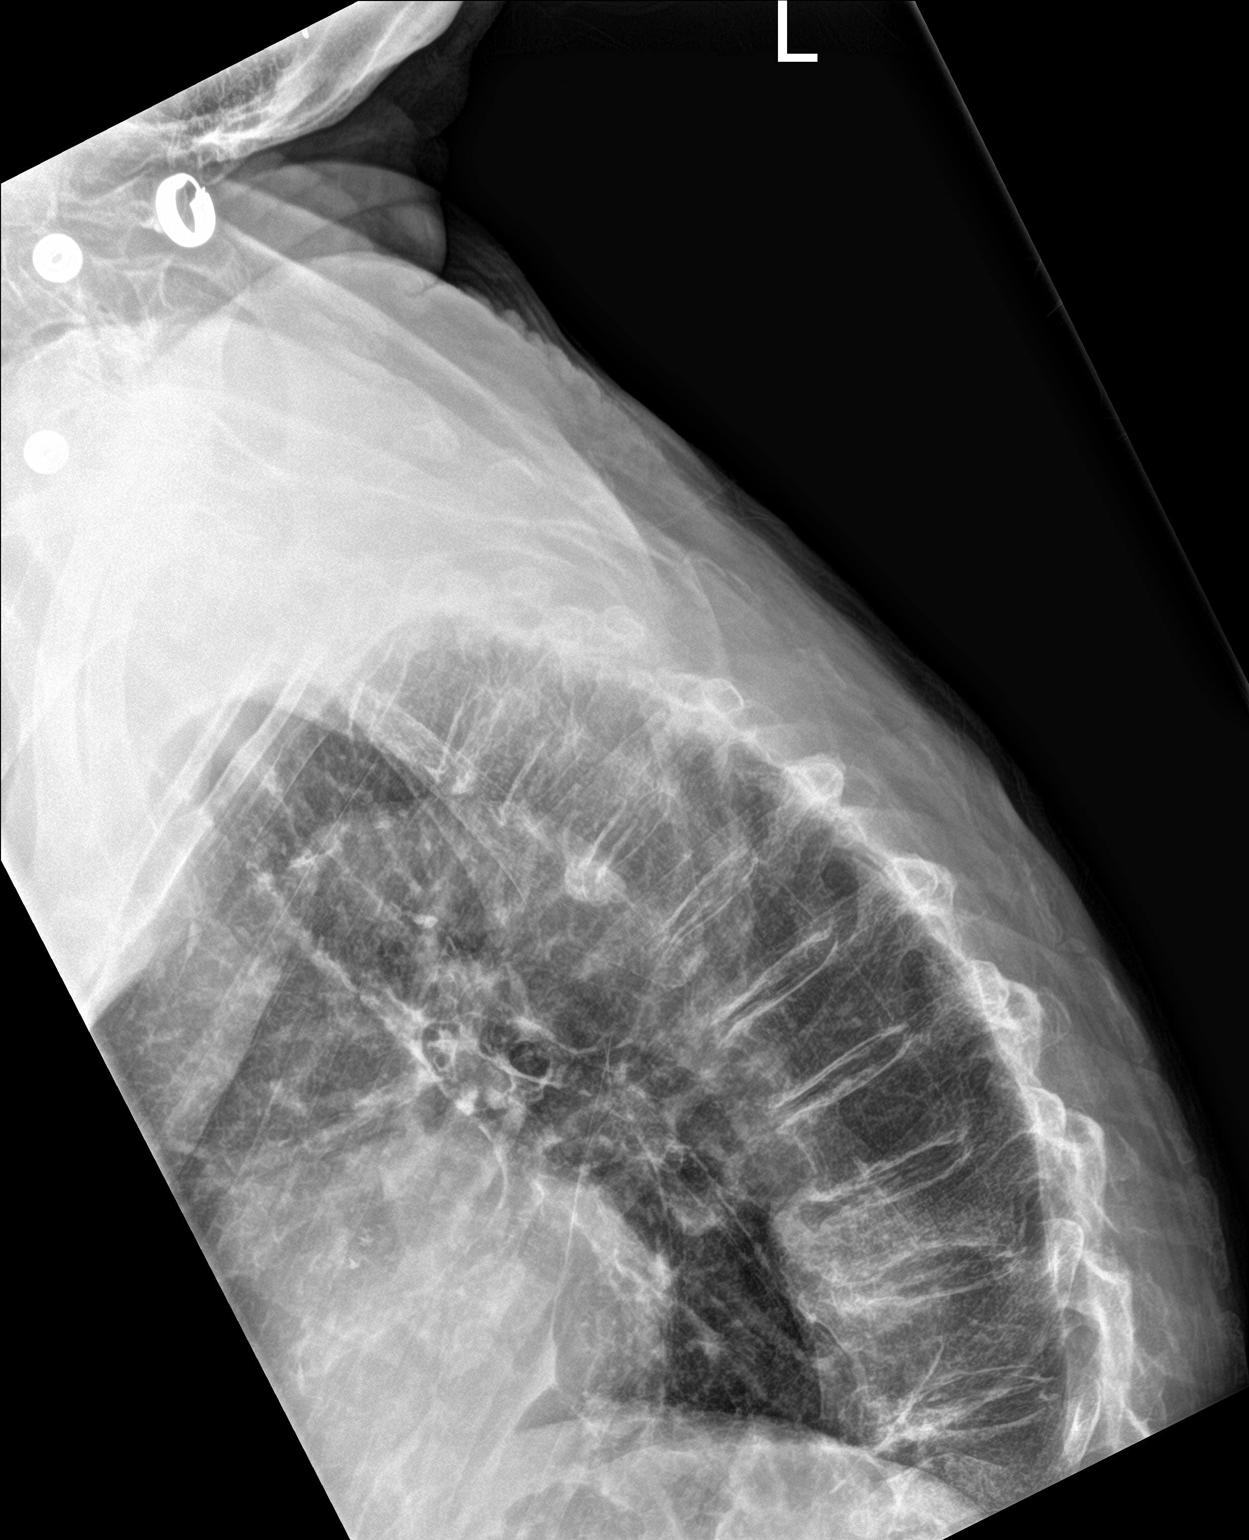

[t-spine swimmers]
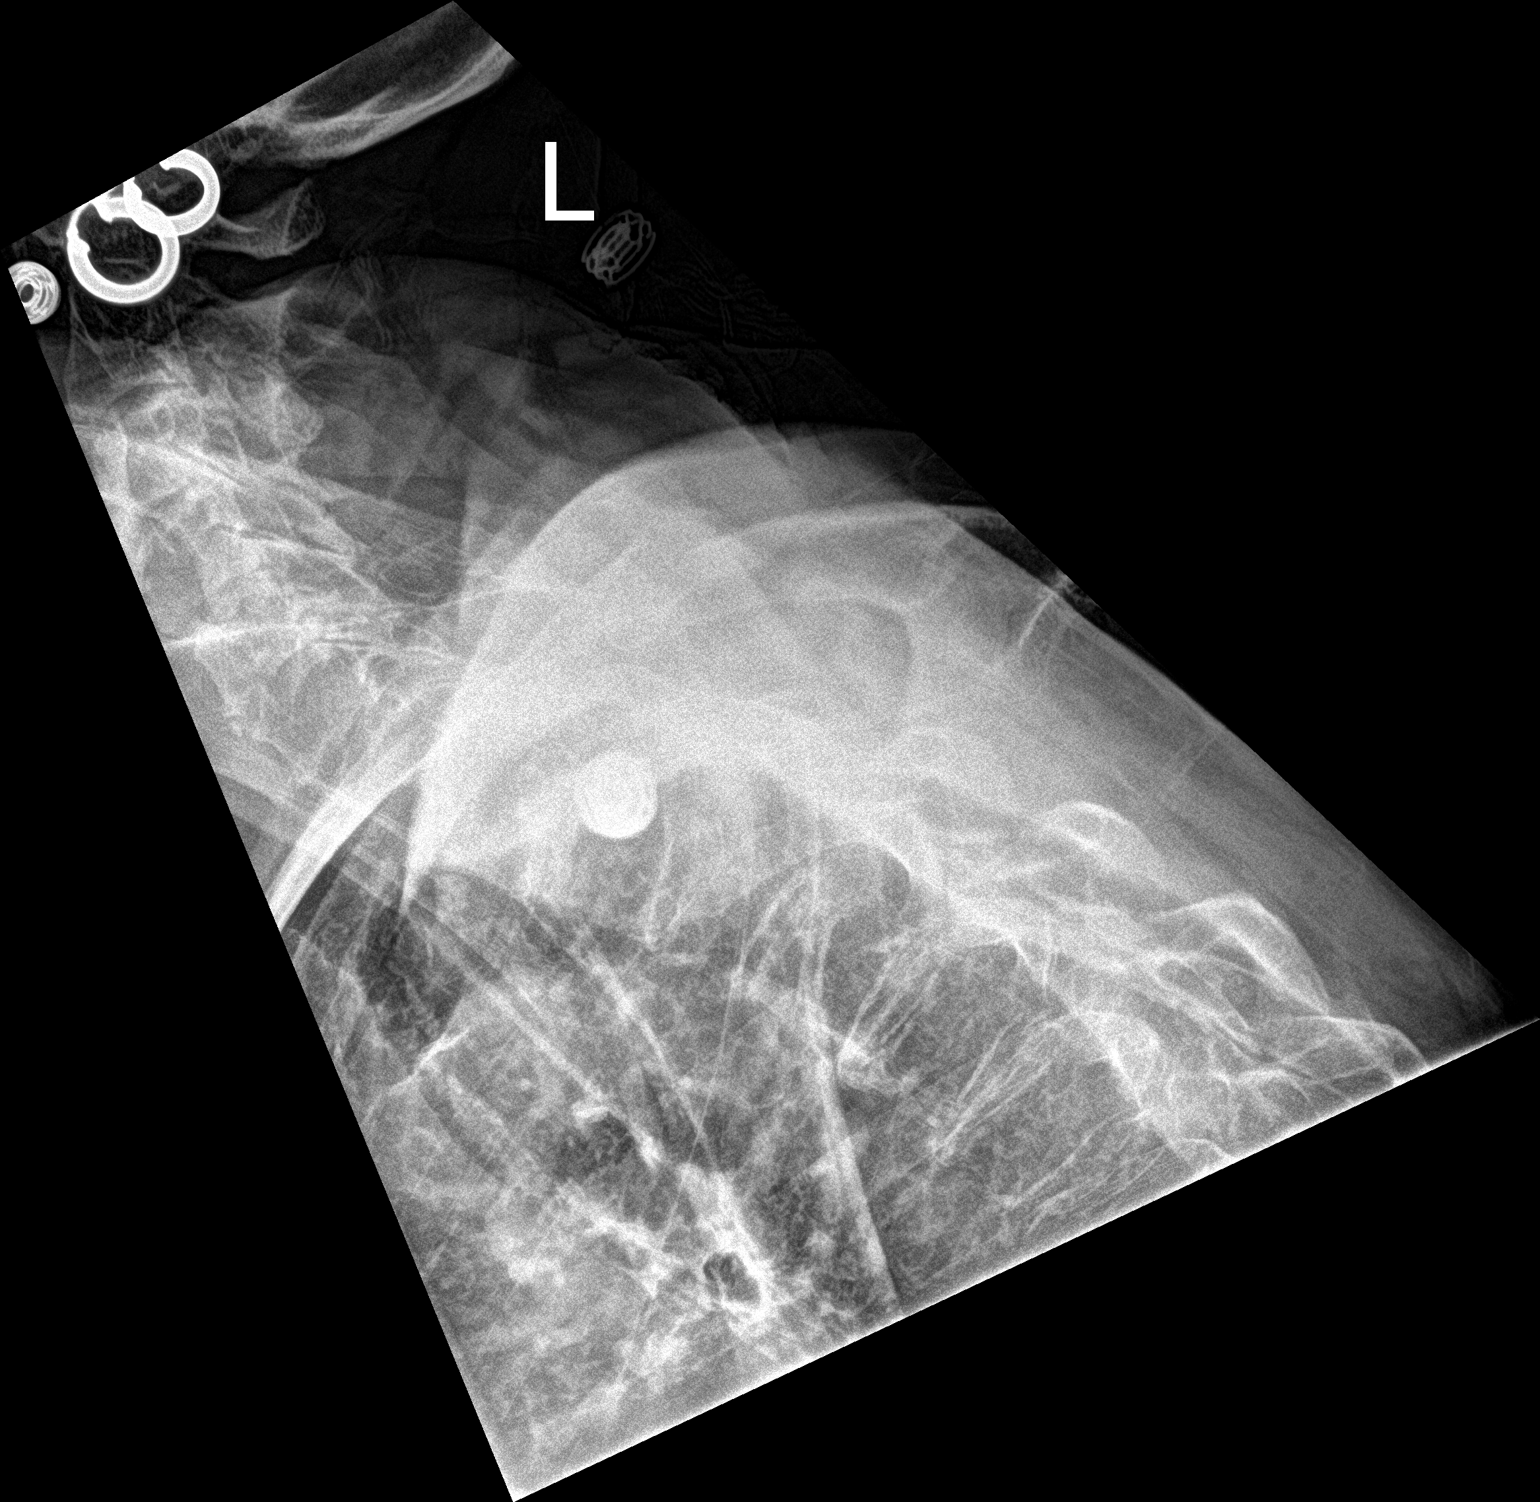

[3 of 3 positions shown; findings below may reference images not displayed]

FINDINGS: There is a mild chronic compression deformity at the lower thoracic
spine, stable in appearance. There is no evidence of acute fracture
or subluxation along the thoracic spine. Scattered lateral
osteophytes are noted along the thoracic spine.
IMPRESSION: No evidence of acute fracture or subluxation along the thoracic
spine. Mild chronic compression deformity at the lower thoracic
spine is stable in appearance.

## 2017-12-28 ENCOUNTER — Emergency Department (HOSPITAL_COMMUNITY)
Admission: EM | Admit: 2017-12-28 | Discharge: 2017-12-28 | Disposition: A | Discharge status: Home / Self Care | Payer: Medicare Other | Attending: Emergency Medicine | Admitting: Emergency Medicine

## 2017-12-28 ENCOUNTER — Encounter (HOSPITAL_COMMUNITY): Payer: Self-pay

## 2017-12-28 ENCOUNTER — Other Ambulatory Visit: Payer: Self-pay

## 2017-12-28 DIAGNOSIS — N938 Other specified abnormal uterine and vaginal bleeding: Secondary | ICD-10-CM | POA: Diagnosis present

## 2017-12-28 DIAGNOSIS — I4891 Unspecified atrial fibrillation: Secondary | ICD-10-CM | POA: Insufficient documentation

## 2017-12-28 DIAGNOSIS — Z7901 Long term (current) use of anticoagulants: Secondary | ICD-10-CM | POA: Diagnosis not present

## 2017-12-28 DIAGNOSIS — Z79899 Other long term (current) drug therapy: Secondary | ICD-10-CM | POA: Diagnosis not present

## 2017-12-28 DIAGNOSIS — Z7982 Long term (current) use of aspirin: Secondary | ICD-10-CM | POA: Diagnosis not present

## 2017-12-28 DIAGNOSIS — I10 Essential (primary) hypertension: Secondary | ICD-10-CM | POA: Insufficient documentation

## 2017-12-28 DIAGNOSIS — E039 Hypothyroidism, unspecified: Secondary | ICD-10-CM | POA: Diagnosis not present

## 2017-12-28 DIAGNOSIS — N3001 Acute cystitis with hematuria: Secondary | ICD-10-CM | POA: Insufficient documentation

## 2017-12-28 DIAGNOSIS — F0390 Unspecified dementia without behavioral disturbance: Secondary | ICD-10-CM | POA: Insufficient documentation

## 2017-12-28 LAB — BASIC METABOLIC PANEL
ANION GAP: 11 (ref 5–15)
BUN: 16 mg/dL (ref 8–23)
CALCIUM: 9.4 mg/dL (ref 8.9–10.3)
CO2: 24 mmol/L (ref 22–32)
Chloride: 105 mmol/L (ref 98–111)
Creatinine, Ser: 0.97 mg/dL (ref 0.44–1.00)
GFR calc Af Amer: 58 mL/min — ABNORMAL LOW (ref >60)
GFR, EST NON AFRICAN AMERICAN: 50 mL/min — AB (ref >60)
Glucose, Bld: 115 mg/dL — ABNORMAL HIGH (ref 70–99)
POTASSIUM: 3.2 mmol/L — AB (ref 3.5–5.1)
SODIUM: 140 mmol/L (ref 135–145)

## 2017-12-28 LAB — CBC WITH DIFFERENTIAL/PLATELET
Abs Immature Granulocytes: 0.02 10*3/uL (ref 0.00–0.07)
BASOS PCT: 1 %
Basophils Absolute: 0 10*3/uL (ref 0.0–0.1)
EOS ABS: 0.1 10*3/uL (ref 0.0–0.5)
Eosinophils Relative: 1 %
HCT: 39.9 % (ref 36.0–46.0)
Hemoglobin: 12.5 g/dL (ref 12.0–15.0)
IMMATURE GRANULOCYTES: 0 %
Lymphocytes Relative: 18 %
Lymphs Abs: 1.2 10*3/uL (ref 0.7–4.0)
MCH: 30.7 pg (ref 26.0–34.0)
MCHC: 31.3 g/dL (ref 30.0–36.0)
MCV: 98 fL (ref 80.0–100.0)
MONO ABS: 0.7 10*3/uL (ref 0.1–1.0)
Monocytes Relative: 10 %
NEUTROS ABS: 4.6 10*3/uL (ref 1.7–7.7)
NEUTROS PCT: 70 %
PLATELETS: 255 10*3/uL (ref 150–400)
RBC: 4.07 MIL/uL (ref 3.87–5.11)
RDW: 14.1 % (ref 11.5–15.5)
WBC: 6.5 10*3/uL (ref 4.0–10.5)
nRBC: 0 % (ref 0.0–0.2)

## 2017-12-28 LAB — PROTIME-INR
INR: 2.86
Prothrombin Time: 29.6 seconds — ABNORMAL HIGH (ref 11.4–15.2)

## 2017-12-28 LAB — URINALYSIS, ROUTINE W REFLEX MICROSCOPIC
Bilirubin Urine: NEGATIVE
GLUCOSE, UA: NEGATIVE mg/dL
Ketones, ur: NEGATIVE mg/dL
Nitrite: NEGATIVE
PH: 5 (ref 5.0–8.0)
PROTEIN: 100 mg/dL — AB
Specific Gravity, Urine: 1.028 (ref 1.005–1.030)
WBC, UA: >50 WBC/hpf — ABNORMAL HIGH (ref 0–5)

## 2017-12-28 LAB — POC OCCULT BLOOD, ED: FECAL OCCULT BLD: POSITIVE — AB

## 2017-12-28 MED ORDER — CEPHALEXIN 500 MG PO CAPS
500.0000 mg | ORAL_CAPSULE | Freq: Once | ORAL | Status: AC
  Administered 2017-12-28: 500 mg via ORAL
  Filled 2017-12-28: qty 1

## 2017-12-28 MED ORDER — CEPHALEXIN 500 MG PO CAPS: 500.0000 mg | ORAL_CAPSULE | Freq: Four times a day (QID) | ORAL | 0 refills | Status: DC

## 2017-12-28 NOTE — Discharge Instructions (Addendum)
Hold coumadin for 2 days

## 2017-12-28 NOTE — ED Triage Notes (Signed)
Pt is a resident of [facility], arrives via [facility] with possible vaginal bleed or hematuria.  Staff at the facility report pt with blood in her depends but also blood with urination.  Pt denies pain or other complaints.  Pt is on Coumadin

## 2017-12-28 NOTE — ED Provider Notes (Signed)
[facility] EMERGENCY DEPARTMENT Provider Note   CSN: [id number] Arrival date & time: 12/28/17  1920     History   Chief Complaint Chief Complaint  Patient presents with  . Vaginal Bleeding    HPI Tamara [name] is a 84 y.o. female.  Pt presents to the ED today with possible vaginal bleeding.  She is a resident of [facility] and staff at the facility saw blood in her depends.  They are not sure of the source.  Pt did not realize she was passing the blood.  She is on coumadin for chronic afib.  She denies any pain.     Past Medical History:  Diagnosis Date  . Acute colitis 09/2011   C. difficile negative  . Arthritis   . Atrial fibrillation (HCC)    Onset well before 2004; Negative stress nuclear study in 01/2003  . Back pain   . Chronic anticoagulation   . Dementia (HCC)   . Depression   . Fractures   . GERD (gastroesophageal reflux disease)   . Hiatal hernia    by CT in 2013; also noted was an adrenal adenoma, duodenal lipoma right ovarian cyst, post hysterectomy  . History of spinal fracture   . Hypercalcemia   . Hyperlipidemia   . Hypertension   . Osteoporosis   . Pericardial effusion   . Pneumonia 09/2011   09/2011  . Syncope   . Thyroid disease    hypothyroid    Patient Active Problem List   Diagnosis Date Noted  . UTI (urinary tract infection) 01/11/2016  . Dementia (HCC) 01/11/2016  . Hypothyroidism 01/11/2016  . GERD (gastroesophageal reflux disease) 01/11/2016  . Gram-negative bacteremia 05/28/2014  . UTI (lower urinary tract infection) 05/28/2014  . Sepsis (HCC) 05/28/2014  . Hypokalemia 02/25/2013  . Hematuria 02/25/2013  . Fever 02/24/2013  . Acute bronchitis 02/24/2013  . Back pain 02/24/2013  . Left wrist fracture 02/24/2013  . Influenza due to identified novel influenza A virus with other respiratory manifestations 02/24/2013  . Degenerative joint disease 08/17/2012  . Hypercalcemia 02/29/2012  . Acute pericardial effusion  02/25/2012  . Hypertension   . Chronic anticoagulation   . Hyperlipidemia   . Atrial fibrillation (HCC) 09/28/2011  . Acute encephalopathy 09/28/2011    Past Surgical History:  Procedure Laterality Date  . ABDOMINAL HYSTERECTOMY     No oophorectomy  . APPENDECTOMY    . COLONOSCOPY  2005   Reportedly normal screening study     OB History    Gravida  2   Para  2   Term  2   Preterm      AB      Living        SAB      TAB      Ectopic      Multiple      Live Births               Home Medications    Prior to Admission medications   Medication Sig Start Date End Date Taking? Authorizing Provider  acetaminophen (TYLENOL) 500 MG tablet Take 1,000 mg by mouth every 8 (eight) hours as needed for mild pain or moderate pain.     [provider]  aspirin EC 81 MG tablet Take 81 mg by mouth daily. For pain    [provider]  Calcium Carbonate-Vitamin D (CALCIUM 600+D) 600-400 MG-UNIT tablet Take 1 tablet by mouth daily.    [provider]  cephALEXin (  KEFLEX) 500 MG capsule Take 1 capsule (500 mg total) by mouth 4 (four) times daily. 12/28/17   Jacalyn Lefevre, MD  diltiazem (CARDIZEM) 120 MG tablet Take 120 mg by mouth daily.    [provider]  docusate sodium 100 MG CAPS Take 100 mg by mouth 2 (two) times daily. 02/26/13   Dellinger, Tora Kindred, PA-C  ENSURE (ENSURE) Take 237 mLs by mouth daily.    [provider]  escitalopram (LEXAPRO) 5 MG tablet Take 5 mg by mouth daily.  07/29/17   [provider]  fluconazole (DIFLUCAN) 150 MG tablet Take 150 mg by mouth once.    [provider]  levothyroxine (SYNTHROID, LEVOTHROID) 75 MCG tablet Take 75 mcg by mouth daily before breakfast.    [provider]  loperamide (IMODIUM A-D) 2 MG tablet Take 2 mg by mouth 4 (four) times daily as needed for diarrhea or loose stools.    [provider]  omeprazole (PRILOSEC) 40 MG capsule Take 1 capsule (40  mg total) by mouth daily. For heartburn 05/14/12   Sharon Seller, NP  ondansetron (ZOFRAN) 4 MG tablet Take 4 mg by mouth every 8 (eight) hours as needed for nausea or vomiting.    [provider]  Polyethyl Glycol-Propyl Glycol (SYSTANE) 0.4-0.3 % SOLN Place 1 drop into both eyes 2 (two) times daily.    [provider]  traMADol (ULTRAM) 50 MG tablet Take by mouth every 6 (six) hours as needed for moderate pain.    [provider]  WARFARIN SODIUM PO Take 4.5 mg by mouth daily.    [provider]    Family History Family History  Problem Relation Age of Onset  . Cancer Mother        kidney  . Heart attack Father   . Hypertension Father     Social History Social History   Tobacco Use  . Smoking status: Never Smoker  . Smokeless tobacco: Never Used  Substance Use Topics  . Alcohol use: No  . Drug use: No     Allergies   Patient has no known allergies.   Review of Systems Review of Systems  Genitourinary:       Blood in diaper  All other systems reviewed and are negative.    Physical Exam Updated Vital Signs BP (!) 150/82 (BP Location: Left Arm)   Pulse 92   Temp 98.5 F (36.9 C) (Oral)   Resp 17   Ht 5\' 10"  (1.778 m)   Wt 61.2 kg   SpO2 99%   BMI 19.37 kg/m   Physical Exam  Constitutional: She appears well-developed and well-nourished.  HENT:  Head: Normocephalic and atraumatic.  Right Ear: External ear normal.  Left Ear: External ear normal.  Nose: Nose normal.  Mouth/Throat: Oropharynx is clear and moist.  Eyes: Pupils are equal, round, and reactive to light. Conjunctivae and EOM are normal.  Neck: Normal range of motion. Neck supple.  Cardiovascular: Normal rate, regular rhythm, normal heart sounds and intact distal pulses.  Pulmonary/Chest: Effort normal and breath sounds normal.  Abdominal: Soft. Bowel sounds are normal.  Genitourinary: Rectal exam shows guaiac positive stool.  Genitourinary Comments: Pt  has a small amt of blood in her diaper mixed with urine.  Stool is guaiac + likely from the blood coming from her urine.  Stool itself was not grossly bloody or black. No evidence of bleeding from vagina.  Musculoskeletal: Normal range of motion.  Neurological: She is alert.  Skin: Skin is warm. Capillary refill takes less than 2 seconds.  Psychiatric: She has a normal mood and affect. Her behavior is normal. Judgment and thought content normal.  Nursing note and vitals reviewed.    ED Treatments / Results  Labs (all labs ordered are listed, but only abnormal results are displayed) Labs Reviewed  BASIC METABOLIC PANEL - Abnormal; Notable for the following components:      Result Value   Potassium 3.2 (*)    Glucose, Bld 115 (*)    GFR calc non Af Amer 50 (*)    GFR calc Af Amer 58 (*)    All other components within normal limits  URINALYSIS, ROUTINE W REFLEX MICROSCOPIC - Abnormal; Notable for the following components:   Color, Urine AMBER (*)    APPearance CLOUDY (*)    Hgb urine dipstick LARGE (*)    Protein, ur 100 (*)    Leukocytes, UA MODERATE (*)    RBC / HPF >50 (*)    WBC, UA >50 (*)    Bacteria, UA MANY (*)    All other components within normal limits  PROTIME-INR - Abnormal; Notable for the following components:   Prothrombin Time 29.6 (*)    All other components within normal limits  POC OCCULT BLOOD, ED - Abnormal; Notable for the following components:   Fecal Occult Bld POSITIVE (*)    All other components within normal limits  URINE CULTURE  CBC WITH DIFFERENTIAL/PLATELET    EKG None  Radiology No results found.  Procedures Procedures (including critical care time)  Medications Ordered in ED Medications  cephALEXin (KEFLEX) capsule 500 mg (has no administration in time range)     Initial Impression / Assessment and Plan / ED Course  I have reviewed the triage vital signs and the nursing notes.  Pertinent labs & imaging results that were  available during my care of the patient were reviewed by me and considered in my medical decision making (see chart for details).   Blood is coming from urinary source.  The pt is on coumadin which is making the bleeding worse than it would be normally.  I will put pt on keflex and hold coumadin for 2 days.  Final Clinical Impressions(s) / ED Diagnoses   Final diagnoses:  Acute cystitis with hematuria  Anticoagulated on Coumadin    ED Discharge Orders         Ordered    cephALEXin (KEFLEX) 500 MG capsule  4 times daily     12/28/17 2136           Jacalyn Lefevre, MD 12/28/17 2136

## 2017-12-31 LAB — URINE CULTURE

## 2018-01-01 ENCOUNTER — Telehealth: Payer: Self-pay | Admitting: Emergency Medicine

## 2018-01-01 NOTE — Telephone Encounter (Signed)
Post ED Visit - Positive Culture Follow-up  Culture report reviewed by antimicrobial stewardship pharmacist:  []  Enzo Bi, Pharm.D. []  Celedonio Miyamoto, Pharm.D., BCPS AQ-ID []  Garvin Fila, Pharm.D., BCPS []  Georgina Pillion, [address].D., BCPS []  [place], [address].D., BCPS, AAHIVP []  Estella Husk, Pharm.D., BCPS, AAHIVP []  Lysle Pearl, PharmD, BCPS []  Phillips Climes, PharmD, BCPS []  Agapito Games, PharmD, BCPS []  Verlan Friends, PharmD [facility] PharmD  Positive urine culture Treated with cephalexin, organism sensitive to the same and no further patient follow-up is required at this time.  [name] 01/01/2018, 1:14 PM

## 2018-04-19 ENCOUNTER — Encounter (HOSPITAL_COMMUNITY): Payer: Self-pay | Admitting: Emergency Medicine

## 2018-04-19 ENCOUNTER — Emergency Department (HOSPITAL_COMMUNITY): Payer: Medicare Other

## 2018-04-19 ENCOUNTER — Other Ambulatory Visit: Payer: Self-pay

## 2018-04-19 ENCOUNTER — Emergency Department (HOSPITAL_COMMUNITY)
Admission: EM | Admit: 2018-04-19 | Discharge: 2018-04-19 | Disposition: A | Discharge status: Home / Self Care | Payer: Medicare Other | Attending: Emergency Medicine | Admitting: Emergency Medicine

## 2018-04-19 DIAGNOSIS — Y9389 Activity, other specified: Secondary | ICD-10-CM | POA: Insufficient documentation

## 2018-04-19 DIAGNOSIS — Z7982 Long term (current) use of aspirin: Secondary | ICD-10-CM | POA: Diagnosis not present

## 2018-04-19 DIAGNOSIS — N3001 Acute cystitis with hematuria: Secondary | ICD-10-CM

## 2018-04-19 DIAGNOSIS — S0990XA Unspecified injury of head, initial encounter: Secondary | ICD-10-CM | POA: Diagnosis present

## 2018-04-19 DIAGNOSIS — I10 Essential (primary) hypertension: Secondary | ICD-10-CM | POA: Insufficient documentation

## 2018-04-19 DIAGNOSIS — W1839XA Other fall on same level, initial encounter: Secondary | ICD-10-CM | POA: Insufficient documentation

## 2018-04-19 DIAGNOSIS — Y999 Unspecified external cause status: Secondary | ICD-10-CM | POA: Insufficient documentation

## 2018-04-19 DIAGNOSIS — W19XXXA Unspecified fall, initial encounter: Secondary | ICD-10-CM

## 2018-04-19 DIAGNOSIS — Y92121 Bathroom in nursing home as the place of occurrence of the external cause: Secondary | ICD-10-CM | POA: Diagnosis not present

## 2018-04-19 DIAGNOSIS — F0390 Unspecified dementia without behavioral disturbance: Secondary | ICD-10-CM | POA: Insufficient documentation

## 2018-04-19 DIAGNOSIS — E039 Hypothyroidism, unspecified: Secondary | ICD-10-CM | POA: Insufficient documentation

## 2018-04-19 DIAGNOSIS — Z79899 Other long term (current) drug therapy: Secondary | ICD-10-CM | POA: Insufficient documentation

## 2018-04-19 DIAGNOSIS — S0003XA Contusion of scalp, initial encounter: Secondary | ICD-10-CM | POA: Insufficient documentation

## 2018-04-19 LAB — CBC WITH DIFFERENTIAL/PLATELET
ABS IMMATURE GRANULOCYTES: 0.06 10*3/uL (ref 0.00–0.07)
BASOS PCT: 0 %
Basophils Absolute: 0 10*3/uL (ref 0.0–0.1)
EOS ABS: 0.1 10*3/uL (ref 0.0–0.5)
EOS PCT: 1 %
HCT: 44.6 % (ref 36.0–46.0)
Hemoglobin: 13.9 g/dL (ref 12.0–15.0)
Immature Granulocytes: 1 %
Lymphocytes Relative: 8 %
Lymphs Abs: 0.8 10*3/uL (ref 0.7–4.0)
MCH: 31 pg (ref 26.0–34.0)
MCHC: 31.2 g/dL (ref 30.0–36.0)
MCV: 99.6 fL (ref 80.0–100.0)
MONO ABS: 0.5 10*3/uL (ref 0.1–1.0)
MONOS PCT: 6 %
Neutro Abs: 7.5 10*3/uL (ref 1.7–7.7)
Neutrophils Relative %: 84 %
PLATELETS: 217 10*3/uL (ref 150–400)
RBC: 4.48 MIL/uL (ref 3.87–5.11)
RDW: 13.2 % (ref 11.5–15.5)
WBC: 8.9 10*3/uL (ref 4.0–10.5)
nRBC: 0 % (ref 0.0–0.2)

## 2018-04-19 LAB — COMPREHENSIVE METABOLIC PANEL
ALT: 15 U/L (ref 0–44)
AST: 21 U/L (ref 15–41)
Albumin: 4.2 g/dL (ref 3.5–5.0)
Alkaline Phosphatase: 72 U/L (ref 38–126)
Anion gap: 10 (ref 5–15)
BUN: 13 mg/dL (ref 8–23)
CALCIUM: 9.9 mg/dL (ref 8.9–10.3)
CO2: 26 mmol/L (ref 22–32)
CREATININE: 0.55 mg/dL (ref 0.44–1.00)
Chloride: 106 mmol/L (ref 98–111)
GFR calc Af Amer: >60 mL/min (ref >60)
Glucose, Bld: 91 mg/dL (ref 70–99)
Potassium: 3.4 mmol/L — ABNORMAL LOW (ref 3.5–5.1)
Sodium: 142 mmol/L (ref 135–145)
TOTAL PROTEIN: 7.4 g/dL (ref 6.5–8.1)
Total Bilirubin: 0.8 mg/dL (ref 0.3–1.2)

## 2018-04-19 LAB — PROTIME-INR
INR: 1 (ref 0.8–1.2)
Prothrombin Time: 12.8 seconds (ref 11.4–15.2)

## 2018-04-19 LAB — URINALYSIS, ROUTINE W REFLEX MICROSCOPIC
Bilirubin Urine: NEGATIVE
Glucose, UA: NEGATIVE mg/dL
KETONES UR: NEGATIVE mg/dL
Nitrite: NEGATIVE
PH: 6 (ref 5.0–8.0)
Protein, ur: NEGATIVE mg/dL
RBC / HPF: >50 RBC/hpf — ABNORMAL HIGH (ref 0–5)
SPECIFIC GRAVITY, URINE: 1.015 (ref 1.005–1.030)

## 2018-04-19 MED ORDER — ACETAMINOPHEN 500 MG PO TABS: 1000.0000 mg | ORAL_TABLET | Freq: Once | ORAL | Status: DC

## 2018-04-19 MED ORDER — CEPHALEXIN 500 MG PO CAPS: 500.0000 mg | ORAL_CAPSULE | Freq: Two times a day (BID) | ORAL | 0 refills | Status: DC

## 2018-04-19 NOTE — ED Notes (Signed)
Pt states she does not need to urinate at this time, aware of DO  

## 2018-04-19 NOTE — ED Provider Notes (Signed)
[facility] EMERGENCY DEPARTMENT Provider Note   CSN: [id number] Arrival date & time: 04/19/18  4098    History   Chief Complaint Chief Complaint  Patient presents with  . Fall    HPI RILLIE RIFFEL is a 85 y.o. female.     Patient with history of atrial fibrillation on Coumadin, dementia, reflux, arthritis presents after a fall at [facility].  Patient per report was in the bathroom and staff lost her balance and fell hitting the back of her head.  Patient does not recall details, no syncope reported.  Patient denies pain at this time.  Patient is on Coumadin.  Patient has no complaints at this time.  Patient denies recalling any symptoms during her fall.     Past Medical History:  Diagnosis Date  . Acute colitis 09/2011   C. difficile negative  . Arthritis   . Atrial fibrillation (HCC)    Onset well before 2004; Negative stress nuclear study in 01/2003  . Back pain   . Chronic anticoagulation   . Dementia (HCC)   . Depression   . Fractures   . GERD (gastroesophageal reflux disease)   . Hiatal hernia    by CT in 2013; also noted was an adrenal adenoma, duodenal lipoma right ovarian cyst, post hysterectomy  . History of spinal fracture   . Hypercalcemia   . Hyperlipidemia   . Hypertension   . Osteoporosis   . Pericardial effusion   . Pneumonia 09/2011   09/2011  . Syncope   . Thyroid disease    hypothyroid    Patient Active Problem List   Diagnosis Date Noted  . UTI (urinary tract infection) 01/11/2016  . Dementia (HCC) 01/11/2016  . Hypothyroidism 01/11/2016  . GERD (gastroesophageal reflux disease) 01/11/2016  . Gram-negative bacteremia 05/28/2014  . UTI (lower urinary tract infection) 05/28/2014  . Sepsis (HCC) 05/28/2014  . Hypokalemia 02/25/2013  . Hematuria 02/25/2013  . Fever 02/24/2013  . Acute bronchitis 02/24/2013  . Back pain 02/24/2013  . Left wrist fracture 02/24/2013  . Influenza due to identified novel influenza A virus with other  respiratory manifestations 02/24/2013  . Degenerative joint disease 08/17/2012  . Hypercalcemia 02/29/2012  . Acute pericardial effusion 02/25/2012  . Hypertension   . Chronic anticoagulation   . Hyperlipidemia   . Atrial fibrillation (HCC) 09/28/2011  . Acute encephalopathy 09/28/2011    Past Surgical History:  Procedure Laterality Date  . ABDOMINAL HYSTERECTOMY     No oophorectomy  . APPENDECTOMY    . COLONOSCOPY  2005   Reportedly normal screening study     OB History    Gravida  2   Para  2   Term  2   Preterm      AB      Living        SAB      TAB      Ectopic      Multiple      Live Births               Home Medications    Prior to Admission medications   Medication Sig Start Date End Date Taking? Authorizing Provider  acetaminophen (TYLENOL) 500 MG tablet Take 1,000 mg by mouth every 8 (eight) hours as needed for mild pain or moderate pain.     [provider]  aspirin EC 81 MG tablet Take 81 mg by mouth daily. For pain    [provider]  Calcium Carbonate-Vitamin D (  CALCIUM 600+D) 600-400 MG-UNIT tablet Take 1 tablet by mouth daily.    [provider]  cephALEXin (KEFLEX) 500 MG capsule Take 1 capsule (500 mg total) by mouth 2 (two) times daily. 04/19/18   Blane Ohara, MD  diltiazem (CARDIZEM) 120 MG tablet Take 120 mg by mouth daily.    [provider]  docusate sodium 100 MG CAPS Take 100 mg by mouth 2 (two) times daily. 02/26/13   Dellinger, Tora Kindred, PA-C  ENSURE (ENSURE) Take 237 mLs by mouth daily.    [provider]  escitalopram (LEXAPRO) 5 MG tablet Take 5 mg by mouth daily.  07/29/17   [provider]  fluconazole (DIFLUCAN) 150 MG tablet Take 150 mg by mouth once.    [provider]  levothyroxine (SYNTHROID, LEVOTHROID) 75 MCG tablet Take 75 mcg by mouth daily before breakfast.    [provider]  loperamide (IMODIUM A-D) 2 MG tablet Take 2 mg by mouth 4 (four)  times daily as needed for diarrhea or loose stools.    [provider]  omeprazole (PRILOSEC) 40 MG capsule Take 1 capsule (40 mg total) by mouth daily. For heartburn 05/14/12   Sharon Seller, NP  ondansetron (ZOFRAN) 4 MG tablet Take 4 mg by mouth every 8 (eight) hours as needed for nausea or vomiting.    [provider]  Polyethyl Glycol-Propyl Glycol (SYSTANE) 0.4-0.3 % SOLN Place 1 drop into both eyes 2 (two) times daily.    [provider]  traMADol (ULTRAM) 50 MG tablet Take by mouth every 6 (six) hours as needed for moderate pain.    [provider]  WARFARIN SODIUM PO Take 4.5 mg by mouth daily.    [provider]    Family History Family History  Problem Relation Age of Onset  . Cancer Mother        kidney  . Heart attack Father   . Hypertension Father     Social History Social History   Tobacco Use  . Smoking status: Never Smoker  . Smokeless tobacco: Never Used  Substance Use Topics  . Alcohol use: No  . Drug use: No     Allergies   Patient has no known allergies.   Review of Systems Review of Systems  Unable to perform ROS: Dementia     Physical Exam Updated Vital Signs BP (!) 189/89 (BP Location: Right Arm)   Pulse 86   Temp 97.9 F (36.6 C) (Oral)   Resp 18   Ht  (1.626 m)   Wt 61 kg   SpO2 98%   BMI 23.08 kg/m   Physical Exam Vitals signs and nursing note reviewed.  Constitutional:      Appearance: She is well-developed.  HENT:     Head: Normocephalic.     Comments: Patient has small hematoma approximately 2 cm left posterior scalp.  Neck supple full range of motion no midline tenderness. Eyes:     General:        Right eye: No discharge.        Left eye: No discharge.     Conjunctiva/sclera: Conjunctivae normal.  Neck:     Musculoskeletal: Normal range of motion and neck supple.     Trachea: No tracheal deviation.  Cardiovascular:     Rate and Rhythm: Normal rate. Rhythm  irregular.     Heart sounds: No murmur.  Pulmonary:     Effort: Pulmonary effort is normal.     Breath  sounds: Normal breath sounds.  Abdominal:     General: There is no distension.     Palpations: Abdomen is soft.     Tenderness: There is no abdominal tenderness. There is no guarding.  Musculoskeletal:        General: No swelling, tenderness, deformity or signs of injury.     Right lower leg: No edema.     Left lower leg: No edema.     Comments: Patient has no pain with range of motion of upper and lower extremities bilateral.  Specifically no hip tenderness with flexion and rotation of the hips.  No midline spine tenderness.  Skin:    General: Skin is warm.     Findings: No rash.  Neurological:     Mental Status: She is alert. Mental status is at baseline.     GCS: GCS eye subscore is 4. GCS verbal subscore is 4. GCS motor subscore is 6.     Comments: Finger-to-nose intact, pupils equal bilateral, extraocular muscle function intact.  Mild general confusion at baseline per report.  Patient moves all extremities with equal 5+ strength.  Gross sensation intact palpation bilateral      ED Treatments / Results  Labs (all labs ordered are listed, but only abnormal results are displayed) Labs Reviewed  COMPREHENSIVE METABOLIC PANEL - Abnormal; Notable for the following components:      Result Value   Potassium 3.4 (*)    All other components within normal limits  URINALYSIS, ROUTINE W REFLEX MICROSCOPIC - Abnormal; Notable for the following components:   APPearance CLOUDY (*)    Hgb urine dipstick LARGE (*)    Leukocytes,Ua LARGE (*)    RBC / HPF >50 (*)    WBC, UA >50 (*)    Bacteria, UA RARE (*)    All other components within normal limits  URINE CULTURE  CBC WITH DIFFERENTIAL/PLATELET  PROTIME-INR    EKG EKG Interpretation  Date/Time:  Thursday April 19 2018 08:18:16 EST Ventricular Rate:  75 PR Interval:    QRS Duration: 92 QT Interval:  412 QTC  Calculation: 461 R Axis:   -21 Text Interpretation:  Atrial fibrillation Borderline left axis deviation Anterior infarct, old Confirmed by Blane Ohara [phone]) on 04/19/2018 8:23:05 AM   Radiology Ct Head Wo Contrast  Result Date: 04/19/2018 CLINICAL DATA:  Fall today.  Head injury EXAM: CT HEAD WITHOUT CONTRAST CT CERVICAL SPINE WITHOUT CONTRAST TECHNIQUE: Multidetector CT imaging of the head and cervical spine was performed following the standard protocol without intravenous contrast. Multiplanar CT image reconstructions of the cervical spine were also generated. COMPARISON:  CT head 01/11/2016 FINDINGS: CT HEAD FINDINGS Brain: Moderate atrophy unchanged. Negative for hydrocephalus. Mild chronic white matter changes. Negative for acute infarct, hemorrhage, or mass lesion. Vascular: Negative for hyperdense vessel Skull: Negative Sinuses/Orbits: Paranasal sinuses clear. Bilateral cataract surgery. Other: None CT CERVICAL SPINE FINDINGS Alignment: Normal Skull base and vertebrae: Negative for fracture Soft tissues and spinal canal: Negative for soft tissue mass or edema Disc levels: Diffuse disc and facet degeneration throughout the cervical spine, mild-to-moderate in degree. Extensive degenerative changes C1-2 Upper chest: Negative Other: None IMPRESSION: 1. No acute intracranial abnormality. 2. Negative for cervical spine fracture. Diffuse degenerative changes. Electronically Signed   By: Marlan Palau M.D.   On: 04/19/2018 10:14   Ct Cervical Spine Wo Contrast  Result Date: 04/19/2018 CLINICAL DATA:  Fall today.  Head injury EXAM: CT HEAD WITHOUT CONTRAST CT CERVICAL SPINE WITHOUT CONTRAST TECHNIQUE: Multidetector CT  imaging of the head and cervical spine was performed following the standard protocol without intravenous contrast. Multiplanar CT image reconstructions of the cervical spine were also generated. COMPARISON:  CT head 01/11/2016 FINDINGS: CT HEAD FINDINGS Brain: Moderate atrophy unchanged.  Negative for hydrocephalus. Mild chronic white matter changes. Negative for acute infarct, hemorrhage, or mass lesion. Vascular: Negative for hyperdense vessel Skull: Negative Sinuses/Orbits: Paranasal sinuses clear. Bilateral cataract surgery. Other: None CT CERVICAL SPINE FINDINGS Alignment: Normal Skull base and vertebrae: Negative for fracture Soft tissues and spinal canal: Negative for soft tissue mass or edema Disc levels: Diffuse disc and facet degeneration throughout the cervical spine, mild-to-moderate in degree. Extensive degenerative changes C1-2 Upper chest: Negative Other: None IMPRESSION: 1. No acute intracranial abnormality. 2. Negative for cervical spine fracture. Diffuse degenerative changes. Electronically Signed   By: Marlan Palau M.D.   On: 04/19/2018 10:14    Procedures Procedures (including critical care time)  Medications Ordered in ED Medications  acetaminophen (TYLENOL) tablet 1,000 mg (has no administration in time range)     Initial Impression / Assessment and Plan / ED Course  I have reviewed the triage vital signs and the nursing notes.  Pertinent labs & imaging results that were available during my care of the patient were reviewed by me and considered in my medical decision making (see chart for details).  Clinical Course as of Apr 20 1123  Thu Apr 19, 2018  1032 INR: 1.0 [SJ]  1038 Urinalysis, Routine w reflex microscopic [SJ]  1047 Urinalysis, Routine w reflex microscopic [SJ]  1055 Urinalysis, Routine w reflex microscopic [SJ]    Clinical Course User Index [SJ] Jaimes, Silvestre, Student-PA      Well-appearing elderly female presents after brief fall and head injury.  With patient's age and on Coumadin plan for CT scan to look for intracranial bleed such as subdural.  Plan for basic blood work to check for anemia, electrolyte abnormality.  With mild general confusion plan to discussed with nursing home to clarify patient's baseline and any other  details.  Nursing home contacted [facility] coordinator explains at baseline, can feed herself, during transfer slipped and went down, uses wheelchair at baseline, no syncope or seizure.  Contact family [phone] Yentl Rudesill, cell [phone].  EKG reviewed A. fib similar to previous.  Blood work reviewed no acute abnormalities minimally low potassium.  CT scan no acute bleeding or fracture.  Urinalysis consistent with mild infection and with mild confusion plan for Keflex and follow-up culture.  Family contacted to update.  Patient stable for discharge.  Tylenol given for pain.  Final Clinical Impressions(s) / ED Diagnoses   Final diagnoses:  Acute head injury, initial encounter  Contusion of scalp, initial encounter  Fall, initial encounter  Acute cystitis with hematuria    ED Discharge Orders         Ordered    cephALEXin (KEFLEX) 500 MG capsule  2 times daily     04/19/18 1122           Blane Ohara, MD 04/19/18 1126

## 2018-04-19 NOTE — Discharge Instructions (Addendum)
Return for worsening confusion, fevers, new areas of pain or new concerns. Take antibiotics for 5 days, follow up culture with your doctor on Monday.

## 2018-04-19 NOTE — ED Triage Notes (Addendum)
From [place] Lost balance while in the bathroom with staff member.  Has knot on back of LT side of head.

## 2018-04-21 LAB — URINE CULTURE: Culture: >=100000 — AB

## 2018-04-22 ENCOUNTER — Telehealth: Payer: Self-pay | Admitting: Emergency Medicine

## 2018-04-22 NOTE — Telephone Encounter (Signed)
Post ED Visit - Positive Culture Follow-up  Culture report reviewed by antimicrobial stewardship pharmacist: [facility] []  Enzo Bi, Pharm.D. []  Celedonio Miyamoto, Pharm.D., BCPS AQ-ID []  Garvin Fila, Pharm.D., BCPS []  Georgina Pillion, Pharm.D., BCPS []  [place], [address].D., BCPS, AAHIVP []  Estella Husk, Pharm.D., BCPS, AAHIVP []  Lysle Pearl, PharmD, BCPS []  Phillips Climes, PharmD, BCPS [x]  Agapito Games, PharmD, BCPS []  Verlan Friends, PharmD []  Mervyn Gay, PharmD, BCPS []  Vinnie Level, PharmD  [facility] []  Len Childs, PharmD []  Greer Pickerel, PharmD []  Adalberto Cole, PharmD []  Perlie Gold, Rph []  Lonell Face) Jean Rosenthal, PharmD []  Earl Many, PharmD []  Junita Push, PharmD []  Dorna Leitz, PharmD []  Terrilee Files, PharmD []  Lynann Beaver, PharmD []  Keturah Barre, PharmD []  Loralee Pacas, PharmD []  Bernadene Person, PharmD   Positive urine culture Treated with Cephalexin, organism sensitive to the same and no further patient follow-up is required at this time.  Carollee Herter Gorge Almanza 04/22/2018, 10:46 AM

## 2018-06-29 IMAGING — CT CT ABD-PELV W/ CM
2 of 5 series · 15 of 46 positions shown, 17 images · IV contrast (iopamidol)
Comparison: 09/28/2011 and lumbar spine radiographs on 12/11/2014

CLINICAL DATA: Abdominal pain and nausea for 4 days.

EXAM:
CT ABDOMEN AND PELVIS WITH CONTRAST
TECHNIQUE: Multidetector CT imaging of the abdomen and pelvis was performed
using the standard protocol following bolus administration of
intravenous contrast.
CONTRAST:  100mL [id number] IOPAMIDOL ([id number]) INJECTION 61%

[Series 2: axial st · axial · 0.85mm/px · z∈[-622,-228]mm · 12 of 91 slices shown, 14 images]
[im 6/91  soft-tissue]
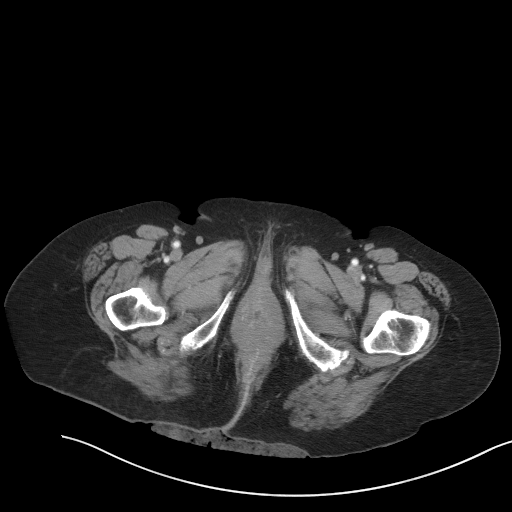
[im 6/91  bone]
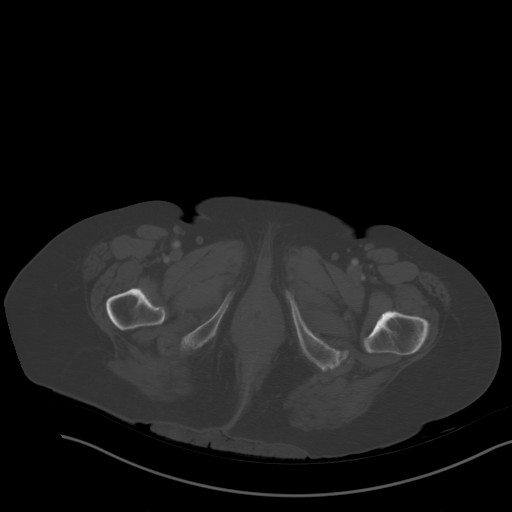
[im 16/91  soft-tissue]
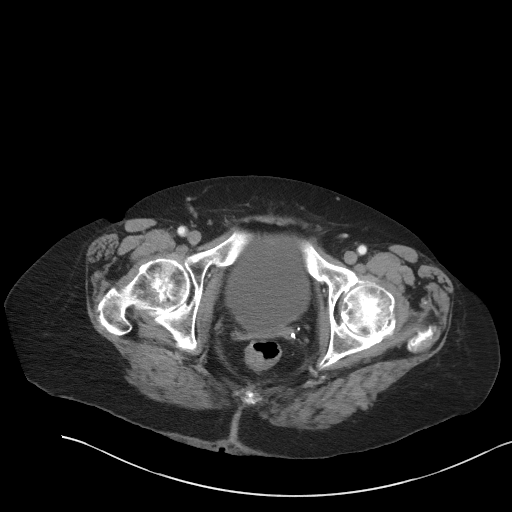
[im 22/91  soft-tissue]
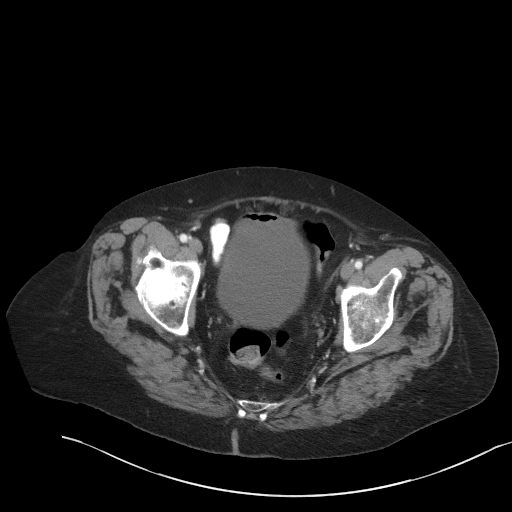
[im 27/91  soft-tissue]
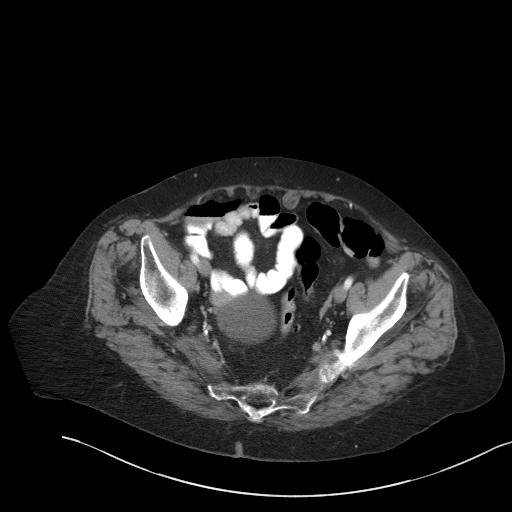
[im 38/91  soft-tissue]
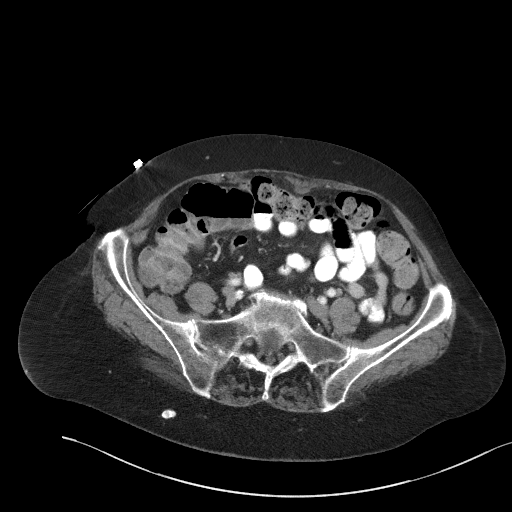
[im 43/91  soft-tissue]
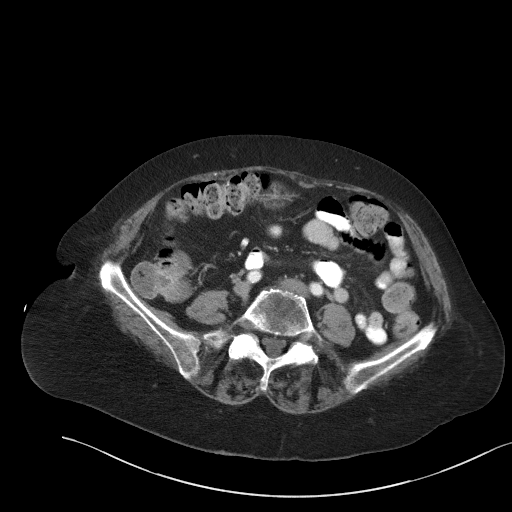
[im 48/91  soft-tissue]
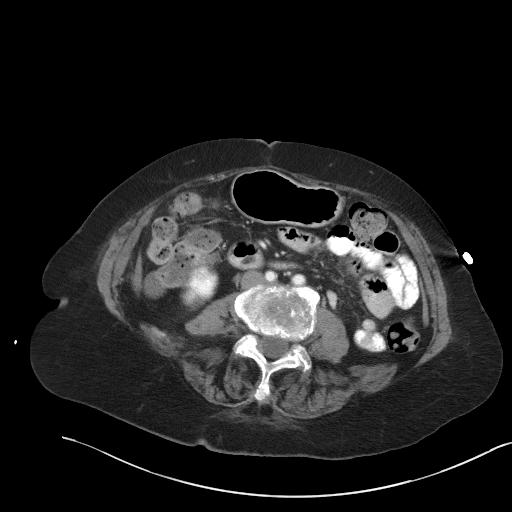
[im 59/91  soft-tissue]
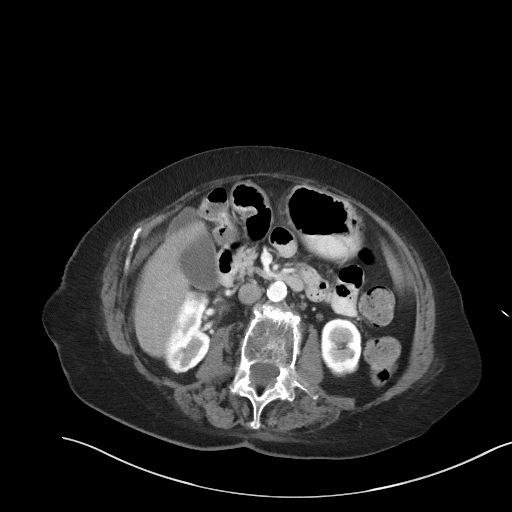
[im 64/91  soft-tissue]
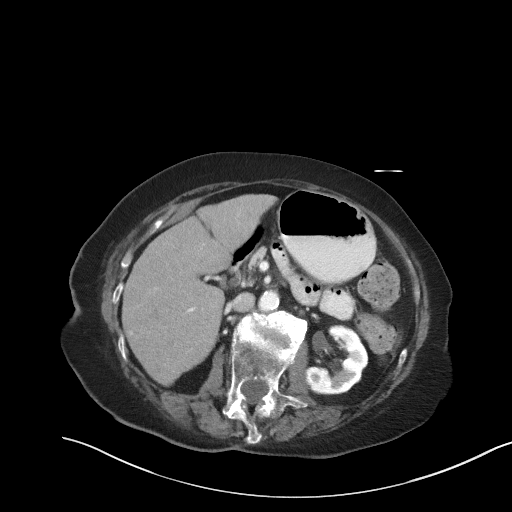
[im 64/91  bone]
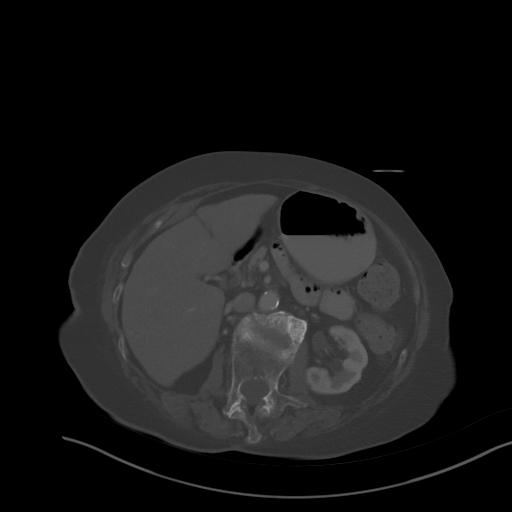
[im 69/91  soft-tissue]
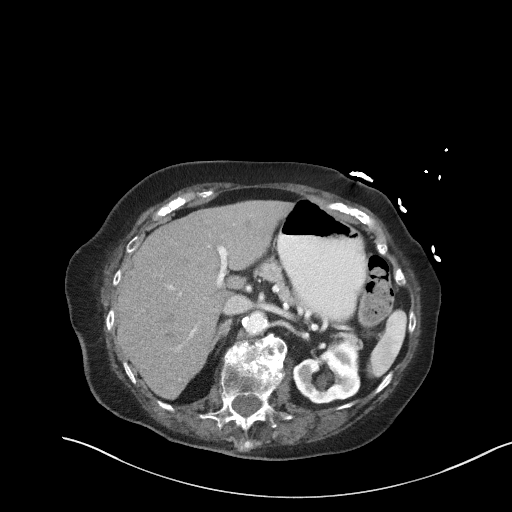
[im 80/91  soft-tissue]
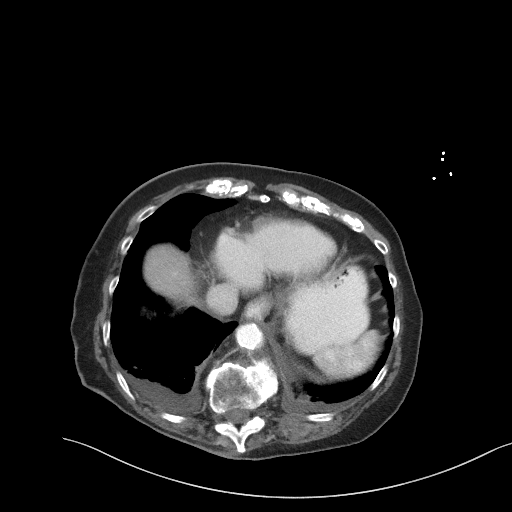
[im 85/91  soft-tissue]
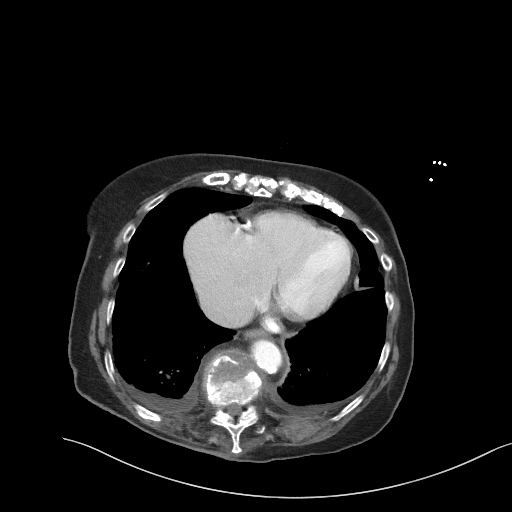

[Series 4: coronal st · coronal · 0.79mm/px · 3 of 89 slices shown]
[im 30/89  soft-tissue]
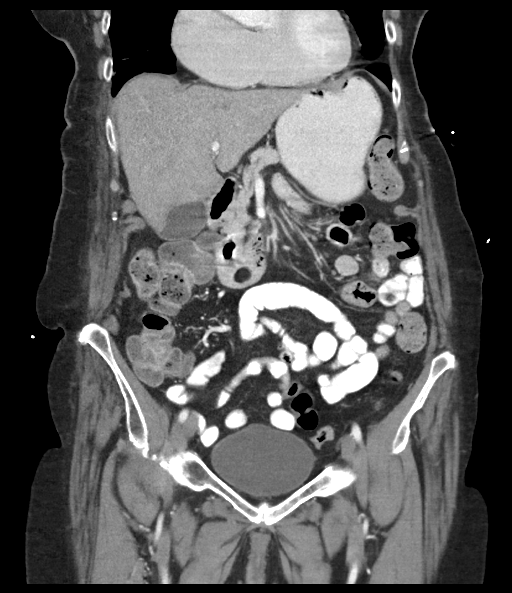
[im 40/89  soft-tissue]
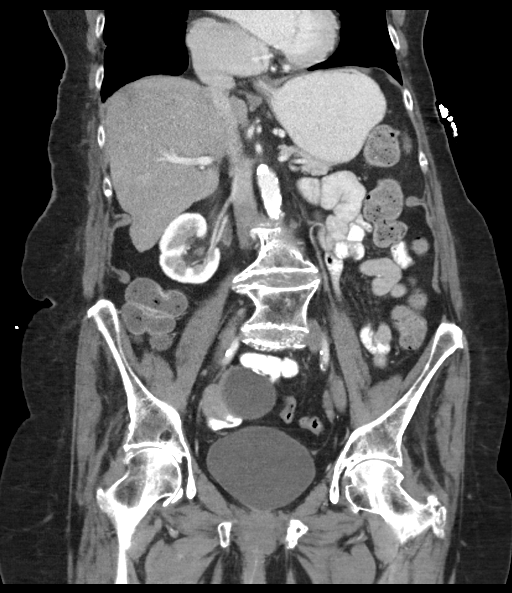
[im 49/89  soft-tissue]
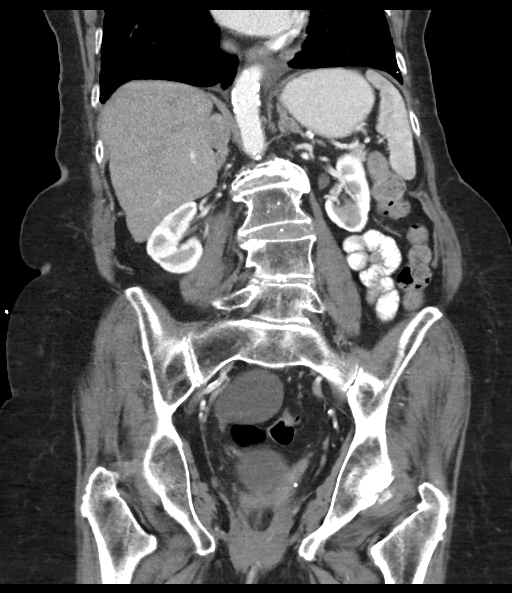

[15 of 46 positions shown; findings below may reference images not displayed]

FINDINGS: Lower Chest: Persistent tiny bilateral pleural effusions versus
pleural thickening. Increased cardiomegaly and right atrial
enlargement.

Hepatobiliary:  No masses identified. Gallbladder is unremarkable.

Pancreas:  No mass or inflammatory changes.

Spleen: Within normal limits in size and appearance.

Adrenals/Urinary Tract: Stable 2.3 cm right adrenal mass, consistent
with benign adenoma.

A 6 mm calculus is seen in the posterior midpole the right kidney. A
7 mm calculus is also seen within the right renal pelvis. No
evidence of ureteral calculi or hydronephrosis. No renal masses
identified. Urinary bladder contains a small amount of intraluminal
gas, likely due to recent instrumentation, but is otherwise
unremarkable.

Stomach/Bowel: No evidence of obstruction, inflammatory process or
abnormal fluid collections.

Vascular/Lymphatic: No pathologically enlarged lymph nodes. No
abdominal aortic aneurysm. Aortic atherosclerosis.

Reproductive: Previous hysterectomy. A simple appearing cyst is seen
in the right adnexa which measures 6.0 x 5.2 cm. This is increased
in size from 3.9 x 3.7 cm on 2877 exam. No other pelvic masses or
ascites identified.

Other:  None.

Musculoskeletal: No suspicious lytic or sclerotic bone lesions
identified. Lower thoracic and lumbar spine degenerative changes are
noted. Old T12 vertebral body compression fracture again
demonstrated. An acute fracture is seen through the anterior
inferior aspect of the T10 vertebral body. No evidence of
spondylolisthesis.
IMPRESSION: Acute fracture involving the anterior inferior aspect of the T10
vertebral body. No evidence of spondylolisthesis.

7 mm calculus within the right renal pelvis and 6 mm calculus in
midpole of right kidney. No evidence of ureteral calculi or
hydronephrosis.

**An incidental finding of potential clinical significance has been
found. 6 cm simple appearing right adnexal cyst, which is increased
in size from 4 cm on 2877 exam. This is suspicious for a benign or
low malignant potential cystic ovarian neoplasm in a postmenopausal
female. Recommend further characterization with pelvic ultrasound.
This recommendation follows ACR consensus guidelines: White Paper of
the ACR Incidental Findings Committee II on Adnexal Findings. [HOSPITAL] [DATE].**

Intraluminal gas noted within urinary bladder. Recommend clinical
correlation for recent instrumentation.

Increased cardiomegaly and right atrial enlargement since 2877 exam.

## 2019-10-22 ENCOUNTER — Emergency Department (HOSPITAL_COMMUNITY): Payer: Medicare PPO

## 2019-10-22 ENCOUNTER — Encounter (HOSPITAL_COMMUNITY): Payer: Self-pay | Admitting: Emergency Medicine

## 2019-10-22 ENCOUNTER — Emergency Department (HOSPITAL_COMMUNITY)
Admission: EM | Admit: 2019-10-22 | Discharge: 2019-10-22 | Disposition: A | Discharge status: Home / Self Care | Payer: Medicare PPO | Attending: Emergency Medicine | Admitting: Emergency Medicine

## 2019-10-22 ENCOUNTER — Other Ambulatory Visit: Payer: Self-pay

## 2019-10-22 DIAGNOSIS — E039 Hypothyroidism, unspecified: Secondary | ICD-10-CM | POA: Diagnosis not present

## 2019-10-22 DIAGNOSIS — F0390 Unspecified dementia without behavioral disturbance: Secondary | ICD-10-CM | POA: Insufficient documentation

## 2019-10-22 DIAGNOSIS — Z7982 Long term (current) use of aspirin: Secondary | ICD-10-CM | POA: Diagnosis not present

## 2019-10-22 DIAGNOSIS — R4182 Altered mental status, unspecified: Secondary | ICD-10-CM | POA: Insufficient documentation

## 2019-10-22 DIAGNOSIS — I10 Essential (primary) hypertension: Secondary | ICD-10-CM | POA: Diagnosis not present

## 2019-10-22 DIAGNOSIS — Z79899 Other long term (current) drug therapy: Secondary | ICD-10-CM | POA: Insufficient documentation

## 2019-10-22 DIAGNOSIS — Z7901 Long term (current) use of anticoagulants: Secondary | ICD-10-CM | POA: Insufficient documentation

## 2019-10-22 DIAGNOSIS — N3000 Acute cystitis without hematuria: Secondary | ICD-10-CM

## 2019-10-22 DIAGNOSIS — Z79890 Hormone replacement therapy (postmenopausal): Secondary | ICD-10-CM | POA: Insufficient documentation

## 2019-10-22 LAB — CBC WITH DIFFERENTIAL/PLATELET
Abs Immature Granulocytes: 0.02 10*3/uL (ref 0.00–0.07)
Basophils Absolute: 0 10*3/uL (ref 0.0–0.1)
Basophils Relative: 1 %
Eosinophils Absolute: 0.1 10*3/uL (ref 0.0–0.5)
Eosinophils Relative: 1 %
HCT: 45.4 % (ref 36.0–46.0)
Hemoglobin: 14.4 g/dL (ref 12.0–15.0)
Immature Granulocytes: 0 %
Lymphocytes Relative: 20 %
Lymphs Abs: 1.2 10*3/uL (ref 0.7–4.0)
MCH: 32.2 pg (ref 26.0–34.0)
MCHC: 31.7 g/dL (ref 30.0–36.0)
MCV: 101.6 fL — ABNORMAL HIGH (ref 80.0–100.0)
Monocytes Absolute: 0.6 10*3/uL (ref 0.1–1.0)
Monocytes Relative: 9 %
Neutro Abs: 4.1 10*3/uL (ref 1.7–7.7)
Neutrophils Relative %: 69 %
Platelets: 215 10*3/uL (ref 150–400)
RBC: 4.47 MIL/uL (ref 3.87–5.11)
RDW: 14.1 % (ref 11.5–15.5)
WBC: 6 10*3/uL (ref 4.0–10.5)
nRBC: 0 % (ref 0.0–0.2)

## 2019-10-22 LAB — BASIC METABOLIC PANEL
Anion gap: 13 (ref 5–15)
BUN: 13 mg/dL (ref 8–23)
CO2: 26 mmol/L (ref 22–32)
Calcium: 9.4 mg/dL (ref 8.9–10.3)
Chloride: 101 mmol/L (ref 98–111)
Creatinine, Ser: 0.59 mg/dL (ref 0.44–1.00)
GFR calc Af Amer: >60 mL/min (ref >60)
GFR calc non Af Amer: >60 mL/min (ref >60)
Glucose, Bld: 79 mg/dL (ref 70–99)
Potassium: 3.2 mmol/L — ABNORMAL LOW (ref 3.5–5.1)
Sodium: 140 mmol/L (ref 135–145)

## 2019-10-22 LAB — URINALYSIS, ROUTINE W REFLEX MICROSCOPIC
Bilirubin Urine: NEGATIVE
Glucose, UA: NEGATIVE mg/dL
Ketones, ur: NEGATIVE mg/dL
Nitrite: POSITIVE — AB
Protein, ur: 30 mg/dL — AB
Specific Gravity, Urine: 1.013 (ref 1.005–1.030)
WBC, UA: >50 WBC/hpf — ABNORMAL HIGH (ref 0–5)
pH: 6 (ref 5.0–8.0)

## 2019-10-22 LAB — PROTIME-INR
INR: 0.9 (ref 0.8–1.2)
Prothrombin Time: 12.1 seconds (ref 11.4–15.2)

## 2019-10-22 MED ORDER — SODIUM CHLORIDE 0.9 % IV BOLUS
1000.0000 mL | Freq: Once | INTRAVENOUS | Status: AC
  Administered 2019-10-22: 1000 mL via INTRAVENOUS

## 2019-10-22 MED ORDER — SODIUM CHLORIDE 0.9 % IV SOLN
1.0000 g | Freq: Once | INTRAVENOUS | Status: AC
  Administered 2019-10-22: 1 g via INTRAVENOUS
  Filled 2019-10-22: qty 10

## 2019-10-22 MED ORDER — CEPHALEXIN 500 MG PO CAPS: 500.0000 mg | ORAL_CAPSULE | Freq: Two times a day (BID) | ORAL | 0 refills | Status: DC

## 2019-10-22 MED ORDER — LABETALOL HCL 5 MG/ML IV SOLN
10.0000 mg | Freq: Once | INTRAVENOUS | Status: AC
  Administered 2019-10-22: 10 mg via INTRAVENOUS
  Filled 2019-10-22: qty 4

## 2019-10-22 NOTE — ED Triage Notes (Signed)
Pt from [facility]. Facility called EMS due to pt being pale and altered. Pt states nothing is wrong with her she just wants to go back to sleep.

## 2019-10-22 NOTE — ED Notes (Signed)
Gave pt another warm blanket.

## 2019-10-22 NOTE — ED Notes (Signed)
Called For EMS transport back to [place].

## 2019-10-22 NOTE — ED Notes (Signed)
I&O cath performed to obtain UA. Pt very alert and mimicking staff while trying to talk to her. Pt hitting and kicking staff. Nursing staff attempted to redirect pt with minimal success. UA was obtained and sent to lab. Dr. Judd Lien notified and reports that her family reports this is her normal.

## 2019-10-22 NOTE — Discharge Instructions (Addendum)
Begin taking Keflex as prescribed.  Continue other medications as previously prescribed.  Return to the emergency department if you develop any new and/or concerning symptoms.

## 2019-10-22 NOTE — ED Provider Notes (Signed)
[facility] EMERGENCY DEPARTMENT Provider Note   CSN: [id number] Arrival date & time: 10/22/19  8938     History Chief Complaint  Patient presents with  . Altered Mental Status    Tamara [name] is a 86 y.o. female.  Patient is a 86 year old female with history of dementia, atrial fibrillation on Coumadin, hypertension.  She is sent from her extended care facility for evaluation of altered mental status.  According to the facility, she is somnolent and appeared pale.  She was then sent here for further evaluation.  Patient adds no additional history.  When I asked questions, she will either shake or nod her head and seems to do so appropriately.  When I asked her if anything is bothering her, she shakes her head 'no', then goes back to sleep.  She apparently told the nurse that there is "nothing wrong with her and she just wants to go back to sleep.  Patient declined to speak to me despite multiple attempts to communicate with her.  The history is provided by the patient.  Altered Mental Status Presenting symptoms: behavior changes and lethargy   Severity:  Moderate Most recent episode:  Today Episode history:  Single Timing:  Constant Progression:  Unchanged Chronicity:  New Context: dementia        Past Medical History:  Diagnosis Date  . Acute colitis 09/2011   C. difficile negative  . Arthritis   . Atrial fibrillation (HCC)    Onset well before 2004; Negative stress nuclear study in 01/2003  . Back pain   . Chronic anticoagulation   . Dementia (HCC)   . Depression   . Fractures   . GERD (gastroesophageal reflux disease)   . Hiatal hernia    by CT in 2013; also noted was an adrenal adenoma, duodenal lipoma right ovarian cyst, post hysterectomy  . History of spinal fracture   . Hypercalcemia   . Hyperlipidemia   . Hypertension   . Osteoporosis   . Pericardial effusion   . Pneumonia 09/2011   09/2011  . Syncope   . Thyroid disease    hypothyroid     Patient Active Problem List   Diagnosis Date Noted  . UTI (urinary tract infection) 01/11/2016  . Dementia (HCC) 01/11/2016  . Hypothyroidism 01/11/2016  . GERD (gastroesophageal reflux disease) 01/11/2016  . Gram-negative bacteremia 05/28/2014  . UTI (lower urinary tract infection) 05/28/2014  . Sepsis (HCC) 05/28/2014  . Hypokalemia 02/25/2013  . Hematuria 02/25/2013  . Fever 02/24/2013  . Acute bronchitis 02/24/2013  . Back pain 02/24/2013  . Left wrist fracture 02/24/2013  . Influenza due to identified novel influenza A virus with other respiratory manifestations 02/24/2013  . Degenerative joint disease 08/17/2012  . Hypercalcemia 02/29/2012  . Acute pericardial effusion 02/25/2012  . Hypertension   . Chronic anticoagulation   . Hyperlipidemia   . Atrial fibrillation (HCC) 09/28/2011  . Acute encephalopathy 09/28/2011    Past Surgical History:  Procedure Laterality Date  . ABDOMINAL HYSTERECTOMY     No oophorectomy  . APPENDECTOMY    . COLONOSCOPY  2005   Reportedly normal screening study     OB History    Gravida  2   Para  2   Term  2   Preterm      AB      Living        SAB      TAB      Ectopic      Multiple  Live Births              Family History  Problem Relation Age of Onset  . Cancer Mother        kidney  . Heart attack Father   . Hypertension Father     Social History   Tobacco Use  . Smoking status: Never Smoker  . Smokeless tobacco: Never Used  Substance Use Topics  . Alcohol use: No  . Drug use: No    Home Medications Prior to Admission medications   Medication Sig Start Date End Date Taking? Authorizing Provider  acetaminophen (TYLENOL) 500 MG tablet Take 1,000 mg by mouth every 8 (eight) hours as needed for mild pain or moderate pain.     [provider]  aspirin EC 81 MG tablet Take 81 mg by mouth daily. For pain    [provider]  Calcium Carbonate-Vitamin D (CALCIUM 600+D) 600-400  MG-UNIT tablet Take 1 tablet by mouth daily.    [provider]  cephALEXin (KEFLEX) 500 MG capsule Take 1 capsule (500 mg total) by mouth 2 (two) times daily. 04/19/18   Blane Ohara, MD  diltiazem (CARDIZEM) 120 MG tablet Take 120 mg by mouth daily.    [provider]  docusate sodium 100 MG CAPS Take 100 mg by mouth 2 (two) times daily. 02/26/13   Dellinger, Tora Kindred, PA-C  ENSURE (ENSURE) Take 237 mLs by mouth daily.    [provider]  escitalopram (LEXAPRO) 5 MG tablet Take 5 mg by mouth daily.  07/29/17   [provider]  fluconazole (DIFLUCAN) 150 MG tablet Take 150 mg by mouth once.    [provider]  levothyroxine (SYNTHROID, LEVOTHROID) 75 MCG tablet Take 75 mcg by mouth daily before breakfast.    [provider]  loperamide (IMODIUM A-D) 2 MG tablet Take 2 mg by mouth 4 (four) times daily as needed for diarrhea or loose stools.    [provider]  omeprazole (PRILOSEC) 40 MG capsule Take 1 capsule (40 mg total) by mouth daily. For heartburn 05/14/12   Sharon Seller, NP  ondansetron (ZOFRAN) 4 MG tablet Take 4 mg by mouth every 8 (eight) hours as needed for nausea or vomiting.    [provider]  Polyethyl Glycol-Propyl Glycol (SYSTANE) 0.4-0.3 % SOLN Place 1 drop into both eyes 2 (two) times daily.    [provider]  traMADol (ULTRAM) 50 MG tablet Take by mouth every 6 (six) hours as needed for moderate pain.    [provider]  WARFARIN SODIUM PO Take 4.5 mg by mouth daily.    [provider]    Allergies    Patient has no known allergies.  Review of Systems   Review of Systems  Unable to perform ROS: Dementia    Physical Exam Updated Vital Signs Ht 5\' 4"  (1.626 m)   Wt 62 kg   BMI 23.46 kg/m   Physical Exam Vitals and nursing note reviewed.  Constitutional:      General: She is not in acute distress.    Appearance: She is well-developed. She is not diaphoretic.    HENT:     Head: Normocephalic and atraumatic.  Eyes:     Extraocular Movements: Extraocular movements intact.     Pupils: Pupils are equal, round, and reactive to light.  Cardiovascular:     Rate and Rhythm: Normal rate and regular rhythm.     Heart sounds: No murmur heard.  No friction  rub. No gallop.   Pulmonary:     Effort: Pulmonary effort is normal. No respiratory distress.     Breath sounds: Normal breath sounds. No wheezing.  Abdominal:     General: Bowel sounds are normal. There is no distension.     Palpations: Abdomen is soft.     Tenderness: There is no abdominal tenderness.  Musculoskeletal:        General: Normal range of motion.     Cervical back: Normal range of motion and neck supple.  Skin:    General: Skin is warm and dry.  Neurological:     Mental Status: She is alert.     Comments: Patient is somnolent, but arousable.  She moves all 4 extremities.  Pupils are 3 mm and reactive.  Patient will respond yes or no by shaking her head and appears to do so appropriately.  Neurologic exam is difficult secondary to somnolence and refusal to cooperate.     ED Results / Procedures / Treatments   Labs (all labs ordered are listed, but only abnormal results are displayed) Labs Reviewed  BASIC METABOLIC PANEL  CBC WITH DIFFERENTIAL/PLATELET  PROTIME-INR  URINALYSIS, ROUTINE W REFLEX MICROSCOPIC    EKG None  Radiology No results found.  Procedures Procedures (including critical care time)  Medications Ordered in ED Medications - No data to display  ED Course  I have reviewed the triage vital signs and the nursing notes.  Pertinent labs & imaging results that were available during my care of the patient were reviewed by me and considered in my medical decision making (see chart for details).    MDM Rules/Calculators/A&P  Patient sent here from her extended care facility for evaluation of altered mental status.  According to staff there, she was less  responsive and appeared pale.  Patient will open eyes and stare at me when I speak with her.  She will follow commands, then nods back off to sleep.    Several hours later while her work-up was in process, she became much more alert.  She is now interacting with me and from what her daughter-in-law says, she appears to be to be back to her baseline.  Patient's work-up does show unremarkable CBC and basic metabolic panel.  Patient's head CT is negative.  Urinalysis pending.  If normal, patient to be discharged with as needed return.  If consistent with infection, patient will be treated with antibiotics.  Final Clinical Impression(s) / ED Diagnoses Final diagnoses:  None    Rx / DC Orders ED Discharge Orders    None       Geoffery Lyons, MD 10/23/19 0700

## 2019-12-02 ENCOUNTER — Encounter (HOSPITAL_COMMUNITY): Payer: Self-pay

## 2019-12-02 ENCOUNTER — Emergency Department (HOSPITAL_COMMUNITY)
Admission: EM | Admit: 2019-12-02 | Discharge: 2019-12-02 | Disposition: A | Discharge status: Home / Self Care | Payer: Medicare PPO | Attending: Emergency Medicine | Admitting: Emergency Medicine

## 2019-12-02 ENCOUNTER — Emergency Department (HOSPITAL_COMMUNITY): Payer: Medicare PPO

## 2019-12-02 ENCOUNTER — Other Ambulatory Visit: Payer: Self-pay

## 2019-12-02 DIAGNOSIS — I10 Essential (primary) hypertension: Secondary | ICD-10-CM | POA: Insufficient documentation

## 2019-12-02 DIAGNOSIS — Z7901 Long term (current) use of anticoagulants: Secondary | ICD-10-CM | POA: Diagnosis not present

## 2019-12-02 DIAGNOSIS — M160 Bilateral primary osteoarthritis of hip: Secondary | ICD-10-CM | POA: Insufficient documentation

## 2019-12-02 DIAGNOSIS — Z79899 Other long term (current) drug therapy: Secondary | ICD-10-CM | POA: Diagnosis not present

## 2019-12-02 DIAGNOSIS — F0390 Unspecified dementia without behavioral disturbance: Secondary | ICD-10-CM | POA: Insufficient documentation

## 2019-12-02 DIAGNOSIS — N3000 Acute cystitis without hematuria: Secondary | ICD-10-CM | POA: Diagnosis not present

## 2019-12-02 DIAGNOSIS — E039 Hypothyroidism, unspecified: Secondary | ICD-10-CM | POA: Diagnosis not present

## 2019-12-02 DIAGNOSIS — M25551 Pain in right hip: Secondary | ICD-10-CM | POA: Diagnosis present

## 2019-12-02 LAB — COMPREHENSIVE METABOLIC PANEL
ALT: 12 U/L (ref 0–44)
AST: 21 U/L (ref 15–41)
Albumin: 3.5 g/dL (ref 3.5–5.0)
Alkaline Phosphatase: 61 U/L (ref 38–126)
Anion gap: 14 (ref 5–15)
BUN: 11 mg/dL (ref 8–23)
CO2: 24 mmol/L (ref 22–32)
Calcium: 8.6 mg/dL — ABNORMAL LOW (ref 8.9–10.3)
Chloride: 101 mmol/L (ref 98–111)
Creatinine, Ser: 0.51 mg/dL (ref 0.44–1.00)
GFR, Estimated: >60 mL/min (ref >60)
Glucose, Bld: 93 mg/dL (ref 70–99)
Potassium: 3.5 mmol/L (ref 3.5–5.1)
Sodium: 139 mmol/L (ref 135–145)
Total Bilirubin: 1.2 mg/dL (ref 0.3–1.2)
Total Protein: 6.3 g/dL — ABNORMAL LOW (ref 6.5–8.1)

## 2019-12-02 LAB — CBC WITH DIFFERENTIAL/PLATELET
Abs Immature Granulocytes: 0.06 10*3/uL (ref 0.00–0.07)
Basophils Absolute: 0 10*3/uL (ref 0.0–0.1)
Basophils Relative: 1 %
Eosinophils Absolute: 0.1 10*3/uL (ref 0.0–0.5)
Eosinophils Relative: 1 %
HCT: 43.8 % (ref 36.0–46.0)
Hemoglobin: 14.3 g/dL (ref 12.0–15.0)
Immature Granulocytes: 1 %
Lymphocytes Relative: 14 %
Lymphs Abs: 0.9 10*3/uL (ref 0.7–4.0)
MCH: 31.8 pg (ref 26.0–34.0)
MCHC: 32.6 g/dL (ref 30.0–36.0)
MCV: 97.6 fL (ref 80.0–100.0)
Monocytes Absolute: 0.6 10*3/uL (ref 0.1–1.0)
Monocytes Relative: 9 %
Neutro Abs: 4.8 10*3/uL (ref 1.7–7.7)
Neutrophils Relative %: 74 %
Platelets: 168 10*3/uL (ref 150–400)
RBC: 4.49 MIL/uL (ref 3.87–5.11)
RDW: 13.6 % (ref 11.5–15.5)
WBC: 6.4 10*3/uL (ref 4.0–10.5)
nRBC: 0 % (ref 0.0–0.2)

## 2019-12-02 LAB — URINALYSIS, ROUTINE W REFLEX MICROSCOPIC
Bilirubin Urine: NEGATIVE
Glucose, UA: NEGATIVE mg/dL
Ketones, ur: 5 mg/dL — AB
Nitrite: POSITIVE — AB
Protein, ur: 30 mg/dL — AB
Specific Gravity, Urine: 1.019 (ref 1.005–1.030)
WBC, UA: >50 WBC/hpf — ABNORMAL HIGH (ref 0–5)
pH: 5 (ref 5.0–8.0)

## 2019-12-02 MED ORDER — SODIUM CHLORIDE 0.9 % IV SOLN
1.0000 g | Freq: Once | INTRAVENOUS | Status: AC
  Administered 2019-12-02: 1 g via INTRAVENOUS
  Filled 2019-12-02: qty 10

## 2019-12-02 MED ORDER — CEPHALEXIN 500 MG PO CAPS: 500.0000 mg | ORAL_CAPSULE | Freq: Two times a day (BID) | ORAL | 0 refills | Status: AC

## 2019-12-02 NOTE — ED Notes (Signed)
Pt left with [name] back to [facility].

## 2019-12-02 NOTE — ED Triage Notes (Signed)
Pt brought to ED via [facility] for R sided pelvis and hip pain since last night. +pedal pulse noted. CBG 124. Wheelchair bound.

## 2019-12-02 NOTE — ED Notes (Signed)
Awaiting EMS transport. ED NT assisted pt to change back into clothes. nad noted.

## 2019-12-02 NOTE — ED Provider Notes (Signed)
[facility] EMERGENCY DEPARTMENT Provider Note  CSN: [id number] Arrival date & time: 12/02/19 8144    History Chief Complaint  Patient presents with  . Hip Pain    HPI  Tamara [name] is a 86 y.o. female brought to the ED via EMS from local ALF. Tamara [name] does not provide history. Per EMS, they were called out for R hip pain no reported falls. I spoke with staff at [name] facility who report Tamara [name] was yelling out in pain this morning and holding Tamara [name] R hip. Tamara [name] is mostly wheelchair bound, requires assistance to transition from recliner to wheelchair. Has not been eating or drinking much lately, Tamara [name] has required more assistance than usual. Tamara [name] typically rests and sleeps in a recliner due to kyphosis, does not spend much time in bed. Tamara [name] is usually conversant but demented, however Tamara [name] does have periods of confusion.    Past Medical History:  Diagnosis Date  . Acute colitis 09/2011   C. difficile negative  . Arthritis   . Atrial fibrillation (HCC)    Onset well before 2004; Negative stress nuclear study in 01/2003  . Back pain   . Chronic anticoagulation   . Dementia (HCC)   . Depression   . Fractures   . GERD (gastroesophageal reflux disease)   . Hiatal hernia    by CT in 2013; also noted was an adrenal adenoma, duodenal lipoma right ovarian cyst, post hysterectomy  . History of spinal fracture   . Hypercalcemia   . Hyperlipidemia   . Hypertension   . Osteoporosis   . Pericardial effusion   . Pneumonia 09/2011   09/2011  . Syncope   . Thyroid disease    hypothyroid    Past Surgical History:  Procedure Laterality Date  . ABDOMINAL HYSTERECTOMY     No oophorectomy  . APPENDECTOMY    . COLONOSCOPY  2005   Reportedly normal screening study    Family History  Problem Relation Age of Onset  . Cancer Mother        kidney  . Heart attack Father   . Hypertension Father     Social History   Tobacco Use  . Smoking status: Never Smoker  . Smokeless tobacco: Never Used    Substance Use Topics  . Alcohol use: No  . Drug use: No     Home Medications Prior to Admission medications   Medication Sig Start Date End Date Taking? Authorizing Provider  acetaminophen (TYLENOL) 500 MG tablet Take 1,000 mg by mouth every 8 (eight) hours as needed for mild pain or moderate pain.    Yes [provider]  diltiazem (CARDIZEM) 30 MG tablet Take 30 mg by mouth daily.    Yes [provider]  docusate sodium 100 MG CAPS Take 100 mg by mouth 2 (two) times daily. 02/26/13  Yes Dellinger, Tora Kindred, PA-C  levothyroxine (SYNTHROID, LEVOTHROID) 75 MCG tablet Take 75 mcg by mouth daily before breakfast.   Yes [provider]  mirtazapine (REMERON) 15 MG tablet Take 15 mg by mouth at bedtime. Appetite stimulation  10/03/19  Yes [provider]  omeprazole (PRILOSEC) 40 MG capsule Take 1 capsule (40 mg total) by mouth daily. For heartburn 05/14/12  Yes Sharon Seller, NP  senna (SENOKOT) 8.6 MG TABS tablet Take 8.6 mg by mouth daily.   Yes [provider]  aspirin EC 81 MG tablet Take 81 mg by mouth daily. For pain    [provider]  Calcium  Carbonate-Vitamin D (CALCIUM 600+D) 600-400 MG-UNIT tablet Take 1 tablet by mouth daily. Patient not taking: Reported on 12/02/2019    [provider]  cephALEXin (KEFLEX) 500 MG capsule Take 1 capsule (500 mg total) by mouth 2 (two) times daily. 12/02/19   Pollyann SavoySheldon, Erasmo Vertz B, MD  ENSURE (ENSURE) Take 237 mLs by mouth daily.    [provider]  escitalopram (LEXAPRO) 5 MG tablet Take 5 mg by mouth daily.  Patient not taking: Reported on 12/02/2019 07/29/17   [provider]  loperamide (IMODIUM A-D) 2 MG tablet Take 2 mg by mouth 4 (four) times daily as needed for diarrhea or loose stools. Patient not taking: Reported on 12/02/2019    [provider]  ondansetron (ZOFRAN) 4 MG tablet Take 4 mg by mouth every 8 (eight) hours as needed for nausea or  vomiting. Patient not taking: Reported on 12/02/2019    [provider]  Polyethyl Glycol-Propyl Glycol (SYSTANE) 0.4-0.3 % SOLN Place 1 drop into both eyes 2 (two) times daily. Patient not taking: Reported on 12/02/2019    [provider]  traMADol (ULTRAM) 50 MG tablet Take by mouth every 6 (six) hours as needed for moderate pain. Patient not taking: Reported on 12/02/2019    [provider]  WARFARIN SODIUM PO Take 4.5 mg by mouth daily. Patient not taking: Reported on 12/02/2019    [provider]     Allergies    Patient has no known allergies.   Review of Systems   Review of Systems Unable to assess due to mental status.    Physical Exam BP 140/83   Pulse 76   Temp 97.8 F (36.6 C) (Oral)   Resp 16   Ht 5\' 4"  (1.626 m)   Wt 48.6 kg   SpO2 100%   BMI 18.40 kg/m   Physical Exam Vitals and nursing note reviewed.  Constitutional:      Appearance: Normal appearance.     Comments: Somnolent, arouses to verbal stimuli and follows commands but does not speak  HENT:     Head: Normocephalic and atraumatic.     Nose: Nose normal.     Mouth/Throat:     Mouth: Mucous membranes are moist.  Eyes:     Extraocular Movements: Extraocular movements intact.     Conjunctiva/sclera: Conjunctivae normal.  Cardiovascular:     Rate and Rhythm: Normal rate.  Pulmonary:     Effort: Pulmonary effort is normal.     Breath sounds: Normal breath sounds.  Abdominal:     General: Abdomen is flat.     Palpations: Abdomen is soft.     Tenderness: There is no abdominal tenderness.  Musculoskeletal:        General: No swelling. Normal range of motion.     Cervical back: Neck supple.     Comments: Mild pain with ROM of R hip, mild shortening but patient is positioned on Tamara [name] R side for comfort.   Skin:    General: Skin is warm and dry.  Neurological:     General: No focal deficit present.     Mental Status: Tamara [name] is disoriented.     Comments: Moves all  extremities to command  Psychiatric:        Mood and Affect: Mood normal.      ED Results / Procedures / Treatments   Labs (all labs ordered are listed, but only abnormal results are displayed) Labs Reviewed  URINALYSIS, ROUTINE W REFLEX MICROSCOPIC - Abnormal; Notable  for the following components:      Result Value   Color, Urine AMBER (*)    APPearance HAZY (*)    Hgb urine dipstick LARGE (*)    Ketones, ur 5 (*)    Protein, ur 30 (*)    Nitrite POSITIVE (*)    Leukocytes,Ua LARGE (*)    WBC, UA >50 (*)    Bacteria, UA MANY (*)    All other components within normal limits  COMPREHENSIVE METABOLIC PANEL - Abnormal; Notable for the following components:   Calcium 8.6 (*)    Total Protein 6.3 (*)    All other components within normal limits  URINE CULTURE  CBC WITH DIFFERENTIAL/PLATELET    EKG EKG Interpretation  Date/Time:  Monday December 02 2019 09:21:45 EDT Ventricular Rate:  65 PR Interval:    QRS Duration: 87 QT Interval:  425 QTC Calculation: 442 R Axis:   -19 Text Interpretation: Atrial fibrillation Borderline left axis deviation Anterolateral infarct, age indeterminate No significant change since last tracing Confirmed by Susy Frizzle [phone]) on 12/02/2019 9:24:56 AM   Radiology DG Hip Unilat W or Wo Pelvis 2-3 Views Right  Result Date: 12/02/2019 CLINICAL DATA:  Right hip pain. EXAM: DG HIP (WITH OR WITHOUT PELVIS) 2-3V RIGHT COMPARISON:  Pelvic radiograph 04/27/2016 FINDINGS: No acute fracture or dislocation is identified. Moderate to severe right hip joint space narrowing is similar to the prior study, as is milder left hip joint space narrowing. There is associated marginal osteophytosis involving both femoral heads. The bones are diffusely osteopenic. IMPRESSION: Bilateral hip osteoarthrosis without acute osseous abnormality. Electronically Signed   By: Sebastian Ache M.D.   On: 12/02/2019 09:56    Procedures Procedures  Medications Ordered in the  ED Medications  cefTRIAXone (ROCEPHIN) 1 g in sodium chloride 0.9 % 100 mL IVPB (1 g Intravenous New Bag/Given 12/02/19 1224)     MDM Rules/Calculators/A&P MDM Patient here with R Hip pain at ALF. Staff there are also concerned about Tamara [name] lack of PO intake. Tamara [name] has history of UTI as well. Vitals are stable today.  ED Course  I have reviewed the triage vital signs and the nursing notes.  Pertinent labs & imaging results that were available during my care of the patient were reviewed by me and considered in my medical decision making (see chart for details).  Clinical Course as of Dec 01 1237  Mon Dec 02, 2019  3009 CBC is normal.    [CS]  1008 CMP is normal.    [CS]  1008 Xray shows arthritis, but no acute findings.    [CS]  1135 UA shows UTI, will send for culture. Most recent urine culture showed Klebsiella, sensitive to ceftriaxone.    [CS]  1215 Spoke with patient's Daughter-in-law, Tamara [name], who reports the family has been considering upgrading [name] from ALF to SNF recently. I do not feel that the patient requires admission today so Tamara [name] requests that we return the patient to [name] and Tamara [name] will being the process of getting Tamara [name] into a SNF.    [CS]    Clinical Course User Index [CS] Pollyann Savoy, MD    Final Clinical Impression(s) / ED Diagnoses Final diagnoses:  Osteoarthritis of both hips, unspecified osteoarthritis type  Acute cystitis without hematuria    Rx / DC Orders ED Discharge Orders         Ordered    cephALEXin (KEFLEX) 500 MG capsule  2 times daily  12/02/19 1219           Pollyann Savoy, MD 12/02/19 1239

## 2019-12-02 NOTE — ED Notes (Signed)
Report called to facility, [facility]. EMS called by unit secretary for transport back to facility.

## 2019-12-03 LAB — URINE CULTURE

## 2019-12-27 ENCOUNTER — Emergency Department (HOSPITAL_COMMUNITY): Payer: Medicare PPO

## 2019-12-27 ENCOUNTER — Encounter (HOSPITAL_COMMUNITY): Payer: Self-pay

## 2019-12-27 ENCOUNTER — Emergency Department (HOSPITAL_COMMUNITY)
Admission: EM | Admit: 2019-12-27 | Discharge: 2019-12-27 | Disposition: A | Discharge status: Skilled Nursing Facility | Payer: Medicare PPO | Attending: Emergency Medicine | Admitting: Emergency Medicine

## 2019-12-27 ENCOUNTER — Other Ambulatory Visit: Payer: Self-pay

## 2019-12-27 DIAGNOSIS — I4891 Unspecified atrial fibrillation: Secondary | ICD-10-CM | POA: Diagnosis not present

## 2019-12-27 DIAGNOSIS — S60945A Unspecified superficial injury of left ring finger, initial encounter: Secondary | ICD-10-CM | POA: Diagnosis present

## 2019-12-27 DIAGNOSIS — Y92129 Unspecified place in nursing home as the place of occurrence of the external cause: Secondary | ICD-10-CM | POA: Insufficient documentation

## 2019-12-27 DIAGNOSIS — Z79899 Other long term (current) drug therapy: Secondary | ICD-10-CM | POA: Insufficient documentation

## 2019-12-27 DIAGNOSIS — S6992XA Unspecified injury of left wrist, hand and finger(s), initial encounter: Secondary | ICD-10-CM

## 2019-12-27 DIAGNOSIS — X58XXXA Exposure to other specified factors, initial encounter: Secondary | ICD-10-CM | POA: Diagnosis not present

## 2019-12-27 DIAGNOSIS — F0390 Unspecified dementia without behavioral disturbance: Secondary | ICD-10-CM | POA: Diagnosis not present

## 2019-12-27 DIAGNOSIS — S60042A Contusion of left ring finger without damage to nail, initial encounter: Secondary | ICD-10-CM | POA: Diagnosis not present

## 2019-12-27 DIAGNOSIS — Z7901 Long term (current) use of anticoagulants: Secondary | ICD-10-CM | POA: Diagnosis not present

## 2019-12-27 DIAGNOSIS — Z7982 Long term (current) use of aspirin: Secondary | ICD-10-CM | POA: Insufficient documentation

## 2019-12-27 DIAGNOSIS — E039 Hypothyroidism, unspecified: Secondary | ICD-10-CM | POA: Diagnosis not present

## 2019-12-27 NOTE — ED Triage Notes (Signed)
Pt came from [facility] [place] due to bruising , pain and swelling to left ring finger. Reported by staff that pt fell a few days ago and noticed bruising today Pt has dementia

## 2019-12-27 NOTE — ED Provider Notes (Signed)
[facility] EMERGENCY DEPARTMENT Provider Note   CSN: [id number] Arrival date & time: 12/27/19  1643     History Chief Complaint  Patient presents with  . Finger Injury    Tamara [name] is a 86 y.o. female.  With a past medical history of dementia was sent in from [facility] [facility] nursing home for evaluation of left finger bruising.  The patient is unable to provide a history.  According to nurse triage nurse he was sent in for bruising to the left ring finger of unknown time.  No other information available at this time.  She is on chronic anticoagulation with Coumadin for A. fib.  HPI     Past Medical History:  Diagnosis Date  . Acute colitis 09/2011   C. difficile negative  . Arthritis   . Atrial fibrillation (HCC)    Onset well before 2004; Negative stress nuclear study in 01/2003  . Back pain   . Chronic anticoagulation   . Dementia (HCC)   . Depression   . Fractures   . GERD (gastroesophageal reflux disease)   . Hiatal hernia    by CT in 2013; also noted was an adrenal adenoma, duodenal lipoma right ovarian cyst, post hysterectomy  . History of spinal fracture   . Hypercalcemia   . Hyperlipidemia   . Hypertension   . Osteoporosis   . Pericardial effusion   . Pneumonia 09/2011   09/2011  . Syncope   . Thyroid disease    hypothyroid    Patient Active Problem List   Diagnosis Date Noted  . UTI (urinary tract infection) 01/11/2016  . Dementia (HCC) 01/11/2016  . Hypothyroidism 01/11/2016  . GERD (gastroesophageal reflux disease) 01/11/2016  . Gram-negative bacteremia 05/28/2014  . UTI (lower urinary tract infection) 05/28/2014  . Sepsis (HCC) 05/28/2014  . Hypokalemia 02/25/2013  . Hematuria 02/25/2013  . Fever 02/24/2013  . Acute bronchitis 02/24/2013  . Back pain 02/24/2013  . Left wrist fracture 02/24/2013  . Influenza due to identified novel influenza A virus with other respiratory manifestations 02/24/2013  . Degenerative joint disease 08/17/2012  .  Hypercalcemia 02/29/2012  . Acute pericardial effusion 02/25/2012  . Hypertension   . Chronic anticoagulation   . Hyperlipidemia   . Atrial fibrillation (HCC) 09/28/2011  . Acute encephalopathy 09/28/2011    Past Surgical History:  Procedure Laterality Date  . ABDOMINAL HYSTERECTOMY     No oophorectomy  . APPENDECTOMY    . COLONOSCOPY  2005   Reportedly normal screening study     OB History    Gravida  2   Para  2   Term  2   Preterm      AB      Living        SAB      TAB      Ectopic      Multiple      Live Births              Family History  Problem Relation Age of Onset  . Cancer Mother        kidney  . Heart attack Father   . Hypertension Father     Social History   Tobacco Use  . Smoking status: Never Smoker  . Smokeless tobacco: Never Used  Substance Use Topics  . Alcohol use: No  . Drug use: No    Home Medications Prior to Admission medications   Medication Sig Start Date End Date Taking? Authorizing Provider  acetaminophen (TYLENOL) 500 MG tablet Take 1,000 mg by mouth every 8 (eight) hours as needed for mild pain or moderate pain.     [provider]  aspirin EC 81 MG tablet Take 81 mg by mouth daily. For pain    [provider]  Calcium Carbonate-Vitamin D (CALCIUM 600+D) 600-400 MG-UNIT tablet Take 1 tablet by mouth daily. Patient not taking: Reported on 12/02/2019    [provider]  cephALEXin (KEFLEX) 500 MG capsule Take 1 capsule (500 mg total) by mouth 2 (two) times daily. 12/02/19   Pollyann Savoy, MD  diltiazem (CARDIZEM) 30 MG tablet Take 30 mg by mouth daily.     [provider]  docusate sodium 100 MG CAPS Take 100 mg by mouth 2 (two) times daily. 02/26/13   Dellinger, Tora Kindred, PA-C  ENSURE (ENSURE) Take 237 mLs by mouth daily.    [provider]  escitalopram (LEXAPRO) 5 MG tablet Take 5 mg by mouth daily.  Patient not taking: Reported on 12/02/2019 07/29/17   [provider]  levothyroxine (SYNTHROID, LEVOTHROID) 75 MCG tablet Take 75 mcg by mouth daily before breakfast.    [provider]  loperamide (IMODIUM A-D) 2 MG tablet Take 2 mg by mouth 4 (four) times daily as needed for diarrhea or loose stools. Patient not taking: Reported on 12/02/2019    [provider]  mirtazapine (REMERON) 15 MG tablet Take 15 mg by mouth at bedtime. Appetite stimulation  10/03/19   [provider]  omeprazole (PRILOSEC) 40 MG capsule Take 1 capsule (40 mg total) by mouth daily. For heartburn 05/14/12   Sharon Seller, NP  ondansetron (ZOFRAN) 4 MG tablet Take 4 mg by mouth every 8 (eight) hours as needed for nausea or vomiting. Patient not taking: Reported on 12/02/2019    [provider]  Polyethyl Glycol-Propyl Glycol (SYSTANE) 0.4-0.3 % SOLN Place 1 drop into both eyes 2 (two) times daily. Patient not taking: Reported on 12/02/2019    [provider]  senna (SENOKOT) 8.6 MG TABS tablet Take 8.6 mg by mouth daily.    [provider]  traMADol (ULTRAM) 50 MG tablet Take by mouth every 6 (six) hours as needed for moderate pain. Patient not taking: Reported on 12/02/2019    [provider]  WARFARIN SODIUM PO Take 4.5 mg by mouth daily. Patient not taking: Reported on 12/02/2019    [provider]    Allergies    Patient has no known allergies.  Review of Systems   Review of Systems  Unable to perform ROS: Dementia    Physical Exam Updated Vital Signs BP (!) 170/94 (BP Location: Right Arm)   Pulse 92   Temp 99.3 F (37.4 C) (Oral)   Resp 16   SpO2 100%   Physical Exam Vitals and nursing note reviewed.  Constitutional:      General: She is not in acute distress.    Appearance: She is well-developed. She is not diaphoretic.  HENT:     Head: Normocephalic and atraumatic.  Eyes:     General: No scleral icterus.    Conjunctiva/sclera: Conjunctivae normal.  Cardiovascular:      Rate and Rhythm: Normal rate and regular rhythm.     Heart sounds: Normal heart sounds. No murmur heard.  No friction rub. No gallop.   Pulmonary:     Effort: Pulmonary effort is normal. No respiratory distress.     Breath sounds: Normal breath sounds.  Abdominal:  General: Bowel sounds are normal. There is no distension.     Palpations: Abdomen is soft. There is no mass.     Tenderness: There is no abdominal tenderness. There is no guarding.  Musculoskeletal:     Cervical back: Normal range of motion.     Comments: Left ring finger with deep purple ecchymosis. Pain with passive ROM  Skin:    General: Skin is warm and dry.  Neurological:     Mental Status: She is alert and oriented to person, place, and time.  Psychiatric:        Behavior: Behavior normal.     ED Results / Procedures / Treatments   Labs (all labs ordered are listed, but only abnormal results are displayed) Labs Reviewed - No data to display  EKG None  Radiology No results found.  Procedures Procedures (including critical care time)  Medications Ordered in ED Medications - No data to display  ED Course  I have reviewed the triage vital signs and the nursing notes.  Pertinent labs & imaging results that were available during my care of the patient were reviewed by me and considered in my medical decision making (see chart for details).    MDM Rules/Calculators/A&P                          Patient X-Ray negative for obvious fracture or dislocation.. conservative therapy recommended  Patient will be dc to snf . Final Clinical Impression(s) / ED Diagnoses Final diagnoses:  None    Rx / DC Orders ED Discharge Orders    None       Arthor Captain, PA-C 12/27/19 2101    Bethann Berkshire, MD 12/27/19 2230

## 2019-12-27 NOTE — Discharge Instructions (Addendum)
No fractures noted to the left ring finger

## 2020-02-22 DEATH — deceased
